# Patient Record
Sex: Male | Born: 1950
Health system: Southern US, Community
[De-identification: ages and names within clinical notes are randomized; demographics above are authoritative.]

## PROBLEM LIST (undated history)

## (undated) ENCOUNTER — Emergency Department (HOSPITAL_COMMUNITY): Admission: EM | Payer: Medicare HMO | Source: Home / Self Care

## (undated) DIAGNOSIS — K219 Gastro-esophageal reflux disease without esophagitis: Secondary | ICD-10-CM

## (undated) DIAGNOSIS — E7800 Pure hypercholesterolemia, unspecified: Secondary | ICD-10-CM

## (undated) DIAGNOSIS — E119 Type 2 diabetes mellitus without complications: Secondary | ICD-10-CM

## (undated) DIAGNOSIS — E785 Hyperlipidemia, unspecified: Secondary | ICD-10-CM

## (undated) DIAGNOSIS — D126 Benign neoplasm of colon, unspecified: Secondary | ICD-10-CM

## (undated) DIAGNOSIS — I10 Essential (primary) hypertension: Secondary | ICD-10-CM

## (undated) DIAGNOSIS — M549 Dorsalgia, unspecified: Secondary | ICD-10-CM

## (undated) DIAGNOSIS — M109 Gout, unspecified: Secondary | ICD-10-CM

## (undated) DIAGNOSIS — G8929 Other chronic pain: Secondary | ICD-10-CM

## (undated) DIAGNOSIS — G4730 Sleep apnea, unspecified: Secondary | ICD-10-CM

## (undated) DIAGNOSIS — I2510 Atherosclerotic heart disease of native coronary artery without angina pectoris: Secondary | ICD-10-CM

## (undated) DIAGNOSIS — F419 Anxiety disorder, unspecified: Secondary | ICD-10-CM

## (undated) DIAGNOSIS — N281 Cyst of kidney, acquired: Secondary | ICD-10-CM

## (undated) DIAGNOSIS — G2581 Restless legs syndrome: Secondary | ICD-10-CM

## (undated) DIAGNOSIS — K449 Diaphragmatic hernia without obstruction or gangrene: Secondary | ICD-10-CM

## (undated) DIAGNOSIS — M1990 Unspecified osteoarthritis, unspecified site: Secondary | ICD-10-CM

## (undated) HISTORY — DX: Gout, unspecified: M10.9

## (undated) HISTORY — DX: Benign neoplasm of colon, unspecified: D12.6

## (undated) HISTORY — DX: Diaphragmatic hernia without obstruction or gangrene: K44.9

## (undated) HISTORY — DX: Other chronic pain: G89.29

## (undated) HISTORY — PX: VASECTOMY: SHX75

## (undated) HISTORY — DX: Pure hypercholesterolemia, unspecified: E78.00

## (undated) HISTORY — DX: Gastro-esophageal reflux disease without esophagitis: K21.9

## (undated) HISTORY — DX: Sleep apnea, unspecified: G47.30

## (undated) HISTORY — DX: Hyperlipidemia, unspecified: E78.5

## (undated) HISTORY — DX: Cyst of kidney, acquired: N28.1

## (undated) HISTORY — DX: Dorsalgia, unspecified: M54.9

## (undated) HISTORY — DX: Unspecified osteoarthritis, unspecified site: M19.90

## (undated) HISTORY — DX: Anxiety disorder, unspecified: F41.9

## (undated) HISTORY — DX: Essential (primary) hypertension: I10

---
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
## 1993-05-01 HISTORY — PX: KNEE SURGERY: SHX244

## 2002-05-01 HISTORY — PX: CARDIOVASCULAR STRESS TEST: SHX262

## 2005-03-02 ENCOUNTER — Ambulatory Visit: Payer: Self-pay | Admitting: Family Medicine

## 2005-03-22 ENCOUNTER — Ambulatory Visit: Payer: Self-pay | Admitting: Family Medicine

## 2005-05-11 ENCOUNTER — Ambulatory Visit: Payer: Self-pay | Admitting: Family Medicine

## 2005-06-15 ENCOUNTER — Ambulatory Visit: Payer: Self-pay | Admitting: Family Medicine

## 2005-08-31 ENCOUNTER — Ambulatory Visit: Payer: Self-pay | Admitting: Family Medicine

## 2005-11-08 ENCOUNTER — Ambulatory Visit: Payer: Self-pay | Admitting: Family Medicine

## 2005-12-26 ENCOUNTER — Ambulatory Visit: Payer: Self-pay | Admitting: Family Medicine

## 2006-02-06 DIAGNOSIS — G4700 Insomnia, unspecified: Secondary | ICD-10-CM | POA: Insufficient documentation

## 2006-02-06 DIAGNOSIS — E7800 Pure hypercholesterolemia, unspecified: Secondary | ICD-10-CM | POA: Insufficient documentation

## 2006-02-06 DIAGNOSIS — I10 Essential (primary) hypertension: Secondary | ICD-10-CM | POA: Insufficient documentation

## 2006-03-06 ENCOUNTER — Encounter: Payer: Self-pay | Admitting: Family Medicine

## 2006-03-06 ENCOUNTER — Ambulatory Visit: Payer: Self-pay | Admitting: Family Medicine

## 2006-06-06 ENCOUNTER — Telehealth: Payer: Self-pay | Admitting: Family Medicine

## 2006-06-13 ENCOUNTER — Ambulatory Visit: Payer: Self-pay | Admitting: Family Medicine

## 2006-06-14 ENCOUNTER — Telehealth (INDEPENDENT_AMBULATORY_CARE_PROVIDER_SITE_OTHER): Payer: Self-pay | Admitting: *Deleted

## 2006-06-14 LAB — CONVERTED CEMR LAB
ALT: 53 units/L (ref 0–53)
AST: 28 units/L (ref 0–37)
Alkaline Phosphatase: 58 units/L (ref 39–117)
Basophils Absolute: 0 10*3/uL (ref 0.0–0.1)
Basophils Relative: 0 % (ref 0–1)
Calcium: 9.7 mg/dL (ref 8.4–10.5)
Chloride: 102 meq/L (ref 96–112)
Creatinine, Ser: 0.88 mg/dL (ref 0.40–1.50)
Eosinophils Absolute: 0.1 10*3/uL (ref 0.0–0.7)
LDL Cholesterol: 110 mg/dL — ABNORMAL HIGH (ref 0–99)
Lipase: 25 units/L (ref 0–75)
MCHC: 32.3 g/dL (ref 30.0–36.0)
Monocytes Absolute: 0.5 10*3/uL (ref 0.2–0.7)
Neutro Abs: 8.2 10*3/uL — ABNORMAL HIGH (ref 1.7–7.7)
Neutrophils Relative %: 81 % — ABNORMAL HIGH (ref 43–77)
Potassium: 3.9 meq/L (ref 3.5–5.3)
RDW: 13.7 % (ref 11.5–14.0)
Total CHOL/HDL Ratio: 3.7
VLDL: 28 mg/dL (ref 0–40)

## 2006-07-23 ENCOUNTER — Telehealth: Payer: Self-pay | Admitting: Family Medicine

## 2006-07-31 HISTORY — PX: CHOLECYSTECTOMY: SHX55

## 2006-08-08 ENCOUNTER — Telehealth: Payer: Self-pay | Admitting: Family Medicine

## 2006-08-08 ENCOUNTER — Ambulatory Visit: Payer: Self-pay | Admitting: Family Medicine

## 2006-08-08 DIAGNOSIS — R1013 Epigastric pain: Secondary | ICD-10-CM | POA: Insufficient documentation

## 2006-08-09 ENCOUNTER — Telehealth: Payer: Self-pay | Admitting: Family Medicine

## 2006-08-09 ENCOUNTER — Encounter: Payer: Self-pay | Admitting: Family Medicine

## 2006-08-09 LAB — CONVERTED CEMR LAB
ALT: 73 units/L — ABNORMAL HIGH (ref 0–53)
Albumin: 4.4 g/dL (ref 3.5–5.2)
Basophils Absolute: 0 10*3/uL (ref 0.0–0.1)
CO2: 29 meq/L (ref 19–32)
Chloride: 99 meq/L (ref 96–112)
Glucose, Bld: 116 mg/dL — ABNORMAL HIGH (ref 70–99)
HCT: 44.6 % (ref 39.0–52.0)
Lymphocytes Relative: 14 % (ref 12–46)
Lymphs Abs: 1.8 10*3/uL (ref 0.7–3.3)
Neutro Abs: 9.6 10*3/uL — ABNORMAL HIGH (ref 1.7–7.7)
Neutrophils Relative %: 74 % (ref 43–77)
Platelets: 253 10*3/uL (ref 150–400)
Potassium: 3.4 meq/L — ABNORMAL LOW (ref 3.5–5.3)
RDW: 13.4 % (ref 11.5–14.0)
Sodium: 136 meq/L (ref 135–145)
Total Protein: 7.1 g/dL (ref 6.0–8.3)
WBC: 13 10*3/uL — ABNORMAL HIGH (ref 4.0–10.5)

## 2006-08-11 ENCOUNTER — Encounter (INDEPENDENT_AMBULATORY_CARE_PROVIDER_SITE_OTHER): Payer: Self-pay | Admitting: Specialist

## 2006-08-12 ENCOUNTER — Inpatient Hospital Stay (HOSPITAL_COMMUNITY): Admission: RE | Admit: 2006-08-12 | Discharge: 2006-08-13 | Payer: Self-pay | Admitting: General Surgery

## 2006-10-29 ENCOUNTER — Ambulatory Visit: Payer: Self-pay | Admitting: Family Medicine

## 2006-11-26 ENCOUNTER — Telehealth: Payer: Self-pay | Admitting: Family Medicine

## 2007-02-21 ENCOUNTER — Telehealth: Payer: Self-pay | Admitting: Family Medicine

## 2007-04-04 ENCOUNTER — Telehealth: Payer: Self-pay | Admitting: Family Medicine

## 2007-04-08 ENCOUNTER — Ambulatory Visit: Payer: Self-pay | Admitting: Family Medicine

## 2007-04-19 ENCOUNTER — Telehealth: Payer: Self-pay | Admitting: Family Medicine

## 2007-06-05 ENCOUNTER — Ambulatory Visit: Payer: Self-pay | Admitting: Family Medicine

## 2007-07-12 ENCOUNTER — Encounter: Payer: Self-pay | Admitting: Family Medicine

## 2007-09-04 ENCOUNTER — Ambulatory Visit: Payer: Self-pay | Admitting: Family Medicine

## 2008-01-16 ENCOUNTER — Ambulatory Visit: Payer: Self-pay | Admitting: Family Medicine

## 2008-01-16 DIAGNOSIS — R5383 Other fatigue: Secondary | ICD-10-CM

## 2008-01-16 DIAGNOSIS — R5381 Other malaise: Secondary | ICD-10-CM | POA: Insufficient documentation

## 2008-01-17 LAB — CONVERTED CEMR LAB
ALT: 31 units/L (ref 0–53)
Albumin: 4.5 g/dL (ref 3.5–5.2)
Alkaline Phosphatase: 51 units/L (ref 39–117)
CO2: 22 meq/L (ref 19–32)
Cholesterol: 181 mg/dL (ref 0–200)
Glucose, Bld: 94 mg/dL (ref 70–99)
LDL Cholesterol: 85 mg/dL (ref 0–99)
Lipase: 28 units/L (ref 0–75)
MCV: 84.2 fL (ref 78.0–100.0)
Platelets: 264 10*3/uL (ref 150–400)
Potassium: 3.8 meq/L (ref 3.5–5.3)
RBC: 5.57 M/uL (ref 4.22–5.81)
Sodium: 141 meq/L (ref 135–145)
TSH: 1.357 microintl units/mL (ref 0.350–4.50)
Total Bilirubin: 0.8 mg/dL (ref 0.3–1.2)
Total Protein: 7.1 g/dL (ref 6.0–8.3)
Triglycerides: 248 mg/dL — ABNORMAL HIGH (ref <150)
VLDL: 50 mg/dL — ABNORMAL HIGH (ref 0–40)
Vitamin B-12: 567 pg/mL (ref 211–911)
WBC: 6.3 10*3/uL (ref 4.0–10.5)

## 2008-01-22 ENCOUNTER — Encounter: Admission: RE | Admit: 2008-01-22 | Discharge: 2008-01-22 | Payer: Self-pay | Admitting: Family Medicine

## 2008-01-22 ENCOUNTER — Encounter: Payer: Self-pay | Admitting: Family Medicine

## 2008-02-04 ENCOUNTER — Telehealth: Payer: Self-pay | Admitting: Family Medicine

## 2008-05-01 HISTORY — PX: CARDIOVASCULAR STRESS TEST: SHX262

## 2008-05-28 ENCOUNTER — Telehealth: Payer: Self-pay | Admitting: Family Medicine

## 2008-06-24 ENCOUNTER — Ambulatory Visit: Payer: Self-pay | Admitting: Family Medicine

## 2008-06-24 ENCOUNTER — Encounter: Admission: RE | Admit: 2008-06-24 | Discharge: 2008-06-24 | Payer: Self-pay | Admitting: Family Medicine

## 2008-06-24 DIAGNOSIS — R0789 Other chest pain: Secondary | ICD-10-CM | POA: Insufficient documentation

## 2008-06-24 DIAGNOSIS — R1011 Right upper quadrant pain: Secondary | ICD-10-CM | POA: Insufficient documentation

## 2008-06-24 DIAGNOSIS — N281 Cyst of kidney, acquired: Secondary | ICD-10-CM | POA: Insufficient documentation

## 2008-06-24 DIAGNOSIS — R209 Unspecified disturbances of skin sensation: Secondary | ICD-10-CM | POA: Insufficient documentation

## 2008-06-24 DIAGNOSIS — R0989 Other specified symptoms and signs involving the circulatory and respiratory systems: Secondary | ICD-10-CM | POA: Insufficient documentation

## 2008-06-25 LAB — CONVERTED CEMR LAB
ALT: 30 U/L
AST: 22 U/L
Albumin: 4.5 g/dL
Alkaline Phosphatase: 52 U/L
BUN: 8 mg/dL
Basophils Absolute: 0 K/uL
Basophils Relative: 1 %
CO2: 23 meq/L
Calcium: 9.1 mg/dL
Chloride: 105 meq/L
Creatinine, Ser: 0.88 mg/dL
Eosinophils Absolute: 0.1 K/uL
Eosinophils Relative: 2 %
Glucose, Bld: 96 mg/dL
HCT: 46.4 %
Hemoglobin: 15.2 g/dL
Lymphocytes Relative: 30 %
Lymphs Abs: 2 K/uL
MCHC: 32.8 g/dL
MCV: 81.4 fL
Monocytes Absolute: 0.5 K/uL
Monocytes Relative: 7 %
Neutro Abs: 4 K/uL
Neutrophils Relative %: 61 %
Platelets: 287 K/uL
Potassium: 3.5 meq/L
RBC: 5.7 M/uL
RDW: 13.4 %
Sodium: 143 meq/L
TSH: 1.323 u[IU]/mL
Total Bilirubin: 0.6 mg/dL
Total Protein: 7.3 g/dL
WBC: 6.7 10*3/microliter

## 2008-06-29 ENCOUNTER — Telehealth: Payer: Self-pay | Admitting: Family Medicine

## 2008-06-30 ENCOUNTER — Ambulatory Visit (HOSPITAL_COMMUNITY): Admission: RE | Admit: 2008-06-30 | Discharge: 2008-06-30 | Payer: Self-pay | Admitting: Family Medicine

## 2008-07-07 ENCOUNTER — Ambulatory Visit: Payer: Self-pay | Admitting: Cardiology

## 2008-07-08 ENCOUNTER — Encounter: Payer: Self-pay | Admitting: Cardiology

## 2008-07-08 ENCOUNTER — Ambulatory Visit: Payer: Self-pay | Admitting: Cardiology

## 2008-07-08 ENCOUNTER — Ambulatory Visit (HOSPITAL_COMMUNITY): Admission: RE | Admit: 2008-07-08 | Discharge: 2008-07-08 | Payer: Self-pay | Admitting: Cardiology

## 2008-07-13 ENCOUNTER — Ambulatory Visit: Payer: Self-pay | Admitting: Cardiology

## 2009-01-15 ENCOUNTER — Ambulatory Visit: Payer: Self-pay | Admitting: Family Medicine

## 2009-01-15 LAB — CONVERTED CEMR LAB
Bilirubin Urine: NEGATIVE
Ketones, urine, test strip: NEGATIVE
Specific Gravity, Urine: 1.02
WBC Urine, dipstick: NEGATIVE
pH: 7

## 2009-01-16 ENCOUNTER — Encounter: Payer: Self-pay | Admitting: Family Medicine

## 2009-01-19 ENCOUNTER — Encounter: Payer: Self-pay | Admitting: Family Medicine

## 2009-01-19 LAB — CONVERTED CEMR LAB
ALT: 22 units/L (ref 0–53)
AST: 16 units/L (ref 0–37)
Calcium: 9 mg/dL (ref 8.4–10.5)
Chloride: 105 meq/L (ref 96–112)
Creatinine, Ser: 1.01 mg/dL (ref 0.40–1.50)
Folate: 17.2 ng/mL
PSA: 2.79 ng/mL (ref 0.10–4.00)
Sodium: 143 meq/L (ref 135–145)
Testosterone Free: 93.8 pg/mL (ref 47.0–244.0)
Testosterone-% Free: 2.3 % (ref 1.6–2.9)
Total Bilirubin: 0.6 mg/dL (ref 0.3–1.2)
Total Protein: 7.1 g/dL (ref 6.0–8.3)

## 2009-05-13 ENCOUNTER — Ambulatory Visit: Payer: Self-pay | Admitting: Family Medicine

## 2009-05-13 DIAGNOSIS — E559 Vitamin D deficiency, unspecified: Secondary | ICD-10-CM | POA: Insufficient documentation

## 2009-12-02 ENCOUNTER — Ambulatory Visit: Payer: Self-pay | Admitting: Family Medicine

## 2009-12-02 DIAGNOSIS — F341 Dysthymic disorder: Secondary | ICD-10-CM | POA: Insufficient documentation

## 2009-12-03 ENCOUNTER — Encounter: Payer: Self-pay | Admitting: Family Medicine

## 2009-12-03 LAB — CONVERTED CEMR LAB
AST: 24 units/L (ref 0–37)
Albumin: 4.3 g/dL (ref 3.5–5.2)
Alkaline Phosphatase: 55 units/L (ref 39–117)
Cholesterol, target level: 200 mg/dL
HDL goal, serum: 40 mg/dL
LDL Cholesterol: 132 mg/dL — ABNORMAL HIGH (ref 0–99)
LDL Goal: 100 mg/dL
Potassium: 3.7 meq/L (ref 3.5–5.3)
Sodium: 143 meq/L (ref 135–145)
TSH: 1.355 microintl units/mL (ref 0.350–4.500)
Total Protein: 7.1 g/dL (ref 6.0–8.3)

## 2009-12-16 ENCOUNTER — Telehealth: Payer: Self-pay | Admitting: Family Medicine

## 2010-03-09 ENCOUNTER — Telehealth: Payer: Self-pay | Admitting: Family Medicine

## 2010-03-10 ENCOUNTER — Encounter: Admission: RE | Admit: 2010-03-10 | Discharge: 2010-03-10 | Payer: Self-pay | Admitting: Family Medicine

## 2010-03-10 ENCOUNTER — Ambulatory Visit: Payer: Self-pay | Admitting: Family Medicine

## 2010-03-14 ENCOUNTER — Ambulatory Visit: Payer: Self-pay | Admitting: Family Medicine

## 2010-03-29 ENCOUNTER — Ambulatory Visit: Payer: Self-pay | Admitting: Family Medicine

## 2010-03-29 DIAGNOSIS — M549 Dorsalgia, unspecified: Secondary | ICD-10-CM | POA: Insufficient documentation

## 2010-03-31 ENCOUNTER — Encounter: Payer: Self-pay | Admitting: Family Medicine

## 2010-03-31 ENCOUNTER — Encounter: Admission: RE | Admit: 2010-03-31 | Discharge: 2010-03-31 | Payer: Self-pay | Admitting: Orthopedic Surgery

## 2010-04-14 ENCOUNTER — Encounter: Payer: Self-pay | Admitting: Family Medicine

## 2010-04-16 ENCOUNTER — Encounter
Admission: RE | Admit: 2010-04-16 | Discharge: 2010-04-16 | Payer: Self-pay | Source: Home / Self Care | Attending: Orthopedic Surgery | Admitting: Orthopedic Surgery

## 2010-05-04 ENCOUNTER — Ambulatory Visit
Admission: RE | Admit: 2010-05-04 | Discharge: 2010-05-04 | Payer: Self-pay | Source: Home / Self Care | Attending: Family Medicine | Admitting: Family Medicine

## 2010-05-17 ENCOUNTER — Telehealth: Payer: Self-pay | Admitting: Family Medicine

## 2010-05-20 ENCOUNTER — Encounter: Payer: Self-pay | Admitting: Family Medicine

## 2010-05-23 ENCOUNTER — Telehealth: Payer: Self-pay | Admitting: Family Medicine

## 2010-05-31 NOTE — Assessment & Plan Note (Signed)
Summary: HURTING ACROSS CHEST/BACK VERY SORE-VEW   Vital Signs:  Patient profile:   60 year old male Height:      69 inches Weight:      206 pounds Pulse rate:   58 / minute BP sitting:   160 / 100  (right arm) Cuff size:   regular  Vitals Entered By: Avon Gully CMA, Duncan Dull) (March 14, 2010 1:13 PM) CC: pain across the chest   Primary Care Provider:  Nani Gasser MD  CC:  pain across the chest.  History of Present Illness: Chest pain is Worse when rotatates to the sides.  his pain is about the same.  He does note it is worse with certain activities like raking leaves.  He denies any shortness of breath.  He also had a cardiac workup with stress testing in 2010. I did review his x-ray results with him.  We were unable to get him by phone.  Current Medications (verified): 1)  Ambien 10 Mg Tabs (Zolpidem Tartrate) .... Take 1 Tablet By Mouth Once A Day At Night As Needed For Insomnia 2)  Metoprolol Succinate 100 Mg Xr24h-Tab (Metoprolol Succinate) .... Take 1 Tablet By Mouth Once A Day 3)  Fexofenadine Hcl 180 Mg Tabs (Fexofenadine Hcl) .... Take 1 Tablet By Mouth Once A Day As Needed Allergies 4)  Nexium 40 Mg Cpdr (Esomeprazole Magnesium) .... Take 1 Tablet By Mouth Once A Day 5)  Tribenzor 40-5-25 Mg Tabs (Olmesartan-Amlodipine-Hctz) .... Take 1 Tablet By Mouth Once A Day 6)  Citalopram Hydrobromide 20 Mg Tabs (Citalopram Hydrobromide) .... 1/2 Tab By Mouth Daily For One Week, Then Incrase A Whole. 7)  Lipitor 20 Mg Tabs (Atorvastatin Calcium) .... Take 1 Tablet By Mouth Once A Day At Bedtime 8)  Tramadol Hcl 50 Mg Tabs (Tramadol Hcl) .... Take 1 Tablet By Mouth Once A Day At Bedtime As Needed Severe Pain  Allergies (verified): 1)  Zocor (Simvastatin)  Comments:  Nurse/Medical Assistant: The patient's medications and allergies were reviewed with the patient and were updated in the Medication and Allergy Lists. Avon Gully CMA, Duncan Dull) (March 14, 2010 1:13  PM)  Physical Exam  General:  Well-developed,well-nourished,in no acute distress; alert,appropriate and cooperative throughout examination Chest Wall:  TEnder over  th lower sternum and xiphoid process.  Lungs:  Normal respiratory effort, chest expands symmetrically. Lungs are clear to auscultation, no crackles or wheezes. Heart:  Normal rate and regular rhythm. S1 and S2 normal without gallop, murmur, click, rub or other extra sounds.   Impression & Recommendations:  Problem # 1:  COSTOCHONDRITIS (ICD-733.6) explained that this is likely costochondritis as his pain is exacerbated by rotation of the torso right and left.  I still think he may have some pain coming from his back as well as he also points to his left upper back.  I gave him a handout on costochondritis encouraged use of an anti-inflammatory.    If he is not continuing to get better then I'll refer him to sports medicine orthopedics and two to 3 weeks.  He will call the office at that time.  He had a normal cardiac exam and workup in 2010.  And has a normal cardiac exam today.  It is interesting that some of his pain is near his xiphoid process. He also has a surgical scar near here where his gallbladder was removed.   scar tissue could also be causing some of his discomfort.  Problem # 2:  HYPERTENSION, BENIGN SYSTEMIC (ICD-401.1) BP  148-177/103.  whis blood pressure is still not well controlled.  He has not had a chance to pick up his Tribenzor or if he's been out of 10.  He says he will pick this up and follow up in one month to recheck his blood pressure. His updated medication list for this problem includes:    Metoprolol Succinate 100 Mg Xr24h-tab (Metoprolol succinate) .Marland Kitchen... Take 1 tablet by mouth once a day    Tribenzor 40-5-25 Mg Tabs (Olmesartan-amlodipine-hctz) .Marland Kitchen... Take 1 tablet by mouth once a day  Complete Medication List: 1)  Ambien 10 Mg Tabs (Zolpidem tartrate) .... Take 1 tablet by mouth once a day at night as  needed for insomnia 2)  Metoprolol Succinate 100 Mg Xr24h-tab (Metoprolol succinate) .... Take 1 tablet by mouth once a day 3)  Fexofenadine Hcl 180 Mg Tabs (Fexofenadine hcl) .... Take 1 tablet by mouth once a day as needed allergies 4)  Nexium 40 Mg Cpdr (Esomeprazole magnesium) .... Take 1 tablet by mouth once a day 5)  Tribenzor 40-5-25 Mg Tabs (Olmesartan-amlodipine-hctz) .... Take 1 tablet by mouth once a day 6)  Citalopram Hydrobromide 20 Mg Tabs (Citalopram hydrobromide) .... 1/2 tab by mouth daily for one week, then incrase a whole. 7)  Lipitor 20 Mg Tabs (Atorvastatin calcium) .... Take 1 tablet by mouth once a day at bedtime 8)  Tramadol Hcl 50 Mg Tabs (Tramadol hcl) .... Take 1 tablet by mouth once a day at bedtime as needed severe pain 9)  Diclofenac Sodium 75 Mg Tbec (Diclofenac sodium) .... Take 1 tablet by mouth two times a day for the chest pain   Patient Instructions: 1)  Lay off the salt.  Start the new BP med and recheck in one month. 2)  Can start the diclofenac for possible costochondritis. If not better in 2-3 weeeks then call me and I will set you up with orthopedics downstairs.  3)  Please schedule a follow-up appointment in 1 month to recheck your blood pressure.  Prescriptions: DICLOFENAC SODIUM 75 MG TBEC (DICLOFENAC SODIUM) Take 1 tablet by mouth two times a day for the chest pain  #60 x 0   Entered and Authorized by:   Nani Gasser MD   Signed by:   Nani Gasser MD on 03/14/2010   Method used:   Electronically to        [facility] [address]       [address], [place]  [address]       Ph: [phone]       Fax: [phone]   RxID:   [id number]    Orders Added: 1)  Est. Patient Level III [phone]

## 2010-05-31 NOTE — Progress Notes (Deleted)
Summary: BP Log & Med List Brought by Patient  BP Log & Med List Brought by Patient   Imported By: Lanelle Bal 03/22/2010 11:53:47  _____________________________________________________________________  External Attachment:    Type:   Image     Comment:   External Document

## 2010-05-31 NOTE — Consult Note (Signed)
Summary: [facility]   Imported By: Maryln Gottron 04/08/2010 14:33:44  _____________________________________________________________________  External Attachment:    Type:   Image     Comment:   External Document

## 2010-05-31 NOTE — Assessment & Plan Note (Signed)
Summary: F/U BP, depression   Vital Signs:  Patient profile:   60 year old male Height:      69 inches Weight:      202 pounds BMI:     29.94 Pulse rate:   59 / minute BP sitting:   149 / 86  (left arm) Cuff size:   regular  Vitals Entered By: Avon Gully CMA, Duncan Dull) (December 02, 2009 1:11 PM) CC: f/u BP   Primary Care Provider:  Nani Gasser MD  CC:  f/u BP.  History of Present Illness: Did buy a home BP machine and DBPs have been in the 90s and occ 100.  Tried increased tabs to 1.5 to see if helped his BP and it did bring it to the 80s. No swelling.   Has been feeling more down and anxious.  Wonders if a medicine will help him.  Home life has been stressful. Will start to sweat profusely when he feels anxious.  Getting more frquent HA.  Says costantly feels wound up.  Has trouble concentratint. Says even his wife has commented that he is too anxious.  He says has felt a litlle more down as well. No thoughtsof hurting himself.   Current Medications (verified): 1)  Ambien 10 Mg Tabs (Zolpidem Tartrate) .... Take 1 Tablet By Mouth Once A Day At Night As Needed For Insomnia 2)  Metoprolol Succinate 100 Mg Xr24h-Tab (Metoprolol Succinate) .... Take 1 Tablet By Mouth Once A Day 3)  Fexofenadine Hcl 180 Mg Tabs (Fexofenadine Hcl) .... Take 1 Tablet By Mouth Once A Day As Needed Allergies 4)  Nexium 40 Mg Cpdr (Esomeprazole Magnesium) .... Take 1 Tablet By Mouth Once A Day 5)  Losartan Potassium-Hctz 100-25 Mg Tabs (Losartan Potassium-Hctz) .... Take 1 Tablet By Mouth Once A Day in The Am.  Allergies (verified): No Known Drug Allergies  Comments:  Nurse/Medical Assistant: The patient's medications and allergies were reviewed with the patient and were updated in the Medication and Allergy Lists. Avon Gully CMA, Duncan Dull) (December 02, 2009 1:12 PM)  Physical Exam  General:  Well-developed,well-nourished,in no acute distress; alert,appropriate and cooperative throughout  examination Head:  Normocephalic and atraumatic without obvious abnormalities. No apparent alopecia or balding. Lungs:  Normal respiratory effort, chest expands symmetrically. Lungs are clear to auscultation, no crackles or wheezes. Heart:  Normal rate and regular rhythm. S1 and S2 normal without gallop, murmur, click, rub or other extra sounds. Skin:  no rashes.   Psych:  Cognition and judgment appear intact. Alert and cooperative with normal attention span and concentration. No apparent delusions, illusions, hallucinations   Impression & Recommendations:  Problem # 1:  HYPERTENSION, BENIGN SYSTEMIC (ICD-401.1) Not at goal.  Will start trial of Tribenzor  40/5/25mg . Stop the losartan and continue the metoprolol. F/u in 6 weeks.  Due for labs to check potassium, kidneys, etc.  His updated medication list for this problem includes:    Metoprolol Succinate 100 Mg Xr24h-tab (Metoprolol succinate) .Marland Kitchen... Take 1 tablet by mouth once a day    Losartan Potassium-hctz 100-25 Mg Tabs (Losartan potassium-hctz) .Marland Kitchen... Take 1 tablet by mouth once a day in the am.  Orders: T-Comprehensive Metabolic Panel [phone]) T-Lipid Profile ([id number]) T-TSH ([id number])  Problem # 2:  ANXIETY DEPRESSION (ICD-300.4) Assessment: New PHQ09 score of 18 (moderatly severe).  GAD-7 score of 13.  Discussed treatment with posible medications and counseling. He is open to trying medicaiton. Dsicussed SSRI use and potential SE including inc suicide risk. STart med  and f/u in 4-6 weeks. No suicidal thought.     Complete Medication List: 1)  Ambien 10 Mg Tabs (Zolpidem tartrate) .... Take 1 tablet by mouth once a day at night as needed for insomnia 2)  Metoprolol Succinate 100 Mg Xr24h-tab (Metoprolol succinate) .... Take 1 tablet by mouth once a day 3)  Fexofenadine Hcl 180 Mg Tabs (Fexofenadine hcl) .... Take 1 tablet by mouth once a day as needed allergies 4)  Nexium 40 Mg Cpdr (Esomeprazole magnesium) .... Take 1  tablet by mouth once a day 5)  Losartan Potassium-hctz 100-25 Mg Tabs (Losartan potassium-hctz) .... Take 1 tablet by mouth once a day in the am. 6)  Citalopram Hydrobromide 20 Mg Tabs (Citalopram hydrobromide) .... 1/2 tab by mouth daily for one week, then incrase a whole.  Patient Instructions: 1)  Please schedule a follow-up appointment in 6 weeks for mood.  Prescriptions: CITALOPRAM HYDROBROMIDE 20 MG TABS (CITALOPRAM HYDROBROMIDE) 1/2 tab by mouth daily for one week, then incrase a whole.  #30 x 1   Entered and Authorized by:   Nani Gasser MD   Signed by:   Nani Gasser MD on 12/02/2009   Method used:   [facility] to        [facility] [address]       [address], [place]  [address]       Ph: [phone]       Fax: [phone]   RxID:   [id number]

## 2010-05-31 NOTE — Assessment & Plan Note (Signed)
Summary: BP issue and back pain    Vital Signs:  Patient profile:   60 year old male Height:      69 inches Weight:      201 pounds Pulse rate:   66 / minute BP sitting:   169 / 93  (right arm) Cuff size:   regular  Vitals Entered By: Avon Gully CMA, (AAMA) (March 10, 2010 11:30 AM) CC: f/u BP   Primary Care Provider:  Nani Gasser MD  CC:  f/u BP.  History of Present Illness: he complains of chest pain that seems like it is radiating from the side of the ribs towards the center of the chest.  He notes occasional tenderness over the center of the chest as well. Worse with deep breath and sneeze.. Had a normal stress test in 2010. says he has some low grade pain all the time in his upper and lower back.Stil having painin the lumbar spine.  Old injury at the upper thoracic spine.  If does alot of stooping and bending then has twinges in his back and it will occ take his breath away. Hx of reflux.  He does use his PPI fairly regularly.  There he says he does decrease usage when he feels his symptoms are well controlled.  no abdominal pain.  Says occasionally the pain will take his breath away but otherwise no shortness of breath.  He has not been taking any pain relievers are intact laboratories recently though yesterday he did take a tramadol  which did help his pain.  Current Medications (verified): 1)  Ambien 10 Mg Tabs (Zolpidem Tartrate) .... Take 1 Tablet By Mouth Once A Day At Night As Needed For Insomnia 2)  Metoprolol Succinate 100 Mg Xr24h-Tab (Metoprolol Succinate) .... Take 1 Tablet By Mouth Once A Day 3)  Fexofenadine Hcl 180 Mg Tabs (Fexofenadine Hcl) .... Take 1 Tablet By Mouth Once A Day As Needed Allergies 4)  Nexium 40 Mg Cpdr (Esomeprazole Magnesium) .... Take 1 Tablet By Mouth Once A Day 5)  Losartan Potassium-Hctz 100-25 Mg Tabs (Losartan Potassium-Hctz) .... Take 1 Tablet By Mouth Once A Day in The Am. 6)  Citalopram Hydrobromide 20 Mg Tabs (Citalopram  Hydrobromide) .... 1/2 Tab By Mouth Daily For One Week, Then Incrase A Whole. 7)  Lipitor 20 Mg Tabs (Atorvastatin Calcium) .... Take 1 Tablet By Mouth Once A Day At Bedtime  Allergies (verified): 1)  Zocor (Simvastatin)  Comments:  Nurse/Medical Assistant: The patient's medications and allergies were reviewed with the patient and were updated in the Medication and Allergy Lists. 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  Married to [employer].  Has 3 children 33, 34, 36.  Never smoked.  2 EtOH drinks per week, no drugs, 1 caffeinated drink.  No  regular exercise.  Lives in [place]  Physical Exam  General:  Well-developed,well-nourished,in no acute distress; alert,appropriate and cooperative throughout examination Lungs:  Normal respiratory effort, chest expands symmetrically. Lungs are clear to auscultation, no crackles or wheezes. Heart:  Normal rate and regular rhythm. S1 and S2 normal without gallop, murmur, click, rub or other extra sounds. Msk:  normal flexion and extension and rotation of the lumbar spine.  Shoulders with normal range of  motion.  There he says it gets a little uncomfortable in his upper back.  Nontender along the cervical thoracic spine.  He is mildly tender over the lower lumbar spine.   Impression & Recommendations:  Problem # 1:  PARESTHESIA (ICD-782.0) I really think his anterior chest pain radiating from the lateral rib area could be from his spine.  Especially when he says it hurts worse with sneezing and coughing.  This is consistent with a nerve impingement type injury.  He has had a normal cardiac workup about a year and a half ago and was having somewhat similar  pain 10.  I would like to get x-rays of the thoracic spine.  He is still having some discomfort in the lumbar spine but I am more concerned about this current time chest discomfort.  He does take his PPI fairly regularly so I do not believe this is reflux related. Orders: T-DG Thoracic Spine 3 Views [id number])  Problem # 2:  HYPERTENSION, BENIGN SYSTEMIC (ICD-401.1) not well controlled.  Change to losartan HCT to try benzo to Tribenzor.  Continue with beta-blocker. follow-up in one month. His updated medication list for this problem includes:    Metoprolol Succinate 100 Mg Xr24h-tab (Metoprolol succinate) .Marland Kitchen... Take 1 tablet by mouth once a day    Tribenzor 40-5-25 Mg Tabs (Olmesartan-amlodipine-hctz) .Marland Kitchen... Take 1 tablet by mouth once a day  Complete Medication List: 1)  Ambien 10 Mg Tabs (Zolpidem tartrate) .... Take 1 tablet by mouth once a day at night as needed for insomnia 2)  Metoprolol Succinate 100 Mg Xr24h-tab (Metoprolol succinate) .... Take 1 tablet by mouth once a day 3)  Fexofenadine Hcl 180 Mg Tabs (Fexofenadine hcl) .... Take 1 tablet by mouth once a day as needed allergies 4)  Nexium 40 Mg Cpdr (Esomeprazole magnesium) .... Take 1 tablet by mouth once a day 5)  Tribenzor 40-5-25 Mg Tabs (Olmesartan-amlodipine-hctz) .... Take 1 tablet by mouth once a day 6)  Citalopram Hydrobromide 20 Mg Tabs (Citalopram hydrobromide) .... 1/2 tab by mouth daily for one week, then incrase a whole. 7)  Lipitor 20 Mg Tabs (Atorvastatin calcium) .... Take 1 tablet by mouth once a day at bedtime Prescriptions: TRIBENZOR 40-5-25 MG TABS (OLMESARTAN-AMLODIPINE-HCTZ) Take 1 tablet by mouth once a day  #30 x 1   Entered and Authorized by:   Nani Gasser MD   Signed by:   Nani Gasser MD on 03/10/2010   Method used:   [facility] [address]       [address] [place]  [address]       Ph: [phone]       Fax: [phone]   RxID:    [id number]    Orders Added: 1)  T-DG Thoracic Spine 3 Views [72072] 2)  Est. Patient Level IV [[address]]  Appended Document: BP issue and back pain    Appended Document: BP issue and back pain  Prescriptions: LIPITOR 20 MG TABS (ATORVASTATIN CALCIUM) Take 1 tablet by mouth once a day at bedtime  #30 x 6   Entered and Authorized by:   Nani Gasser MD   Signed by:   Nani Gasser MD on 03/10/2010   Method used:   [facility] [address]       [address]  [place], [place]  [address]       Ph: [phone]       Fax: [phone]   RxID:   [id number] FEXOFENADINE HCL 180 MG TABS (FEXOFENADINE HCL) Take 1 tablet by mouth once a day as needed allergies  #30 Tablet x 6   Entered and Authorized by:   Nani Gasser MD   Signed by:   Nani Gasser MD on 03/10/2010   Method used:   [facility] [address]       [address] [place]  [address]       Ph: [phone]       Fax: [phone]   RxID:   [id number]

## 2010-05-31 NOTE — Assessment & Plan Note (Signed)
Summary: Back Pain    Vital Signs:  Patient profile:   60 year old male Height:      69 inches Weight:      204 pounds Pulse rate:   72 / minute BP sitting:   140 / 87  (right arm) Cuff size:   regular  Vitals Entered By: Avon Gully CMA, Duncan Dull) (March 29, 2010 3:29 PM) CC: mid to lower back pain that radiates across the back   Primary Care Maxima Skelton:  Nani Gasser MD  CC:  mid to lower back pain that radiates across the back.  History of Present Illness:  patient says that he has had back pain over several years.  He is primarily just decided to live with it until last several months in which case it has been getting worse.  He notes that there is a sore and tender area in his mid thoracic spine as well as a place in his lumbar spine.  He says it is worse with sitting for prolonged periods and with bending over or frequently.  He says he may have injured his upper back several years ago when he had a fall where he his head jerked forward.  He had also complained of pain over the site 4 at process area.  We had tried a PPI for this and it did not help.  And I last saw him I felt it could be costochondritis and gave him an anti-inflammatory.  He was unclear if this was related to his back or not.  Now he is more concerned about his back pain.  He says it does feel like a pressure sensation in from his mid back around to the front of his upper abdomen.  He says sometimes it's on Simona Huh take a deep breath.  He also notes occasionally feeling some numbness down his left leg to his foot.  Last week he felt his back pain became severe.  He did also have some ear stuffiness and sore throat.  He went to the emergency department but they had three half hour wait so he decided to leave.  He was never seen.  His wife had some leftover doxycycline and he decided to take about 6 tabs of this.  His ear and throat feel much better.  He also had a heaviness and wheezing in his chest he says has  resolved as well.  he had fairly normal thoracic spine films a couple of weeks ago.  It does not show the entire long discussion most of it which appears normal.  He also had a cardiac workup back in 2010 which was normal.  Current Medications (verified): 1)  Ambien 10 Mg Tabs (Zolpidem Tartrate) .... Take 1 Tablet By Mouth Once A Day At Night As Needed For Insomnia 2)  Metoprolol Succinate 100 Mg Xr24h-Tab (Metoprolol Succinate) .... Take 1 Tablet By Mouth Once A Day 3)  Fexofenadine Hcl 180 Mg Tabs (Fexofenadine Hcl) .... Take 1 Tablet By Mouth Once A Day As Needed Allergies 4)  Nexium 40 Mg Cpdr (Esomeprazole Magnesium) .... Take 1 Tablet By Mouth Once A Day 5)  Tribenzor 40-5-25 Mg Tabs (Olmesartan-Amlodipine-Hctz) .... Take 1 Tablet By Mouth Once A Day 6)  Citalopram Hydrobromide 20 Mg Tabs (Citalopram Hydrobromide) .... 1/2 Tab By Mouth Daily For One Week, Then Incrase A Whole. 7)  Lipitor 20 Mg Tabs (Atorvastatin Calcium) .... Take 1 Tablet By Mouth Once A Day At Bedtime 8)  Tramadol Hcl 50 Mg Tabs (Tramadol Hcl) .Marland KitchenMarland KitchenMarland Kitchen  Take 1 Tablet By Mouth Once A Day At Bedtime As Needed Severe Pain 9)  Diclofenac Sodium 75 Mg Tbec (Diclofenac Sodium) .... Take 1 Tablet By Mouth Two Times A Day For The Chest Pain  Allergies (verified): 1)  Zocor (Simvastatin)  Comments:  Nurse/Medical Assistant: The patient's medications and allergies were reviewed with the patient and were updated in the Medication and Allergy Lists. Avon Gully CMA, Duncan Dull) (March 29, 2010 3:30 PM)  Physical Exam  General:  Well-developed,well-nourished,in no acute distress; alert,appropriate and cooperative throughout examination Msk:  he does have some tenderness over the midthoracic spine.  He also has a little bit of mild tenderness over the lower lumbar spine.  I did not do any further examine his back.  Please see previous notes.   Impression & Recommendations:  Problem # 1:  BACK PAIN (ICD-724.5) he has had  thoracic and lumbar pain.  He also has what sounds like radicular symptoms on the left.  We discussed the options of getting an orthopedic consult versus going ahead and doing an MRI.  The unit he has had pain for several years this point has been progressively getting worse and he no longer is responding to anti-inflammatories and Tylenol cold refer him to orthopedics for further evaluation.  I did go ahead and give him his first prescription for hydrocodone for p.r.n. use.  I did warn him about sedation. His updated medication list for this problem includes:    Hydrocodone-acetaminophen 5-325 Mg Tabs (Hydrocodone-acetaminophen) ..... One by mouth every 4- 6 hours as needed for severe pain    Diclofenac Sodium 75 Mg Tbec (Diclofenac sodium) .Marland Kitchen... Take 1 tablet by mouth two times a day for the chest pain  Orders: Orthopedic Referral (Ortho)  Complete Medication List: 1)  Ambien 10 Mg Tabs (Zolpidem tartrate) .... Take 1 tablet by mouth once a day at night as needed for insomnia 2)  Metoprolol Succinate 100 Mg Xr24h-tab (Metoprolol succinate) .... Take 1 tablet by mouth once a day 3)  Fexofenadine Hcl 180 Mg Tabs (Fexofenadine hcl) .... Take 1 tablet by mouth once a day as needed allergies 4)  Nexium 40 Mg Cpdr (Esomeprazole magnesium) .... Take 1 tablet by mouth once a day 5)  Tribenzor 40-5-25 Mg Tabs (Olmesartan-amlodipine-hctz) .... Take 1 tablet by mouth once a day 6)  Citalopram Hydrobromide 20 Mg Tabs (Citalopram hydrobromide) .... 1/2 tab by mouth daily for one week, then incrase a whole. 7)  Lipitor 20 Mg Tabs (Atorvastatin calcium) .... Take 1 tablet by mouth once a day at bedtime 8)  Hydrocodone-acetaminophen 5-325 Mg Tabs (Hydrocodone-acetaminophen) .... One by mouth every 4- 6 hours as needed for severe pain 9)  Diclofenac Sodium 75 Mg Tbec (Diclofenac sodium) .... Take 1 tablet by mouth two times a day for the chest pain Prescriptions: HYDROCODONE-ACETAMINOPHEN 5-325 MG TABS  (HYDROCODONE-ACETAMINOPHEN) one by mouth every 4- 6 hours as needed for severe pain  #45 x 0   Entered and Authorized by:   Nani Gasser MD   Signed by:   Nani Gasser MD on 03/29/2010   Method used:   Print then Give to Patient   RxID:   [id number]    Orders Added: 1)  Orthopedic Referral [Ortho] 2)  Est. Patient Level III [86578]

## 2010-05-31 NOTE — Assessment & Plan Note (Signed)
Summary: HIGH BP   Vital Signs:  Patient profile:   60 year old male Height:      69 inches Weight:      207 pounds Pulse rate:   54 / minute BP sitting:   156 / 87  (left arm) Cuff size:   large  Vitals Entered By: Kathlene November (May 13, 2009 10:51 AM) CC: follow-up BP, Hypertension Management   Primary Care Provider:  Nani Gasser MD  CC:  follow-up BP and Hypertension Management.  History of Present Illness: Occ exercise but not consistant. Admits has not gained weight.    Hypertension History:      He notes no problems with any antihypertensive medication side effects.  Didn't take his medication this AM.  About 2 weeks ago ankles were very swollena dn felt light headed that day.  Checkec BP on a friends monitor and it was 142/120.  Had taken his meds that day. Had eaten alot of salt that weekend.  Took a friends Nitro and that helped adn his BP went down.  Will get tenderness in his temples. .        Positive major cardiovascular risk factors include male age 94 years old or older, hyperlipidemia, and hypertension.  Negative major cardiovascular risk factors include no history of diabetes, negative family history for ischemic heart disease, and non-tobacco-user status.        Further assessment for target organ damage reveals no history of ASHD, cardiac end-organ damage (CHF/LVH), stroke/TIA, peripheral vascular disease, renal insufficiency, or hypertensive retinopathy.     Current Medications (verified): 1)  Ambien 10 Mg Tabs (Zolpidem Tartrate) .... Take 1 Tablet By Mouth Once A Day At Night As Needed For Insomnia 2)  Metoprolol Succinate 100 Mg Xr24h-Tab (Metoprolol Succinate) .... Take 1 Tablet By Mouth Once A Day 3)  Fexofenadine Hcl 180 Mg Tabs (Fexofenadine Hcl) .... Take 1 Tablet By Mouth Once A Day As Needed Allergies 4)  Proair Hfa 108 (90 Base) Mcg/act  Aers (Albuterol Sulfate) .... 2 Puffs 4 X A Day X 1 Wk 5)  Blood Pressure Monitor  Misc (Misc. Devices)  .... Dx: Uncontrolled Hypertension 6)  Nexium 40 Mg Cpdr (Esomeprazole Magnesium) .... Take 1 Tablet By Mouth Once A Day 7)  Losartan Potassium 100 Mg Tabs (Losartan Potassium) .... Take One Tablet By Mouth Once A Day 8)  Zestoretic 20-12.5 Mg Tabs (Lisinopril-Hydrochlorothiazide) .... Take One Tablet By Mouth Twice A Day  Allergies (verified): No Known Drug Allergies  Comments:  Nurse/Medical Assistant: The patient's medications and allergies were reviewed with the patient and were updated in the Medication and Allergy Lists. Kathlene November (May 13, 2009 10:51 AM)  Physical Exam  General:  Well-developed,well-nourished,in no acute distress; alert,appropriate and cooperative throughout examination Head:  Normocephalic and atraumatic without obvious abnormalities. No apparent alopecia or balding. Eyes:  No corneal or conjunctival inflammation noted. EOMI. Perrla.  Lungs:  Normal respiratory effort, chest expands symmetrically. Lungs are clear to auscultation, no crackles or wheezes. Heart:  Normal rate and regular rhythm. S1 and S2 normal without gallop, murmur, click, rub or other extra sounds. No cartoid bruits.  Skin:  no rashes.   Psych:  Cognition and judgment appear intact. Alert and cooperative with normal attention span and concentration. No apparent delusions, illusions, hallucinations   Impression & Recommendations:  Problem # 1:  HYPERTENSION, BENIGN SYSTEMIC (ICD-401.1) Assessment Deteriorated  Strongly encouraged him to get his own BP monitor.   Really work on decreasing teh salt  in his diet and weight loss.  He is still gaining weight.   Also will adjust his medications. Will get rid of ACE and ARB combo and increase his diuretic because of not real benefit and possible risk with this combo.  Reviewed DASH diet. Call me in 2 weeks with home BPs so we can adjust his BP meds. Then fu in the office in 2 months. We are limited with his low pulse so really hesitate to add a  calcium channel blocker.   The following medications were removed from the medication list:    Zestoretic 20-12.5 Mg Tabs (Lisinopril-hydrochlorothiazide) .Marland Kitchen... Take one tablet by mouth twice a day His updated medication list for this problem includes:    Metoprolol Succinate 100 Mg Xr24h-tab (Metoprolol succinate) .Marland Kitchen... Take 1 tablet by mouth once a day    Losartan Potassium-hctz 100-25 Mg Tabs (Losartan potassium-hctz) .Marland Kitchen... Take 1 tablet by mouth once a day in the am.  BP today: 156/87 Prior BP: 132/77 (01/15/2009)  Prior 10 Yr Risk Heart Disease: 18 % (09/04/2007)  Labs Reviewed: K+: 3.7 (01/16/2009) Creat: : 1.01 (01/16/2009)   Chol: 181 (01/16/2008)   HDL: 46 (01/16/2008)   LDL: 85 (01/16/2008)   TG: 248 (01/16/2008)  Orders: T-Comprehensive Metabolic Panel ([id number]) T-TSH ([id number])  Complete Medication List: 1)  Ambien 10 Mg Tabs (Zolpidem tartrate) .... Take 1 tablet by mouth once a day at night as needed for insomnia 2)  Metoprolol Succinate 100 Mg Xr24h-tab (Metoprolol succinate) .... Take 1 tablet by mouth once a day 3)  Fexofenadine Hcl 180 Mg Tabs (Fexofenadine hcl) .... Take 1 tablet by mouth once a day as needed allergies 4)  Proair Hfa 108 (90 Base) Mcg/act Aers (Albuterol sulfate) .... 2 puffs 4 x a day x 1 wk 5)  Nexium 40 Mg Cpdr (Esomeprazole magnesium) .... Take 1 tablet by mouth once a day 6)  Losartan Potassium-hctz 100-25 Mg Tabs (Losartan potassium-hctz) .... Take 1 tablet by mouth once a day in the am.  Other Orders: T-Lipid Profile ([id number]) T-Vitamin D (25-Hydroxy) [phone])  Hypertension Assessment/Plan:      The patient's hypertensive risk group is category B: At least one risk factor (excluding diabetes) with no target organ damage.  His calculated 10 year risk of coronary heart disease is 9 %.  Today's blood pressure is 156/87.    Patient Instructions: 1)  DASH diet (.nih/gov).  2)  Really lower your salt intake.  3)  Follow up in  2 months for blood pressure.  4)  Work on weight.  Getting regular excercise.  Prescriptions: LOSARTAN POTASSIUM-HCTZ 100-25 MG TABS (LOSARTAN POTASSIUM-HCTZ) Take 1 tablet by mouth once a day in the AM.  #30 x 0   Entered and Authorized by:   Nani Gasser MD   Signed by:   Nani Gasser MD on 05/13/2009   Method used:   [facility]       [address], [place]  [address]       Ph: [phone]       Fax: [phone]   RxID:   [id number]

## 2010-05-31 NOTE — Progress Notes (Signed)
  Phone Note Call from Patient   Caller: Spouse Call For: Nani Gasser MD Summary of Call: pt wants a [facility] on [facility]ien [facility] in [place] ,[place] Initial call taken by: Avon Gully CMA, Duncan Dull),  December 16, 2009 3:47 PM    Prescriptions: AMBIEN 10 MG TABS (ZOLPIDEM TARTRATE) Take 1 tablet by mouth once a day at night as needed for insomnia  #30 x 1   Entered and Authorized by:   Nani Gasser MD   Signed by:   Nani Gasser MD on 12/16/2009   Method used:   Printed then faxed to ...       [facility]       [address] [place]  [address]       Ph: [phone]       Fax: [phone]   RxID:   [id number]

## 2010-05-31 NOTE — Progress Notes (Signed)
Summary: chest pain  Phone Note Call from Patient   Caller: Patient Call For: Nani Gasser MD Summary of Call: Pt called and states he feels pressure in his chest and it feels sore when you press on it and pain radiates around to his back. Pt states sometimes he feels SOB and starts to sweat and this has lasted all week. Pt scheduled an appt for Friday at 4. Spoke to Dr. Cathey Endow about his sx and upon her advise advised pt to come to the urgent care now. Pt then states he just took his BP and it was 187/92. Pt seemed hesitant to go to the urgent care. 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 Advised pt that in order to make sure he is ok he needs to be evaluated today and he needs to go to the urgent care now. Pt voiced understanding and states he would go Initial call taken by: Avon Gully CMA, Duncan Dull),  March 09, 2010 2:50 PM

## 2010-06-02 NOTE — Progress Notes (Signed)
Summary: Refill on pain med- jr   Phone Note Refill Request Message from:  Patient on May 17, 2010 4:11 PM  Refills Requested: Medication #1:  HYDROCODONE-ACETAMINOPHEN 5-325 MG TABS one by mouth every 4- 6 hours as needed for severe pain Pt states that he needs this refilled because of his back pain and he goes to ortho for his consult on Thursday 05/19/10...  Initial call taken by: Michaelle Copas,  May 17, 2010 4:12 PM    Prescriptions: HYDROCODONE-ACETAMINOPHEN 5-325 MG TABS (HYDROCODONE-ACETAMINOPHEN) one by mouth every 4- 6 hours as needed for severe pain  #45 x 0   Entered and Authorized by:   Nani Gasser MD   Signed by:   Nani Gasser MD on 05/17/2010   Method used:   Printed then faxed to ...       [facility]       [address], [place]  [address]       Ph: [phone]       Fax: [phone]   RxID:   [id number]

## 2010-06-02 NOTE — Letter (Signed)
Summary: [facility]   Imported By: Lanelle Bal 04/27/2010 12:32:46  _____________________________________________________________________  External Attachment:    Type:   Image     Comment:   External Document

## 2010-06-02 NOTE — Assessment & Plan Note (Addendum)
Summary: cough, ST   Vital Signs:  Patient profile:   60 year old male Height:      69 inches Weight:      206 pounds Temp:     98.5 degrees F oral Pulse rate:   70 / minute BP sitting:   132 / 81  (right arm) Cuff size:   regular  Vitals Entered By: Avon Gully CMA, Duncan Dull) (May 04, 2010 9:27 AM) CC: sore throat since last thurs,ears feel full,congestion in the am   Primary Care Provider:  Nani Gasser MD  CC:  sore throat since last thurs, ears feel full, and congestion in the am.  History of Present Illness: sore throat since last thurs,ears feel full,congestion in the am. Taking Airborne and garling. Wife had it. Has alot of heavy phlegm during the daytime.  No fever.  Now feeling worse. No SOB. Vertigo and lightheaded. No other medicines. Cough is not keeping him up at night.  + HA and pressur in his temples. Wife was sick first. Says she finally had to get an ABX and now she is better.   Current Medications (verified): 1)  Ambien 10 Mg Tabs (Zolpidem Tartrate) .... Take 1 Tablet By Mouth Once A Day At Night As Needed For Insomnia 2)  Metoprolol Succinate 100 Mg Xr24h-Tab (Metoprolol Succinate) .... Take 1 Tablet By Mouth Once A Day 3)  Fexofenadine Hcl 180 Mg Tabs (Fexofenadine Hcl) .... Take 1 Tablet By Mouth Once A Day As Needed Allergies 4)  Nexium 40 Mg Cpdr (Esomeprazole Magnesium) .... Take 1 Tablet By Mouth Once A Day 5)  Tribenzor 40-5-25 Mg Tabs (Olmesartan-Amlodipine-Hctz) .... Take 1 Tablet By Mouth Once A Day 6)  Hydrocodone-Acetaminophen 5-325 Mg Tabs (Hydrocodone-Acetaminophen) .... One By Mouth Every 4- 6 Hours As Needed For Severe Pain  Allergies (verified): 1)  Zocor (Simvastatin)  Comments:  Nurse/Medical Assistant: The patient's medications and allergies were reviewed with the patient and were updated in the Medication and Allergy Lists. Avon Gully CMA, Duncan Dull) (May 04, 2010 9:28 AM)  Physical Exam  General:   Well-developed,well-nourished,in no acute distress; alert,appropriate and cooperative throughout examination Head:  Normocephalic and atraumatic without obvious abnormalities. No apparent alopecia or balding. Eyes:  No corneal or conjunctival inflammation noted. EOMI. Perrla.  Ears:  External ear exam shows no significant lesions or deformities.  Otoscopic examination reveals clear canals, tympanic membranes are intact bilaterally without bulging, retraction, inflammation or discharge. Hearing is grossly normal bilaterally. Nose:  External nasal examination shows no deformity or inflammation. Nasal mucosa are pink and moist without lesions or exudates. Mouth:  Op with red with cobblestoning. Clear exudate.  Neck:  No deformities, masses, or tenderness noted. Lungs:  normal respiratory effort.  Wheeze at the right base.  Otherwise clear .  Heart:  Normal rate and regular rhythm. S1 and S2 normal without gallop, murmur, click, rub or other extra sounds.   Impression & Recommendations:  Problem # 1:  BRONCHITIS- ACUTE (ICD-466.0) Expalined could be viral but since does have a slight wheeze will go ahead and treat. Call if not better in one week.  His updated medication list for this problem includes:    Doxycycline Hyclate 100 Mg Tabs (Doxycycline hyclate) .Marland Kitchen... Take 1 tablet by mouth two times a day for 10 days.  Complete Medication List: 1)  Ambien 10 Mg Tabs (Zolpidem tartrate) .... Take 1 tablet by mouth once a day at night as needed for insomnia 2)  Metoprolol Succinate 100 Mg  Xr24h-tab (Metoprolol succinate) .... Take 1 tablet by mouth once a day 3)  Fexofenadine Hcl 180 Mg Tabs (Fexofenadine hcl) .... Take 1 tablet by mouth once a day as needed allergies 4)  Nexium 40 Mg Cpdr (Esomeprazole magnesium) .... Take 1 tablet by mouth once a day 5)  Tribenzor 40-5-25 Mg Tabs (Olmesartan-amlodipine-hctz) .... Take 1 tablet by mouth once a day 6)  Hydrocodone-acetaminophen 5-325 Mg Tabs  (Hydrocodone-acetaminophen) .... One by mouth every 4- 6 hours as needed for severe pain 7)  Doxycycline Hyclate 100 Mg Tabs (Doxycycline hyclate) .... Take 1 tablet by mouth two times a day for 10 days.  Patient Instructions: 1)  Call if not better in a week.  Prescriptions: DOXYCYCLINE HYCLATE 100 MG TABS (DOXYCYCLINE HYCLATE) Take 1 tablet by mouth two times a day for 10 days.  #20 x 0   Entered and Authorized by:   Nani Gasser MD   Signed by:   Nani Gasser MD on 05/04/2010   Method used:   [facility]        [facility]       [address] [place]  [address]       Ph: [phone]       Fax: [phone]   RxID:   [id number]    Orders Added: 1)  Est. Patient Level III [84[address]]  Appended Document: cough, ST

## 2010-06-02 NOTE — Progress Notes (Signed)
Summary: Rx for machine parts  Phone Note Call from Patient   Caller: Patient Summary of Call: Pt states his last doctor wrote an Rx for replacement parts for his sleep apnea machine but since you are now his doctor the company needs new Rx for replacement parts. Please fax to [phone] Initial call taken by: Payton Spark CMA,  May 23, 2010 2:03 PM  Follow-up for Phone Call        tell him we got the order today for the CPAP supplies and we will fax it over.  Follow-up by: Nani Gasser MD,  May 23, 2010 5:51 PM

## 2010-06-29 ENCOUNTER — Telehealth: Payer: Self-pay | Admitting: Family Medicine

## 2010-06-30 ENCOUNTER — Ambulatory Visit: Payer: Private Health Insurance - Indemnity | Admitting: Family Medicine

## 2010-07-01 ENCOUNTER — Telehealth: Payer: Self-pay | Admitting: Family Medicine

## 2010-07-07 NOTE — Progress Notes (Signed)
Summary: Rx. refill   Phone Note Refill Request Message from:  Patient on July 01, 2010 4:45 PM  Refills Requested: Medication #1:  AMBIEN 10 MG TABS Take 1 tablet by mouth once a day at night as needed for insomnia Patient's wife-Thomas [name] has been 2x to the pharmacy to pick up his Thomas [name] and it is still not there. She would like for you to call her when this has been called in... Thomas [name] [phone]... Thanks.Michaelle Copas  July 01, 2010 4:46 PM   Next Appointment Scheduled: 07/08/10 Initial call taken by: Michaelle Copas,  July 01, 2010 4:46 PM  Follow-up for Phone Call        Pleaes call in Follow-up by: Nani Gasser MD,  July 01, 2010 4:49 PM  Additional Follow-up for Phone Call Additional follow up Details #1::        Patient spouse notified. Additional Follow-up by: Lucious Groves CMA,  July 01, 2010 4:54 PM    Prescriptions: AMBIEN 10 MG TABS (ZOLPIDEM TARTRATE) Take 1 tablet by mouth once a day at night as needed for insomnia  #30 x 1   Entered by:   Lucious Groves CMA   Authorized by:   Nani Gasser MD   Signed by:   Lucious Groves CMA on 07/01/2010   Method used:   [facility]phoned to ...       [facility]       [address], [place]  [address]       Ph: [phone]       Fax: [phone]   RxID:   [id number]

## 2010-07-07 NOTE — Letter (Signed)
Summary: BP Log & Med List Brought by Patient  BP Log & Med List Brought by Patient   Imported By: Patricia Belcher 03/22/2010 11:53:47  _____________________________________________________________________  External Attachment:    Type:   Image     Comment:   External Document  

## 2010-07-07 NOTE — Progress Notes (Signed)
  Phone Note Refill Request  on June 29, 2010 11:55 AM  Refills Requested: Medication #1:  AMBIEN 10 MG TABS Take 1 tablet by mouth once a day at night as needed for insomnia Initial call taken by: Avon Gully CMA, Duncan Dull),  June 29, 2010 11:55 AM    Prescriptions: AMBIEN 10 MG TABS (ZOLPIDEM TARTRATE) Take 1 tablet by mouth once a day at night as needed for insomnia  #30 x 1   Entered by:   Avon Gully CMA, (AAMA)   Authorized by:   Nani Gasser MD   Signed by:   Avon Gully CMA, (AAMA) on 06/29/2010   Method used:   Printed then faxed to ...       [facility] [address]       [address] [place]  [address]       Ph: [phone]       Fax: [phone]   RxID:   [id number]

## 2010-07-08 ENCOUNTER — Ambulatory Visit (INDEPENDENT_AMBULATORY_CARE_PROVIDER_SITE_OTHER): Payer: Managed Care, Other (non HMO) | Admitting: Gastroenterology

## 2010-07-08 ENCOUNTER — Encounter: Payer: Self-pay | Admitting: Internal Medicine

## 2010-07-08 ENCOUNTER — Other Ambulatory Visit: Payer: Self-pay | Admitting: Internal Medicine

## 2010-07-08 ENCOUNTER — Encounter: Payer: Self-pay | Admitting: Gastroenterology

## 2010-07-08 ENCOUNTER — Other Ambulatory Visit: Payer: Self-pay | Admitting: Gastroenterology

## 2010-07-08 DIAGNOSIS — K2970 Gastritis, unspecified, without bleeding: Secondary | ICD-10-CM

## 2010-07-08 DIAGNOSIS — K219 Gastro-esophageal reflux disease without esophagitis: Secondary | ICD-10-CM | POA: Insufficient documentation

## 2010-07-08 DIAGNOSIS — K2990 Gastroduodenitis, unspecified, without bleeding: Secondary | ICD-10-CM

## 2010-07-08 DIAGNOSIS — R1011 Right upper quadrant pain: Secondary | ICD-10-CM

## 2010-07-08 DIAGNOSIS — Z1211 Encounter for screening for malignant neoplasm of colon: Secondary | ICD-10-CM

## 2010-07-09 LAB — COMPREHENSIVE METABOLIC PANEL
ALT: 25 U/L (ref 0–53)
AST: 20 U/L (ref 0–37)
CO2: 25 mEq/L (ref 19–32)
Creat: 0.91 mg/dL (ref 0.40–1.50)
Total Bilirubin: 0.3 mg/dL (ref 0.3–1.2)

## 2010-07-12 NOTE — Assessment & Plan Note (Addendum)
Summary: RIGHT UPPER QUAD PAIN/LAW   Vital Signs:  Patient profile:   60 year old male Height:      69 inches Weight:      201.50 pounds BMI:     29.86 Temp:     98.1 degrees F oral Pulse rate:   56 / minute BP sitting:   140 / 88  (left arm)  Vitals Entered By: Carolan Clines LPN (July 07, 2353 10:53 AM)  Visit Type:  consult Referring Provider:  Erenest Blank Primary Care Provider:  Erenest Blank  Chief Complaint:  ruq pain.  History of Present Illness: Thomas [name] is a pleasant 60 y/o WM, patient of Dr. Joneen Caraway, who presents for further evaluation of chronic RUQ pain.   EGD/TCS in [place], 15 yrs ago. Gastric erosions, hh, small pyloric opening per patient. Some intermittent flare-up of GERD but Nexium works good when takes it. Needs it more frequently lately. RUQ pressure sometimes relieved with BM. Started over 3 weeks ago again, more constant now. GB out about two years ago, cholelithiasis. RUQ really since gb disease.  Worse with food. C5,6,7 issues. No NSAIDS/ASA. BM regular. Metamucil as needed. No melena, brbpr. No weight loss. Reports negative stress test last couple of years.   MRI lumbar/cervical spine, 04/16/10:  Large stable left renal cyst, degenerative cervical spondylosis with disc disease and facet disease, multilevel disc protrusions (cervical), bilateral foraminal stenosis at C6-7.  Current Medications (verified): 1)  Ambien 10 Mg Tabs (Zolpidem Tartrate) .... Take 1 Tablet By Mouth Once A Day At Night As Needed For Insomnia 2)  Metoprolol Succinate 100 Mg Xr24h-Tab (Metoprolol Succinate) .... Take 1 Tablet By Mouth Once A Day 3)  Fexofenadine Hcl 180 Mg Tabs (Fexofenadine Hcl) .... Take 1 Tablet By Mouth Once A Day As Needed Allergies 4)  Nexium 40 Mg Cpdr (Esomeprazole Magnesium) .... Take 1 Tablet By Mouth Once A Day 5)  Tribenzor 40-5-25 Mg Tabs (Olmesartan-Amlodipine-Hctz) .... 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  Married to Thomas [name]  Has 3 children 33, 34, 36.  Never smoked.  2 EtOH drinks per week, no drugs, 1 caffeinated drink.  No  regular exercise.  Lives in [place]. Reports no regular alcohol use.   Review of Systems General:  Denies fever, chills, sweats, anorexia, fatigue, weakness, and weight loss. Eyes:  Denies vision loss. ENT:  Denies sore throat, hoarseness, and difficulty swallowing. CV:  Denies chest pains, angina, palpitations, dyspnea on exertion, and peripheral edema. Resp:  Denies dyspnea at rest, dyspnea with exercise, cough, sputum, and wheezing. GI:  See HPI. GU:  Denies urinary burning and blood in urine. MS:  Complains of low back pain; denies joint pain / LOM. Derm:  Denies rash and itching. Neuro:  Denies weakness, frequent headaches, memory loss, and confusion. Psych:  Denies depression and anxiety. Endo:  Denies unusual weight change. Heme:  Denies bruising and bleeding. Allergy:  Denies hives and rash.  Physical Exam  General:  Well developed, well nourished, no acute distress. Head:  Normocephalic and atraumatic. Eyes:  Conjunctivae pink, no  scleral icterus.  Mouth:  Oropharyngeal mucosa moist, pink.  No lesions, erythema or exudate.    Neck:  Supple; no masses or thyromegaly. Lungs:  Clear throughout to auscultation. Heart:  Regular rate and rhythm; no murmurs, rubs,  or bruits. Abdomen:  Soft. Positive BS. ND. Moderate tenderness in epigastrium and RUQ without rebound or guarding. No HSM or masses. No abd bruit  or hernia. Extremities:  No clubbing, cyanosis, edema or deformities noted. Neurologic:  Alert and  oriented x4;  grossly normal neurologically. Skin:  Intact without significant lesions or rashes. Cervical Nodes:  No significant cervical adenopathy. Psych:  Alert and cooperative. Normal mood and affect.   Impression & Recommendations:  Problem # 1:  RUQ PAIN (ICD-789.01) Acute on chronic RUQ abd pain. No other significant symptoms. S/P cholecystectomy. GERD controlled with as needed Nexium but requires for frequently. Patient reports h/o gastritis and ?pyloric stenosis on remote EGD. Given significant tenderness on exam, will do CT A/P with contrast. May ultimately require EGD.  Orders: T-Comprehensive Metabolic Panel ([id number]) Consultation Level IV ([address])  Problem # 2:  SCREENING COLORECTAL-CANCER (ICD-V76.51)  Due for screening TCS. Await CT findings prior to scheduling as he may also need EGD.  Orders: Consultation Level IV (91478)   Orders Added: 1)  T-Comprehensive Metabolic Panel [[id number]] 2)  Consultation Level IV [[address]562]   I would like to thank Dr. Linford Arnold for allowing [place] to take part in the care of this nice patient.

## 2010-07-12 NOTE — Letter (Signed)
Summary: RAD ORDER CT ABD/PEL W/IV/ORAL CM  RAD ORDER CT ABD/PEL W/IV/ORAL CM   Imported By: Rexene Alberts 07/08/2010 12:00:58  _____________________________________________________________________  External Attachment:    Type:   Image     Comment:   External Document

## 2010-07-13 ENCOUNTER — Ambulatory Visit (HOSPITAL_COMMUNITY): Admission: RE | Admit: 2010-07-13 | Payer: Managed Care, Other (non HMO) | Source: Ambulatory Visit

## 2010-07-13 LAB — CONVERTED CEMR LAB
CO2: 25 meq/L (ref 19–32)
Glucose, Bld: 104 mg/dL — ABNORMAL HIGH (ref 70–99)
Sodium: 140 meq/L (ref 135–145)
Total Bilirubin: 0.3 mg/dL (ref 0.3–1.2)
Total Protein: 6.8 g/dL (ref 6.0–8.3)

## 2010-07-14 ENCOUNTER — Other Ambulatory Visit: Payer: Self-pay | Admitting: Internal Medicine

## 2010-07-14 ENCOUNTER — Ambulatory Visit (HOSPITAL_COMMUNITY)
Admission: RE | Admit: 2010-07-14 | Discharge: 2010-07-14 | Disposition: A | Discharge status: Home / Self Care | Payer: Managed Care, Other (non HMO) | Source: Ambulatory Visit | Attending: Internal Medicine | Admitting: Internal Medicine

## 2010-07-14 DIAGNOSIS — N400 Enlarged prostate without lower urinary tract symptoms: Secondary | ICD-10-CM | POA: Insufficient documentation

## 2010-07-14 DIAGNOSIS — R933 Abnormal findings on diagnostic imaging of other parts of digestive tract: Secondary | ICD-10-CM | POA: Insufficient documentation

## 2010-07-14 DIAGNOSIS — K5730 Diverticulosis of large intestine without perforation or abscess without bleeding: Secondary | ICD-10-CM | POA: Insufficient documentation

## 2010-07-14 DIAGNOSIS — Q619 Cystic kidney disease, unspecified: Secondary | ICD-10-CM | POA: Insufficient documentation

## 2010-07-14 DIAGNOSIS — R1031 Right lower quadrant pain: Secondary | ICD-10-CM | POA: Insufficient documentation

## 2010-07-14 MED ORDER — IOHEXOL 300 MG/ML  SOLN
100.0000 mL | Freq: Once | INTRAMUSCULAR | Status: AC | PRN
  Administered 2010-07-14: 100 mL via INTRAVENOUS

## 2010-07-18 ENCOUNTER — Other Ambulatory Visit: Payer: Self-pay | Admitting: Internal Medicine

## 2010-07-18 DIAGNOSIS — K639 Disease of intestine, unspecified: Secondary | ICD-10-CM

## 2010-07-20 ENCOUNTER — Other Ambulatory Visit: Payer: Managed Care, Other (non HMO)

## 2010-07-20 ENCOUNTER — Ambulatory Visit (HOSPITAL_COMMUNITY): Admission: RE | Admit: 2010-07-20 | Payer: Managed Care, Other (non HMO) | Source: Ambulatory Visit

## 2010-07-20 ENCOUNTER — Ambulatory Visit (HOSPITAL_COMMUNITY): Payer: Managed Care, Other (non HMO) | Attending: Internal Medicine

## 2010-07-29 ENCOUNTER — Telehealth: Payer: Self-pay | Admitting: Family Medicine

## 2010-07-29 MED ORDER — METOPROLOL SUCCINATE ER 100 MG PO TB24: 100.0000 mg | ORAL_TABLET | Freq: Every day | ORAL | Status: DC

## 2010-07-29 MED ORDER — OLMESARTAN-AMLODIPINE-HCTZ 40-5-25 MG PO TABS: 40.0000 mg | ORAL_TABLET | Freq: Every day | ORAL | Status: DC

## 2010-07-29 NOTE — Telephone Encounter (Signed)
Call pt: He has not been d/c from the practice. Will refill his tribenzor and his metoprolol.

## 2010-07-29 NOTE — Telephone Encounter (Signed)
Pt's wife called adv pt has been having problems with blood pressure and has been out of his bp meds since last week. Mrs. Prime needs blood pressure refill called into [facility] in [place], [place] on [address]. Pt has been discharged from our office but does not have a medical doctor as of yet. Mrs. Kent request a call once his prescription has been filled in her cell [phone].

## 2010-08-04 ENCOUNTER — Other Ambulatory Visit (HOSPITAL_COMMUNITY): Payer: Self-pay | Admitting: Internal Medicine

## 2010-08-04 DIAGNOSIS — K639 Disease of intestine, unspecified: Secondary | ICD-10-CM

## 2010-08-08 ENCOUNTER — Other Ambulatory Visit: Payer: Self-pay

## 2010-08-09 ENCOUNTER — Other Ambulatory Visit: Payer: Managed Care, Other (non HMO)

## 2010-08-09 ENCOUNTER — Ambulatory Visit (HOSPITAL_COMMUNITY)
Admission: RE | Admit: 2010-08-09 | Discharge: 2010-08-09 | Disposition: A | Discharge status: Home / Self Care | Payer: Managed Care, Other (non HMO) | Source: Ambulatory Visit | Attending: Internal Medicine | Admitting: Internal Medicine

## 2010-08-09 DIAGNOSIS — K639 Disease of intestine, unspecified: Secondary | ICD-10-CM

## 2010-08-09 DIAGNOSIS — Z8719 Personal history of other diseases of the digestive system: Secondary | ICD-10-CM | POA: Insufficient documentation

## 2010-08-09 DIAGNOSIS — Q619 Cystic kidney disease, unspecified: Secondary | ICD-10-CM | POA: Insufficient documentation

## 2010-08-09 DIAGNOSIS — R1031 Right lower quadrant pain: Secondary | ICD-10-CM | POA: Insufficient documentation

## 2010-08-09 MED ORDER — GADOBENATE DIMEGLUMINE 529 MG/ML IV SOLN: 20.0000 mL | Freq: Once | INTRAVENOUS | Status: DC | PRN

## 2010-08-09 NOTE — Telephone Encounter (Signed)
Pt called and said he is still having some abdominal pain. He took quite a bit of Ibuprofen over the week-end when he was unable to get in touch with the office to inquire about some more Hydrocodone. He would like to get some if possible. He had his MRI done today.

## 2010-08-09 NOTE — Telephone Encounter (Signed)
LMOM for pt to call. 

## 2010-08-10 ENCOUNTER — Other Ambulatory Visit: Payer: Self-pay | Admitting: Family Medicine

## 2010-08-10 MED ORDER — HYDROCODONE-ACETAMINOPHEN 5-500 MG PO TABS: 1.0000 | ORAL_TABLET | ORAL | Status: DC | PRN

## 2010-08-10 NOTE — Telephone Encounter (Signed)
Please review and address 

## 2010-08-10 NOTE — Telephone Encounter (Signed)
Rx faxed to [facility]. Pt aware!

## 2010-08-10 NOTE — Telephone Encounter (Signed)
OK to refill. Please fax rx to pharmacy.

## 2010-08-12 ENCOUNTER — Telehealth: Payer: Self-pay | Admitting: Internal Medicine

## 2010-08-12 ENCOUNTER — Other Ambulatory Visit: Payer: Self-pay | Admitting: Internal Medicine

## 2010-08-12 DIAGNOSIS — Z1211 Encounter for screening for malignant neoplasm of colon: Secondary | ICD-10-CM

## 2010-08-12 DIAGNOSIS — R1011 Right upper quadrant pain: Secondary | ICD-10-CM

## 2010-08-12 NOTE — Progress Notes (Signed)
Gastroenterology Pre-Procedure Form  Request Date: 08/12/2010,       PATIENT INFORMATION:  Thomas [name] is a 60 y.o., male (DOB=1951/03/24).  PROCEDURE: Procedure(s) requested: endoscopy,colonoscopy Procedure Reason: RUQ ABD PAIN,screening for colon cancer  PATIENT REVIEW QUESTIONS: The patient reports the following:   1. Diabetes Melitis: no 2. Joint replacements in the past 12 months: no 3. Major health problems in the past 3 months: no 4. Has an artificial valve or MVP:no 5. Has been advised in past to take antibiotics in advance of a procedure like teeth cleaning: no}    MEDICATIONS & ALLERGIES:    Patient reports the following regarding taking any blood thinners:   Plavix? no Aspirin?no Coumadin?  no  Patient confirms/reports the following medications:  Current Outpatient Prescriptions  Medication Sig Dispense Refill  . HYDROcodone-acetaminophen (VICODIN) 5-500 MG per tablet Take 1 tablet by mouth every 4 (four) hours as needed for pain.  20 tablet  0  . metoprolol (TOPROL-XL) 100 MG 24 hr tablet Take 1 tablet (100 mg total) by mouth daily.  30 tablet  2  . Olmesartan-Amlodipine-HCTZ (TRIBENZOR) 40-5-25 MG TABS Take 40 mg by mouth daily.  30 tablet  2  . DISCONTD: losartan-hydrochlorothiazide (HYZAAR) 100-25 MG per tablet TAKE 1 TABLET BY MOUTH EVERY DAY IN THE MORNING  30 tablet  1    Patient confirms/reports the following allergies:  Allergies  Allergen Reactions  . Simvastatin     REACTION: arm numbness    Patient is appropriate to schedule for requested procedure(s): yes   No orders of the defined types were placed in this encounter.    SCHEDULE INFORMATION: Procedure has been scheduled as follows:  Date: 08/31/2010, Time: 9:00AM  Location: [place] SHORT STAY  This Gastroenterology Pre-Precedure Form is being routed to the following provider(s) for review: Unice Bailey

## 2010-08-12 NOTE — Telephone Encounter (Signed)
Gastroenterology Pre-Procedure Form  Request Date: 08/12/2010,      PATIENT INFORMATION:  Thomas [name] is a 60 y.o., male (DOB=August 08, 1950).  PROCEDURE: Procedure(s) requested: colonoscopy,egd Procedure Reason: screening for colon cancer,ruq abd pain  PATIENT REVIEW QUESTIONS: The patient reports the following:   1. Diabetes Melitis: no 2. Joint replacements in the past 12 months: no 3. Major health problems in the past 3 months: no 4. Has an artificial valve or MVP:no 5. Has been advised in past to take antibiotics in advance of a procedure like teeth cleaning: no}    MEDICATIONS & ALLERGIES:    Patient reports the following regarding taking any blood thinners:   Plavix? no Aspirin?no Coumadin?  no  Patient confirms/reports the following medications:  Current Outpatient Prescriptions  Medication Sig Dispense Refill  . esomeprazole (NEXIUM) 40 MG capsule Take 40 mg by mouth as needed.        Marland Kitchen HYDROcodone-acetaminophen (VICODIN) 5-500 MG per tablet Take 1 tablet by mouth every 4 (four) hours as needed for pain.  20 tablet  0  . metoprolol (TOPROL-XL) 100 MG 24 hr tablet Take 1 tablet (100 mg total) by mouth daily.  30 tablet  2  . Olmesartan-Amlodipine-HCTZ (TRIBENZOR) 40-5-25 MG TABS Take 40 mg by mouth daily.  30 tablet  2  . DISCONTD: losartan-hydrochlorothiazide (HYZAAR) 100-25 MG per tablet TAKE 1 TABLET BY MOUTH EVERY DAY IN THE MORNING  30 tablet  1    Patient confirms/reports the following allergies:  Allergies  Allergen Reactions  . Simvastatin     REACTION: arm numbness    Patient is appropriate to schedule for requested procedure(s): yes   Orders Placed This Encounter  Procedures  . EGD    Standing Status: Future     Number of Occurrences:      Standing Expiration Date: 08/12/2011    Order Specific Question:  Pre-op diagnosis    Answer:  RUQ ABD PAIN    Order Specific Question:  Pre-op visit required?    Answer:  No [0]    Order Specific Question:   Special needs    Answer:  NO  . Endoscopy, colon, diagnostic    Standing Status: Future     Number of Occurrences:      Standing Expiration Date: 08/12/2011    Order Specific Question:  Pre-op diagnosis    Answer:  SCREENING    Order Specific Question:  Pre-op visit required?    Answer:  No [0]    Order Specific Question:  Special needs    Answer:  NO    SCHEDULE INFORMATION: Procedure has been scheduled as follows:  Date: 09/18/2010, Time: 10:00am  Location: [place] SHORT STAY  This Gastroenterology Pre-Precedure Form is being routed to the following provider(s) for review: Tana Coast, PA

## 2010-08-12 NOTE — Telephone Encounter (Signed)
Okay to schedule. No change in medications.

## 2010-08-15 NOTE — Telephone Encounter (Signed)
Instructions placed in the mail 

## 2010-08-18 ENCOUNTER — Other Ambulatory Visit: Payer: Self-pay | Admitting: Family Medicine

## 2010-08-18 NOTE — Telephone Encounter (Signed)
Pt has follow up with Dr. Jena Gauss on 08/31/10.

## 2010-08-24 ENCOUNTER — Other Ambulatory Visit: Payer: Self-pay

## 2010-08-26 MED ORDER — HYDROCODONE-ACETAMINOPHEN 5-500 MG PO TABS: 1.0000 | ORAL_TABLET | ORAL | Status: DC | PRN

## 2010-08-29 ENCOUNTER — Other Ambulatory Visit: Payer: Self-pay

## 2010-08-29 NOTE — Telephone Encounter (Signed)
Rx faxed to [facility] [place], [address]. [phone]

## 2010-08-30 DIAGNOSIS — D126 Benign neoplasm of colon, unspecified: Secondary | ICD-10-CM

## 2010-08-30 HISTORY — DX: Benign neoplasm of colon, unspecified: D12.6

## 2010-08-30 HISTORY — PX: UPPER GASTROINTESTINAL ENDOSCOPY: SHX188

## 2010-08-30 HISTORY — PX: COLONOSCOPY: SHX174

## 2010-08-31 ENCOUNTER — Encounter: Payer: Managed Care, Other (non HMO) | Admitting: Internal Medicine

## 2010-08-31 ENCOUNTER — Other Ambulatory Visit: Payer: Self-pay | Admitting: Internal Medicine

## 2010-08-31 ENCOUNTER — Ambulatory Visit (HOSPITAL_COMMUNITY)
Admission: RE | Admit: 2010-08-31 | Discharge: 2010-08-31 | Disposition: A | Discharge status: Home / Self Care | Payer: Managed Care, Other (non HMO) | Source: Ambulatory Visit | Attending: Internal Medicine | Admitting: Internal Medicine

## 2010-08-31 DIAGNOSIS — R933 Abnormal findings on diagnostic imaging of other parts of digestive tract: Secondary | ICD-10-CM | POA: Insufficient documentation

## 2010-08-31 DIAGNOSIS — K209 Esophagitis, unspecified without bleeding: Secondary | ICD-10-CM | POA: Insufficient documentation

## 2010-08-31 DIAGNOSIS — K5730 Diverticulosis of large intestine without perforation or abscess without bleeding: Secondary | ICD-10-CM

## 2010-08-31 DIAGNOSIS — I10 Essential (primary) hypertension: Secondary | ICD-10-CM | POA: Insufficient documentation

## 2010-08-31 DIAGNOSIS — D126 Benign neoplasm of colon, unspecified: Secondary | ICD-10-CM | POA: Insufficient documentation

## 2010-08-31 DIAGNOSIS — R109 Unspecified abdominal pain: Secondary | ICD-10-CM

## 2010-08-31 DIAGNOSIS — Z79899 Other long term (current) drug therapy: Secondary | ICD-10-CM | POA: Insufficient documentation

## 2010-08-31 MED ORDER — HYDROCODONE-ACETAMINOPHEN 5-500 MG PO TABS: 1.0000 | ORAL_TABLET | ORAL | Status: DC | PRN

## 2010-08-31 NOTE — Progress Notes (Signed)
A user error has taken place: encounter opened in error, closed for administrative reasons.

## 2010-08-31 NOTE — Progress Notes (Signed)
Addended by: Cloria Spring on: 08/31/2010 11:44 AM   Modules accepted: Orders

## 2010-09-01 NOTE — Op Note (Signed)
NAME:  Thomas [name], Thomas [name]                ACCOUNT NO.:  [id number]  MEDICAL RECORD NO.:  [id number]           PATIENT TYPE:  O  LOCATION:  [facility]                          FACILITY:  [facility]  PHYSICIAN:  R. Roetta Sessions, M.D. DATE OF BIRTH:  01-25-1951  DATE OF PROCEDURE:  08/31/2010 DATE OF DISCHARGE:                              OPERATIVE REPORT   EGD with esophageal biopsy followed by colonoscopy and snare polypectomy.  INDICATIONS FOR PROCEDURE:  A 60 year old gentleman with recent abdominal pain and enteritis on CT, not borne out on followup MRE.  The patient presents now for further evaluation of right upper quadrant abdominal pain which is improved considerably over the past couple of weeks.  Also, he is overdue for screening colonoscopy.  Colonoscopy is now being done as well.  Risks, benefits, limitations, alternatives, and imponderables have been reviewed, questions answered.  Please see the documentation medical in the record.  PROCEDURE NOTE:  O2 saturation, blood pressure, pulse, and respirations were monitored throughout the entire procedure.  CONSCIOUS SEDATION:  Versed 6 mg IV, Demerol 125 mg IV in divided doses.  INSTRUMENT:  Pentax video chip system.  FINDINGS:  Examination of the tubular esophagus revealed a 1 cm "island" of salmon-colored epithelium approximately a centimeter above the GE junction, otherwise esophageal mucosa appeared normal.  EG junction easily traversed.  Stomach:  Gastric cavity was emptied and insufflated well with air. Thorough examination of the gastric mucosa including retroflexed proximal stomach esophagogastric junction demonstrated no abnormalities. Pylorus was patent and easily traversed.  Examination of the bulb, second, and third portion revealed no abnormalities.  THERAPEUTIC/DIAGNOSTIC MANEUVERS PERFORMED:  The island of salmon- colored epithelium was biopsied for histologic study.  The patient tolerated the procedure well, was  prepared for colonoscopy.  Digital rectal exam revealed no abnormalities.  Endoscopic findings:  Prep was good.  Colon:  Colonic mucosa was surveyed from the rectosigmoid junction through the left transverse right colon to the appendiceal orifice, ileocecal valve/cecum.  These structures were well seen and photographed for record.  I briefly attempted to intubate terminal ileum, was unable to do so.  From this level, the scope was slowly and cautiously withdrawn.  All previously mentioned mucosal surfaces were again seen.  The patient had pancolonic diverticula and there is a 5-mm cecal polyp at the base of the cecum which was cold snared and recovered.  Remainder of colonic mucosa appeared normal.  The scope was pulled down to the rectum where thorough examination of rectal mucosa including retroflexion at anal verge demonstrated no abnormalities.  The patient tolerated the procedure well.  Cecal withdrawal time 60 minutes.  IMPRESSION:  Esophagogastroduodenoscopy:  Island of salmon-colored epithelium, query patch Barrett esophagus status post biopsy, otherwise normal esophagus, stomach, D1, D2, and D3.  Colonoscopy findings:  Normal rectum, pancolonic diverticula.  Cecal polyp status post snare polypectomy.  Remainder of colonic mucosa appeared normal.  RECOMMENDATIONS: 1. Follow up on path. 2. Further recommendations to follow.     Jonathon Bellows, M.D.     RMR/MEDQ  D:  08/31/2010  T:  09/01/2010  Job:  [id number]  Electronically  Signed by Lorrin Goodell M.D. on 09/01/2010 03:48:58 PM

## 2010-09-02 ENCOUNTER — Encounter: Payer: Self-pay | Admitting: Internal Medicine

## 2010-09-13 NOTE — Procedures (Signed)
[facility]                              EXERCISE TREADMILL   NAME:JONESEissa, Buchberger                       MRN:          [id number]  DATE:07/13/2008                            DOB:          [birth year]    REFERRING PHYSICIAN:  Nani Gasser, M.D.   CLINICAL DATA:  A 60 year old gentleman with multiple cardiovascular  risk factors and chest discomfort.  1. Treadmill exercise performed to a workload of 12 METS and a heart      rate of 134, 83% of age-predicted maximum.  Exercise discontinued      due to dyspnea and fatigue; no chest discomfort reported.  2. Blood pressure increased from a resting value of 150/80 to 200/90,      a minimally hypertensive response from a mildly hypertensive      baseline.  No significant arrhythmias noted.  There were      monomorphic premature ventricular depolarizations that were      infrequent throughout most of the test.  3. Resting EKG:  Sinus bradycardia; borderline first-degree AV block;      delayed R-wave progression - cannot exclude previous anteroseptal      myocardial infarction; very minor nonspecific T-wave abnormality.  4. Stress EKG:  No significant change.   IMPRESSION:  Negative and adequate-graded exercise test.     Gerrit Friends. Dietrich Pates, MD, [facility]  Electronically Signed    RMR/MedQ  DD: 07/13/2008  DT: 07/14/2008  Job #: [id number]

## 2010-09-13 NOTE — Letter (Signed)
July 07, 2008    Nani Gasser, M.D.  [phone]  [place], [place] [address]   RE:  ISSACC, MERLO  MRN:  [id number]  /  DOB:  08/04/1950   Dear Santina Evans:   It was my pleasure evaluating Mr. Carranza in the office today in  consultation at your request for chest discomfort.  As you know, this  nice gentleman has enjoyed generally good health.  He has a history of  hypertension that has been well treated medically as well as  hyperlipidemia.  He does not have any known cardiovascular disease.  She  did undergo stress testing approximately 7 years ago as part of a  physical assessment.  The results were apparently benign.  Over the past  month or so, he has noted episodes of chest discomfort that radiates to  the back and shoulder as well as the left neck.  These are moderate  intensity.  There is no definite relationship to exertion.  There is no  associated dyspnea, diaphoresis, or nausea.  Symptoms typically last for  a fairly short period of time.  He reports significant psychologic  stress, which she believes may be contributing.  He has had some  gastrointestinal distress as well relieved with antacids.   Past surgical history includes cholecystectomy and appendectomy.   He has no known allergies.   Current medications include a multivitamin, fish oil, enalapril HCT  10/25 mg daily, metoprolol 100 mg daily, rosuvastatin 20 mg daily,  losartan 50 mg daily.  He uses Ambien and fexofenadine p.r.n.   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   Family history is positive for fatal neoplastic disease in both his  mother and father.  He has 4 siblings who are alive and well.   Review of systems is notable for a remote history of peptic ulcer  disease.  All other systems reviewed and are negative.   PHYSICAL EXAMINATION:  GENERAL:  On exam, pleasant,  proportionate  gentleman, in no acute distress.  VITAL SIGNS:  The weight is 205.  Blood pressure 140/95, heart rate 60  and regular, respirations 14 and unlabored.  HEENT:  Anicteric sclerae; normal lids and conjunctivae.  NECK:  No jugular venous distention; normal carotid upstrokes without  bruits.  ENDOCRINE:  No thyromegaly.  HEMATOPOIETIC:  No adenopathy.  SKIN:  No significant lesions.  LUNGS:  Clear.  CARDIAC:  Normal first and second heart sounds; minor systolic ejection  murmur.  ABDOMEN:  Soft and nontender; no organomegaly.  EXTREMITIES:  No edema; distal pulses intact.  NEUROLOGIC:  Symmetric strength and tone; normal cranial nerves.   Laboratories from your office includes normal ABIs, a normal renal  ultrasound except for the presence of a cyst on the left.  Normal CBC,  normal chemistry profile, and normal TSH.  Chest x-ray was normal except  for mild cardiomegaly.   EKG:  Sinus bradycardia at a rate of 59; cannot exclude prior septal  myocardial infarction; leftward axis.  No prior tracing for comparison.   IMPRESSION:  Mr. Quesnell has somewhat atypical symptoms, but significant  cardiovascular risk factors.  We will proceed with a echocardiogram to  evaluate his cardiomegaly and for possible hypertensive heart disease as  well as treadmill exercise test.  I will see him the day that he comes  in for those studies.  If results are good, I  do not anticipate the need  for additional  cardiology consultation.  It appears that he may require more in the way  of antihypertensive medication.   Thank you so much for allowing me to assist in the care of this nice  gentleman.  I will let you know the results of this testing as soon as  it has been completed.    Sincerely,      Gerrit Friends. Dietrich Pates, MD, [facility]  Electronically Signed    RMR/MedQ  DD: 07/08/2008  DT: 07/09/2008  Job #: [id number]

## 2010-09-13 NOTE — Letter (Signed)
July 13, 2008    Thomas Gasser, MD  [phone]  [place], [place] [address]   RE:  Thomas [name], Thomas [name]  MRN:  [id number]  /  DOB:  10/23/1950   Dear Dr. Linford Arnold:   Thomas [name] returns to the office for further evaluation of his chest  discomfort.  He has been doing well in recent weeks.  He has been taking  Nexium, which he believes has been of benefit.  He also notes some mild  twinges of chest discomfort with movement of the trunk.  He has had no  exertional symptoms.   A treadmill stress test was performed.  He achieved a reasonable  workload of 9 METs and an adequate heart rate.  He stopped with dyspnea  and fatigue.  There was no chest discomfort.  His EKG was negative for  ischemia.   Medications are unchanged from his last visit.  He has noted some  elevated blood pressures when checking in the local pharmacy or at home  with systolics as high as 170 and diastolics as high as 110.   PHYSICAL EXAMINATION:  GENERAL:  Pleasant gentleman in no acute  distress.  VITAL SIGNS:  The blood pressure is 145/80, heart rate 60 and regular,  respirations 14.  NECK:  No jugular venous distention present.  CARDIAC:  Normal first and second heart sounds; minimal systolic  ejection murmur.  LUNGS:  Clear.  ABDOMEN:  Soft and nontender.  EXTREMITIES:  No edema.   LABORATORY STUDIES:  Normal CBC and chemistry profile.  TSH was normal.   Echocardiogram showed normal left ventricular size and function.  There  was no LVH nor valvular abnormalities.   IMPRESSION:  Thomas [name] is doing well symptomatically.  He does not have  evidence for ischemic or structural heart disease.  I think that  continued optimization of cardiovascular risk factors is the appropriate  treatment approach.   Towards that end, he needs additional blood pressure lowering.  Enalapril hydrochlorothiazide will be changed to lisinopril  hydrochlorothiazide 40/25 mg daily.  His dose of Cozaar will be  increased to 100 mg daily.  He will maintain a list of blood pressures  for your review and followup with you in the near future.  Please let me  know at any time that I can assist in the care of this nice gentleman.    Sincerely,      Thomas Friends. Dietrich Pates, MD, [facility]  Electronically Signed    RMR/MedQ  DD: 07/13/2008  DT: 07/14/2008  Job #: [id number]

## 2010-09-16 NOTE — Op Note (Signed)
NAME:  Thomas [name], Thomas [name] NO.:  [id number]   MEDICAL RECORD NO.:  [id number]          PATIENT TYPE:  OIB   LOCATION:  0098                         FACILITY:  [facility]   PHYSICIAN:  Anselm Pancoast. Weatherly, M.D.DATE OF BIRTH:  March 28, 1951   DATE OF PROCEDURE:  08/11/2006  DATE OF DISCHARGE:                               OPERATIVE REPORT   PREOPERATIVE DIAGNOSIS:  Acute cholecystitis with possible common duct  stone.   POSTOPERATIVE DIAGNOSIS:  Acute gangrenous cholecystitis with stone  packed within the proximal cystic duct.   OPERATION:  Laparoscopic cholecystectomy with cholangiogram.   SURGEON:  Dr. Consuello Bossier   ASSISTANT:  Dr. Cyndia Bent   HISTORY:  Thomas [name] is a 60 year old male, who has had  episodes of epigastric pain intermittently for some time.  He has had  extensive workups including cardiac endoscopy, present episode of pain  started earlier in the week, and a repeat ultrasound that showed a  little fluid around the gallbladder.  The walls were not thickened.  I  could see sludge within the gallbladder, but the common bile duct, etc.,  were not dilated.  He was referred over from [facility] in [place] to  Dr. Earlene Plater, who saw him in the urgent office on Thursday.  At that time,  the patient was described as not having any acute symptoms but did have  a mildly elevated white count, and Dr. Earlene Plater added him to the OR  schedule for me to do today.  Yesterday he came by and had repeat blood  work.  The liver tests were still mildly elevated, but his bilirubin had  been 0.8 previously in the week and was now 2.7, and the patient  presented this morning, really came in about 4 a.m. banging on the  operating room door because he was having just severe chest discomfort.  The anesthesiologist was notified.  They put him on in the recovery  room, started an IV, and gave him Dilaudid.  I first met the patient  this morning and on examination,  he was complaining not of pain in the  right upper quadrant but really substernal chest discomfort, etc.  An  EKG kind of showed possible anterior MI, age undetermined but Dr.  Rica Mast, who was the anesthesiologist, of the opinion that this is  basically because of kind of the precordial leads and was most likely  not cardiac in origin.  We got repeat cardiac enzymes, and his bilirubin  now was 2.2 where it had been 2.7 yesterday, and his liver function  studies were basically similar, slightly abnormal but not really  significantly abnormal.  With the cardiac enzymes basically being  cleared, I talked with Dr. Wendall Papa, who is available in case he does  have a common duct stone to do an ERCP later today, but we are going to  proceed on with cholecystectomy first.  The patient was given 3 g of  Unasyn.  He has PAS stockings and after induction of general anesthesia,  I did do an in and out catheterization because he was having difficulty  voiding, but that was probably related to the narcotics that he had  received when he first arrived here early this morning.  Induction of  general anesthesia.  He is a heavy, kind of a large abdomen individual,  and the upper abdomen was prepped with a Betadine solution and then  draped in a sterile manner.  A small incision was made below the  umbilicus.  He has kind of a thinned out abdominal wall, and then we  carefully opened into the peritoneal cavity.  A pursestring suture of 0  Vicryl was placed, and the Hasson cannula introduced.  There is a  significant inflammatory process in the right upper quadrant.  You could  not see the gallbladder itself.  The upper 10 mm trocar was placed in  the subxiphoid area, anesthetized the fascia and then at first Dr.  Abbey Chatters scrubbed in, but Dr. Jamey Ripa relieved him very shortly after  we started the procedure.  First, we kind of tried to peel down this  very inflamed omentum that was just permanently  adherent to the  gallbladder.  We then switched to a 30-degree camera and also put a  squiggly through a 5 mm port to the left of midline so we could push  down on this omentum to kind of give [place] some visualization of the  proximal portion of the gallbladder.  Next, we aspirated the gallbladder  with the __________ aspirator which then allowed Dr. Jamey Ripa to grasp it  with the million dollar retractors.  The proximal portion of the  gallbladder was kind of teased of the surrounding inflamed tissue from  it.  Could see where we think the junction of the gallbladder and the  cystic duct and everything is.  This was markedly inflamed, but we were  able to visualize the cystic artery which was doubly clipped proximally  and then also the cystic duct, and the cystic duct was not dilated more  proximally but had a stone impacted in the mid portion of the cystic  duct.  I placed a clip distal to the first stone, kind of opening the  cystic duct so we could milk this stone out of the cystic duct and then  put a cholangiocatheter in and held it in place with a clip.  X-ray was  obtained which showed good filling of the extrahepatic biliary system,  good flow into the duodenum, and I think the elevated liver tests are  really kind of a Moretz's syndrome type where he has markedly inflamed  gallbladder kind of putting instrinsic pressure on the bile ducts, etc.  With the catheter completed, we removed it, and I put a triple clip on  the more proximal cystic duct down in the area where hopefully it is  still viable in the area where it was distal to the stone.  It was just  basically nearly necrotic.  We then kindly teased out the gallbladder,  carefully dissecting first with the hook and then switching to the  spatula.  I had placed a clip distal to the 2 previously clipped on the  cystic artery and divided it and then kind of carefully dissected out this markedly inflamed gallbladder from the hepatic  fossa.  Hemostasis  was reasonable good.  There was no actual bleeding, but we coagulated  many and as far as trying to get in the plane where the gallbladder  peritoneum type plane was just basically obliterated because of the  marked inflammation.  Most  distal portion of the gallbladder was nearly  intrahepatic, and it was likewise difficult dissecting it free, and we  did cauterize the liver bed fairly generously in the distal portion of  the gallbladder for hemostasis.  I put a 19 Blake drain under the  undersurface of the liver and then brought it out through the lateral 5  mm port, withdrew the squiggly that we had used for exposure and then  switched the camera to the upper 10 mm port and withdrew the gallbladder  within the EndoCatch bag.  The gallbladder just basically very, very  thickened, and I had to enlarge the incision just a little bit just to  get the markedly edematous gallbladder out.  The bag stayed intact, and  then we placed the Hasson cannula back into the umbilicus.  Next, we  thoroughly irrigated.  Looked good.  We coagulated a few little areas of  question and placed a little piece of Surgicel in the gallbladder fossa  in the drain.  Harrison Mons drain is kind of laying at the most dependent  proximal portion of the gallbladder fossa.  The drain was sutured to the  skin.  We switched the camera back up to 10 mm port, withdrew the 5 mm  port that was remaining under direct vision, and then closed the fascia  at the umbilicus with 2 figure-of-eight sutures of 0 Vicryl and then  placed 1 figure-of-eight suture in the fascia in the subxiphoid area.  The drain has a good seal.  There is not extensive drainage, and I am  going to keep him on antibiotics for at least 24 hours, and he possibly  will go home tomorrow.  I think this will relieve his acute problems and  think it is unlikely that there is any stone in the common bile duct.  I  am going to leave the drain in at least  4 days because of the marked  inflammation in case there would be bile leak from the cystic duct stump  because of the questionable viable, because of the marked inflammatory  response that was occurring.  The patient's primary physician is  Nani Gasser, M.D.  She is with [facility] in [place].           ______________________________  Anselm Pancoast. Zachery Dakins, M.D.     WJW/MEDQ  D:  08/11/2006  T:  08/11/2006  Job:  [id number]   cc:   Rachael Fee, MD  [address]  [place], [place] [address]

## 2010-09-21 ENCOUNTER — Other Ambulatory Visit: Payer: Self-pay

## 2010-09-21 ENCOUNTER — Other Ambulatory Visit: Payer: Self-pay | Admitting: Family Medicine

## 2010-09-21 NOTE — Telephone Encounter (Signed)
Pt called and requested refill for zolpidem 10 mg PO QHS.  His last office visit is for 05-01-10 for his ADD.  At some point it appeared there was a Discharge, but then I saw where you said no the patient was not discharged.  Please advise if a refill can be sent of the Zolpidem.  Thanks. Plan:  Routed to Dr. Marlyne Beards, LPN [facility]

## 2010-09-21 NOTE — Telephone Encounter (Signed)
OK to refill with 3 refills. He was never discharged. It got entered on the wrong pt.. That is why it looks so confusing.

## 2010-09-22 ENCOUNTER — Telehealth: Payer: Self-pay | Admitting: Family Medicine

## 2010-09-22 DIAGNOSIS — G479 Sleep disorder, unspecified: Secondary | ICD-10-CM

## 2010-09-22 MED ORDER — ZOLPIDEM TARTRATE 10 MG PO TABS: 10.0000 mg | ORAL_TABLET | Freq: Every evening | ORAL | Status: DC | PRN

## 2010-09-22 NOTE — Telephone Encounter (Signed)
Please schedule appt

## 2010-09-22 NOTE — Telephone Encounter (Signed)
Faxed zolpidem to pt pharmacy.  Okayed by Dr. Linford Arnold.  10 mg PO QHS PRN #30/2 refills was sent to [facility]. Jarvis Newcomer, LPN [facility]

## 2010-09-23 ENCOUNTER — Other Ambulatory Visit: Payer: Self-pay | Admitting: *Deleted

## 2010-09-27 ENCOUNTER — Other Ambulatory Visit: Payer: Self-pay

## 2010-09-27 NOTE — Telephone Encounter (Signed)
LMOM for pt that he needs OV prior to any more refills. Informed Alvino Chapel [facility] [place].

## 2010-09-27 NOTE — Telephone Encounter (Signed)
Needs OV prior to further Refills.

## 2010-09-28 ENCOUNTER — Ambulatory Visit: Payer: Managed Care, Other (non HMO) | Admitting: Urgent Care

## 2010-09-28 NOTE — Telephone Encounter (Signed)
Pt cancelled his OV for 09/28/10 @ 0930 with KJ. He said he would tough it out and wait.

## 2010-09-29 NOTE — Telephone Encounter (Signed)
Closed. Seleen Walter, LPN /Triage  

## 2010-10-07 ENCOUNTER — Other Ambulatory Visit: Payer: Self-pay | Admitting: Family Medicine

## 2010-10-31 ENCOUNTER — Other Ambulatory Visit: Payer: Self-pay | Admitting: Family Medicine

## 2010-12-07 ENCOUNTER — Other Ambulatory Visit: Payer: Self-pay | Admitting: Family Medicine

## 2010-12-15 ENCOUNTER — Other Ambulatory Visit: Payer: Self-pay | Admitting: Family Medicine

## 2010-12-21 ENCOUNTER — Other Ambulatory Visit: Payer: Self-pay | Admitting: *Deleted

## 2010-12-21 DIAGNOSIS — G479 Sleep disorder, unspecified: Secondary | ICD-10-CM

## 2010-12-21 MED ORDER — ZOLPIDEM TARTRATE 10 MG PO TABS: 10.0000 mg | ORAL_TABLET | Freq: Every evening | ORAL | Status: DC | PRN

## 2011-01-02 ENCOUNTER — Other Ambulatory Visit: Payer: Self-pay | Admitting: Family Medicine

## 2011-01-13 ENCOUNTER — Other Ambulatory Visit: Payer: Self-pay | Admitting: *Deleted

## 2011-02-27 ENCOUNTER — Other Ambulatory Visit: Payer: Self-pay | Admitting: Family Medicine

## 2011-03-20 ENCOUNTER — Telehealth: Payer: Self-pay | Admitting: Internal Medicine

## 2011-03-20 NOTE — Telephone Encounter (Signed)
Pt came to window today and asked about getting a prescription for Nexium. He was last seen in May 2012 and will have OV next week regarding bloating. [facility] [address] [place]

## 2011-03-21 MED ORDER — ESOMEPRAZOLE MAGNESIUM 40 MG PO CPDR: 40.0000 mg | DELAYED_RELEASE_CAPSULE | Freq: Every day | ORAL | Status: DC

## 2011-03-21 NOTE — Telephone Encounter (Signed)
Routed to refill box 

## 2011-03-30 ENCOUNTER — Ambulatory Visit (INDEPENDENT_AMBULATORY_CARE_PROVIDER_SITE_OTHER): Payer: Managed Care, Other (non HMO) | Admitting: Urgent Care

## 2011-03-30 ENCOUNTER — Encounter: Payer: Self-pay | Admitting: Urgent Care

## 2011-03-30 DIAGNOSIS — R1011 Right upper quadrant pain: Secondary | ICD-10-CM

## 2011-03-30 DIAGNOSIS — D126 Benign neoplasm of colon, unspecified: Secondary | ICD-10-CM

## 2011-03-30 DIAGNOSIS — M549 Dorsalgia, unspecified: Secondary | ICD-10-CM

## 2011-03-30 DIAGNOSIS — K219 Gastro-esophageal reflux disease without esophagitis: Secondary | ICD-10-CM

## 2011-03-30 NOTE — Progress Notes (Signed)
Cc to PCP 

## 2011-03-30 NOTE — Progress Notes (Signed)
Referring Provider: Nani Gasser, MD Primary Care Physician:  Nani Gasser, MD, MD Primary Gastroenterologist:  Dr. Jena Gauss  Chief Complaint  Patient presents with  . Abdominal Pain  . Gastrophageal Reflux    HPI:  Thomas [name] is a 60 y.o. male here for follow up for chronic GERD. He has daily heartburn and indigestion. Denies any dysphasia or odynophagia.  He has some RUQ discomfort that is intermittent, usually lasts hrs.  He ran out of nexium 1 mo and restarted today.  He tells me he always feels much better on nexium.  His appetite is fine. His right upper quadrant pain worse w/ large meals.  Movt makes worse.  BM ok.  BM couple per day.  Was taking hydrocodone for back pain.  He believes much of his right-sided pain is her referred from his chronic back pain. He has been referred to physical therapy however he never followed through with this.  Past Medical History  Diagnosis Date  . Hiatal hernia   . Hyperlipidemia   . Renal cyst     Large Left  . HTN (hypertension)   . Sleep apnea   . Chronic back pain   . Adenomatous colon polyp 08/2010    Past Surgical History  Procedure Date  . Appendectomy 1967  . Knee surgery 1995    Micro  . Cardiovascular stress test 2004    Treadmill Stress Test  . Cholecystectomy 07/2006  . Cardiovascular stress test 2010  . Colonoscopy 08/2010    Cecal tubular adenoma removed, otherwise normal exam  . Upper gastrointestinal endoscopy 08/2010    GERD benign biopsy    Current Outpatient Prescriptions  Medication Sig Dispense Refill  . metoprolol (TOPROL-XL) 100 MG 24 hr tablet TAKE 1 TABLET EVERY DAY  30 tablet  3  . TRIBENZOR 40-5-25 MG TABS TAKE 1 TABLET BY MOUTH ONCE A DAY  30 tablet  1  . zolpidem (AMBIEN) 10 MG tablet Take 1 tablet (10 mg total) by mouth at bedtime as needed.  30 tablet  2  . esomeprazole (NEXIUM) 40 MG capsule Take 1 capsule (40 mg total) by mouth daily before breakfast.  30 capsule  11  . metoprolol  (TOPROL-XL) 100 MG 24 hr tablet TAKE 1 TABLET BY MOUTH DAILY  30 tablet  1    Allergies as of 03/30/2011 - Review Complete 03/30/2011  Allergen Reaction Noted  . Simvastatin  12/03/2009    Review of Systems: Gen: Denies any fever, chills, sweats, anorexia, fatigue, weakness, malaise, weight loss, and sleep disorder CV: Denies chest pain, angina, palpitations, syncope, orthopnea, PND, peripheral edema, and claudication. Resp: Denies dyspnea at rest, dyspnea with exercise, cough, sputum, wheezing, coughing up blood, and pleurisy. GI: Denies vomiting blood, jaundice, and fecal incontinence.   Denies dysphagia or odynophagia. Derm: Denies rash, itching, dry skin, hives, moles, warts, or unhealing ulcers.  Psych: Denies depression, anxiety, memory loss, suicidal ideation, hallucinations, paranoia, and confusion. Heme: Denies bruising, bleeding, and enlarged lymph nodes.  Physical Exam: BP 144/88  Pulse 60  Temp 97.4 F (36.3 C)  Ht 5\' 9"  (1.753 m)  Wt 204 lb 9.6 oz (92.806 kg)  BMI 30.21 kg/m2 General:   Alert,  Well-developed, well-nourished, pleasant and cooperative in NAD Head:  Normocephalic and atraumatic. Eyes:  Sclera clear, no icterus.   Conjunctiva pink. Mouth:  No deformity or lesions, oropharynx pink and moist. Neck:  Supple; no masses or thyromegaly. Heart:  Regular rate and rhythm; no murmurs, clicks, rubs,  or  gallops. Abdomen:  Soft, nontender and nondistended. No masses, hepatosplenomegaly or hernias noted. Negative Carnett sign.  Normal bowel sounds, without guarding, and without rebound.   Msk:  Symmetrical without gross deformities. Normal posture. Pulses:  Normal pulses noted. Extremities:  Without clubbing or edema. Neurologic:  Alert and  oriented x4;  grossly normal neurologically. Skin:  Intact without significant lesions or rashes. Cervical Nodes:  No significant cervical adenopathy. Psych:  Alert and cooperative. Normal mood and affect.

## 2011-03-30 NOTE — Assessment & Plan Note (Signed)
Follow up with her primary care provider for this

## 2011-03-30 NOTE — Assessment & Plan Note (Addendum)
Resume Nexium 40 mg daily Resume Nexium 40 mg daily Call if your abdominal pain is no better in 10 days Follow up with your primary care doctor regarding your chronic back pain management

## 2011-03-30 NOTE — Patient Instructions (Signed)
Resume Nexium 40 mg daily Call if your abdominal pain is no better in 10 days Follow up with your primary care doctor regarding your chronic back pain management  Diet for GERD or PUD Nutrition therapy can help ease the discomfort of gastroesophageal reflux disease (GERD) and peptic ulcer disease (PUD).  HOME CARE INSTRUCTIONS   Eat your meals slowly, in a relaxed setting.   Eat 5 to 6 small meals per day.   If a food causes distress, stop eating it for a period of time.  FOODS TO AVOID  Coffee, regular or decaffeinated.   Cola beverages, regular or low calorie.   Tea, regular or decaffeinated.   Pepper.   Cocoa.   High fat foods, including meats.   Butter, margarine, hydrogenated oil (trans fats).   Peppermint or spearmint (if you have GERD).   Fruits and vegetables if not tolerated.   Alcohol.   Nicotine (smoking or chewing). This is one of the most potent stimulants to acid production in the gastrointestinal tract.   Any food that seems to aggravate your condition.  If you have questions regarding your diet, ask your caregiver or a registered dietitian. TIPS  Lying flat may make symptoms worse. Keep the head of your bed raised 6 to 9 inches (15 to 23 cm) by using a foam wedge or blocks under the legs of the bed.   Do not lay down until 3 hours after eating a meal.   Daily physical activity may help reduce symptoms.  MAKE SURE YOU:   Understand these instructions.   Will watch your condition.   Will get help right away if you are not doing well or get worse.  Document Released: 04/17/2005 Document Revised: 12/28/2010 Document Reviewed: 08/31/2008 [facility] Patient Information 2012 [place], [place].

## 2011-04-18 ENCOUNTER — Encounter: Payer: Self-pay | Admitting: Cardiology

## 2011-04-24 ENCOUNTER — Other Ambulatory Visit: Payer: Self-pay | Admitting: Family Medicine

## 2011-06-11 ENCOUNTER — Other Ambulatory Visit: Payer: Self-pay | Admitting: Family Medicine

## 2011-06-20 ENCOUNTER — Ambulatory Visit (INDEPENDENT_AMBULATORY_CARE_PROVIDER_SITE_OTHER): Payer: Managed Care, Other (non HMO) | Admitting: Family Medicine

## 2011-06-20 VITALS — BP 132/76 | HR 59 | Wt 208.0 lb

## 2011-06-20 DIAGNOSIS — M7989 Other specified soft tissue disorders: Secondary | ICD-10-CM

## 2011-06-20 DIAGNOSIS — M545 Low back pain, unspecified: Secondary | ICD-10-CM

## 2011-06-20 DIAGNOSIS — I10 Essential (primary) hypertension: Secondary | ICD-10-CM

## 2011-06-20 DIAGNOSIS — Z125 Encounter for screening for malignant neoplasm of prostate: Secondary | ICD-10-CM

## 2011-06-20 DIAGNOSIS — M542 Cervicalgia: Secondary | ICD-10-CM

## 2011-06-20 MED ORDER — CYCLOBENZAPRINE HCL 10 MG PO TABS: 10.0000 mg | ORAL_TABLET | Freq: Every evening | ORAL | Status: AC | PRN

## 2011-06-20 NOTE — Patient Instructions (Signed)
We will call you with your lab results. If you don't here from us in about a week then please give us a call at [phone].  

## 2011-06-20 NOTE — Progress Notes (Signed)
  Subjective:    Patient ID: Thomas [name], male    DOB: 1950/06/16, 61 y.o.   MRN: [id number]  Hypertension This is a chronic problem. The current episode started more than 1 year ago. The problem is unchanged. The problem is controlled. Pertinent negatives include no chest pain or shortness of breath. Agents associated with hypertension include NSAIDs. Past treatments include angiotensin blockers, diuretics, calcium channel blockers and beta blockers. There are no compliance problems.    Low Back pain - Has been recurrent for years.  Finally saw ortho last year. Was getting better so  Never went to PT.  Has been more sedendary this winter. Sleep without a pillow bc of neck pain. Arms still going numb.  Using a heating pad on low back when first wakes up in the morning. Has been just using Advil.   Has intermittant lower extremity swelling but not daily. On HCTZ.  Usually swelling in the evening.    Review of Systems  Respiratory: Negative for shortness of breath.   Cardiovascular: Negative for chest pain.       Objective:   Physical Exam  Constitutional: He is oriented to person, place, and time. He appears well-developed and well-nourished.       adbominal obesity  HENT:  Head: Normocephalic and atraumatic.  Cardiovascular: Normal rate, regular rhythm and normal heart sounds.   Pulmonary/Chest: Effort normal and breath sounds normal.  Musculoskeletal:       Lumbar flexion, extension, rotation right and left, side bending are normal and symmetric. He is mildly tender over the upper lumbar spine, midthoracic spine and lower cervical spine. He has no SI joint tenderness. Upper extremities and lower 20s with normal range of motion but he does have somewhat decreased internal rotation of the shoulders but it is symmetric. Upper and lower extremity reflexes are 0+ bilaterally. Neck with normal flexion, extension, rotation right and left and side bending.  Neurological: He is alert and  oriented to person, place, and time.  Skin: Skin is warm and dry.  Psychiatric: He has a normal mood and affect. His behavior is normal.          Assessment & Plan:  Chronic low  Back pain - we had along discussion about treating his chronic pain. Recommend PT.  reduce his weight he will make significant improvement. In view of a baseline amount of chronic low back pain but certainly we can improve his flares and get him under better control and teach him ways to avoid injuring his back. We want off on narcotic pain relievers at this time. I will quit until a prescriptive for Flexeril at bedtime as needed for more severe pain. He says that his most difficult time in the day is when he tries to relax and go to sleep his back often keeps him from falling asleep.  Ankle swelling.- could be the Advil or salt intake. As indicated I am asked to see if this triggers his symptoms. Certainly low salt diet is best for him anyway with his high blood pressure. An ankle swelling persists, which he does not have today, and I recommend he followup.  Hypertension-well controlled. Looks great today. He is overdue for blood work so I gave him a lab slip today. He is fasting this morning.

## 2011-06-21 LAB — COMPLETE METABOLIC PANEL WITH GFR
BUN: 10 mg/dL (ref 6–23)
CO2: 27 mEq/L (ref 19–32)
Calcium: 9 mg/dL (ref 8.4–10.5)
Chloride: 106 mEq/L (ref 96–112)
Creat: 0.89 mg/dL (ref 0.50–1.35)
GFR, Est African American: >89 mL/min
GFR, Est Non African American: >89 mL/min
Glucose, Bld: 100 mg/dL — ABNORMAL HIGH (ref 70–99)
Total Bilirubin: 0.5 mg/dL (ref 0.3–1.2)

## 2011-06-21 LAB — LIPID PANEL
HDL: 41 mg/dL (ref >39)
Triglycerides: 250 mg/dL — ABNORMAL HIGH (ref <150)

## 2011-07-11 ENCOUNTER — Telehealth: Payer: Self-pay | Admitting: *Deleted

## 2011-07-11 MED ORDER — AMBULATORY NON FORMULARY MEDICATION: Status: DC

## 2011-07-11 NOTE — Telephone Encounter (Signed)
They need a prescription written for the pt to have PT for his back pain. States that the script needs to include what the PT is for. Needs to be faxed to [phone].

## 2011-07-11 NOTE — Telephone Encounter (Signed)
rx printed

## 2011-07-11 NOTE — Telephone Encounter (Signed)
RX faxed

## 2011-09-21 ENCOUNTER — Other Ambulatory Visit: Payer: Self-pay | Admitting: Family Medicine

## 2011-12-15 ENCOUNTER — Other Ambulatory Visit: Payer: Self-pay | Admitting: Gastroenterology

## 2012-02-06 ENCOUNTER — Encounter: Payer: Self-pay | Admitting: Internal Medicine

## 2012-03-05 ENCOUNTER — Encounter: Payer: Self-pay | Admitting: Internal Medicine

## 2012-03-06 ENCOUNTER — Ambulatory Visit (INDEPENDENT_AMBULATORY_CARE_PROVIDER_SITE_OTHER): Payer: Managed Care, Other (non HMO) | Admitting: Gastroenterology

## 2012-03-06 ENCOUNTER — Encounter: Payer: Self-pay | Admitting: Gastroenterology

## 2012-03-06 VITALS — BP 160/95 | HR 74 | Temp 97.4°F | Ht 69.0 in | Wt 205.0 lb

## 2012-03-06 DIAGNOSIS — I10 Essential (primary) hypertension: Secondary | ICD-10-CM

## 2012-03-06 DIAGNOSIS — K219 Gastro-esophageal reflux disease without esophagitis: Secondary | ICD-10-CM

## 2012-03-06 DIAGNOSIS — R1011 Right upper quadrant pain: Secondary | ICD-10-CM

## 2012-03-06 NOTE — Patient Instructions (Addendum)
Please have your blood work done. Please call if you are ready to schedule CT scan for your abdominal pain. Continue Nexium daily. Call Dr. Scharlene Gloss office about your blood pressure and that you are not taking BP medications.  Office visit as needed.

## 2012-03-06 NOTE — Progress Notes (Addendum)
Primary Care Physician: Dwana Melena, MD  Primary Gastroenterologist:  Roetta Sessions, MD   Chief Complaint  Patient presents with  . Follow-up    HPI: Thomas [name] is a 61 y.o. male here for one year f/u. Last seen in 03/2011. H/O chronic GERD and RUQ pain.  Previously noted symptoms worse with back pain and when stopped nexium previously. He now sees Dr. Dwana Melena as PCP. Patient is unable to tell us what meds he is on. He denies being on a BP pill. States he is on a cholesterol medication and Nexium. His BP is up today. 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   States two weeks ago, had another episode of RUQ swelling/ache/tenderness. Feels like right abd larger than left especially with these episodes. Always present but worse at times. Worse with overeating. Worse with gassy foods. Denies constipation/diarrhea, melena,brbpr. No heartburn on Nexium.   Last year when he was having these issues, he had a CT of the abdomen and pelvis in March of 2012. There was segmental mild thickening of the wall of the mid small bowel in the right abdomen. Questionable focal enteritis or focal IBD. MRE done for followup of these findings showed no small bowel wall thickening and previous signs were not appreciated. EGD and colonoscopy in May 2012 showed pancolonic diverticula, cecal polyp (tubular adenoma).   Current Outpatient Prescriptions  Medication Sig Dispense Refill  . NEXIUM 40 MG capsule TAKE 1 CAPSULE (40 MG TOTAL) BY MOUTH DAILY BEFORE BREAKFAST.  30 capsule  11  . simvastatin (ZOCOR) 20 MG tablet Take 20 mg by mouth every evening.      Marland Kitchen TRIBENZOR 40-5-25 MG TABS TAKE 1 TABLET BY MOUTH ONCE A DAY  30 tablet  3   patient unsure what medications he is taking.  Allergies as of 03/06/2012 - Review Complete 03/06/2012  Allergen Reaction Noted  . Simvastatin  12/03/2009    ROS:  General: Negative for anorexia, weight loss, fever, chills,  fatigue, weakness. ENT: Negative for hoarseness, difficulty swallowing , nasal congestion. CV: Negative for chest pain, angina, palpitations, dyspnea on exertion, peripheral edema.  Respiratory: Negative for dyspnea at rest, dyspnea on exertion, cough, sputum, wheezing.  GI: See history of present illness. GU:  Negative for dysuria, hematuria, urinary incontinence, urinary frequency, nocturnal urination.  Endo: Negative for unusual weight change.    Physical Examination:   Pulse 74  Temp 97.4 F (36.3 C) (Temporal)  Ht 5\' 9"  (1.753 m)  Wt 205 lb (92.987 kg)  BMI 30.27 kg/m2  General: Well-nourished, well-developed in no acute distress.  Eyes: No icterus. Mouth: Oropharyngeal mucosa moist and pink , no lesions erythema or exudate. Lungs: Clear to auscultation bilaterally.  Heart: Regular rate and rhythm, no murmurs rubs or gallops.  Abdomen: Bowel sounds are normal, mild right upper quadrant tenderness, nondistended, no hepatosplenomegaly or masses, no abdominal bruits or hernia , no rebound or guarding.   Extremities: No lower extremity edema. No clubbing or deformities. Neuro: Alert and oriented x 4   Skin: Warm and dry, no jaundice.   Psych: Alert and cooperative, normal mood and affect.

## 2012-03-07 ENCOUNTER — Encounter: Payer: Self-pay | Admitting: Gastroenterology

## 2012-03-08 NOTE — Progress Notes (Signed)
Faxed to PCP

## 2012-03-08 NOTE — Progress Notes (Signed)
Med list update as per pharmacy. Patient on simvastatin. This is listed as allergy. Patient stated he is allergic to medication for insomnia.

## 2012-03-08 NOTE — Assessment & Plan Note (Addendum)
Intermittent right upper quadrant pain, persistent now for the past 2 weeks however. Better at this point then when it first started again. Etiology not well-defined. Workup of small bowel abnormality as outlined above. MRE abdomen was okay. Offered patient repeat CT abdomen to make sure no recurrent small bowel issues such as wall thickening, tumor. At this point the patient is not interested in further imaging. He'll let us know if symptoms do not settle down and he prefers to pursue further workup.  Labs ordered.

## 2012-03-08 NOTE — Assessment & Plan Note (Signed)
I request patient to call Dr. Keane Police office to let them know about his blood pressure today and that he has not taken any blood pressure medication as they believe he is on. He stated that he would.

## 2012-03-08 NOTE — Assessment & Plan Note (Signed)
Continue Nexium 40 mg daily. Typical reflux symptoms well controlled.

## 2012-04-15 ENCOUNTER — Telehealth: Payer: Self-pay | Admitting: Gastroenterology

## 2012-04-15 NOTE — Telephone Encounter (Signed)
Patient never had labs done at time of OV 03/06/12. Please send him a reminder letter that if he is still having abdominal pain he should proceed with lab work.

## 2012-04-15 NOTE — Telephone Encounter (Signed)
Reminder letter mailed to pt. 

## 2012-05-22 LAB — COMPREHENSIVE METABOLIC PANEL
ALT: 31 U/L (ref 0–53)
CO2: 32 mEq/L (ref 19–32)
Calcium: 9.3 mg/dL (ref 8.4–10.5)
Chloride: 105 mEq/L (ref 96–112)
Glucose, Bld: 71 mg/dL (ref 70–99)
Sodium: 142 mEq/L (ref 135–145)
Total Bilirubin: 0.4 mg/dL (ref 0.3–1.2)
Total Protein: 6.7 g/dL (ref 6.0–8.3)

## 2012-05-22 LAB — CBC WITH DIFFERENTIAL/PLATELET
Basophils Relative: 0 % (ref 0–1)
Eosinophils Absolute: 0.2 10*3/uL (ref 0.0–0.7)
HCT: 42 % (ref 39.0–52.0)
Hemoglobin: 14.3 g/dL (ref 13.0–17.0)
Lymphs Abs: 2.8 10*3/uL (ref 0.7–4.0)
MCH: 26.8 pg (ref 26.0–34.0)
MCHC: 34 g/dL (ref 30.0–36.0)
Monocytes Absolute: 0.6 10*3/uL (ref 0.1–1.0)
Monocytes Relative: 7 % (ref 3–12)
Neutro Abs: 5.8 10*3/uL (ref 1.7–7.7)
Neutrophils Relative %: 62 % (ref 43–77)
RBC: 5.34 MIL/uL (ref 4.22–5.81)

## 2012-05-22 LAB — LIPASE: Lipase: 49 U/L (ref 0–75)

## 2012-06-26 NOTE — Progress Notes (Signed)
Quick Note:  Labs are all normal. I'm not sure why they are just now showing up; he was seen in Nov 2013. How is pt doing? ______

## 2012-07-03 NOTE — Progress Notes (Signed)
Quick Note:  Mailed letter. ______ 

## 2012-12-24 ENCOUNTER — Other Ambulatory Visit: Payer: Self-pay | Admitting: Gastroenterology

## 2014-01-22 ENCOUNTER — Other Ambulatory Visit (HOSPITAL_COMMUNITY): Payer: Self-pay | Admitting: Internal Medicine

## 2014-01-22 DIAGNOSIS — R109 Unspecified abdominal pain: Secondary | ICD-10-CM

## 2014-01-27 ENCOUNTER — Ambulatory Visit (HOSPITAL_COMMUNITY)
Admission: RE | Admit: 2014-01-27 | Discharge: 2014-01-27 | Disposition: A | Discharge status: Home / Self Care | Payer: BC Managed Care – PPO | Source: Ambulatory Visit | Attending: Internal Medicine | Admitting: Internal Medicine

## 2014-01-27 DIAGNOSIS — I708 Atherosclerosis of other arteries: Secondary | ICD-10-CM | POA: Insufficient documentation

## 2014-01-27 DIAGNOSIS — K7689 Other specified diseases of liver: Secondary | ICD-10-CM | POA: Insufficient documentation

## 2014-01-27 DIAGNOSIS — M47817 Spondylosis without myelopathy or radiculopathy, lumbosacral region: Secondary | ICD-10-CM | POA: Diagnosis not present

## 2014-01-27 DIAGNOSIS — K5730 Diverticulosis of large intestine without perforation or abscess without bleeding: Secondary | ICD-10-CM | POA: Diagnosis not present

## 2014-01-27 DIAGNOSIS — Q619 Cystic kidney disease, unspecified: Secondary | ICD-10-CM | POA: Insufficient documentation

## 2014-01-27 DIAGNOSIS — R109 Unspecified abdominal pain: Secondary | ICD-10-CM | POA: Diagnosis present

## 2014-01-28 ENCOUNTER — Encounter: Payer: Self-pay | Admitting: Cardiology

## 2014-01-28 ENCOUNTER — Ambulatory Visit (INDEPENDENT_AMBULATORY_CARE_PROVIDER_SITE_OTHER): Payer: BC Managed Care – PPO | Admitting: Cardiology

## 2014-01-28 VITALS — BP 162/100 | HR 60 | Ht 69.0 in | Wt 217.0 lb

## 2014-01-28 DIAGNOSIS — I10 Essential (primary) hypertension: Secondary | ICD-10-CM

## 2014-01-28 DIAGNOSIS — R0789 Other chest pain: Secondary | ICD-10-CM

## 2014-01-28 DIAGNOSIS — I498 Other specified cardiac arrhythmias: Secondary | ICD-10-CM

## 2014-01-28 DIAGNOSIS — E119 Type 2 diabetes mellitus without complications: Secondary | ICD-10-CM

## 2014-01-28 DIAGNOSIS — E785 Hyperlipidemia, unspecified: Secondary | ICD-10-CM

## 2014-01-28 DIAGNOSIS — R001 Bradycardia, unspecified: Secondary | ICD-10-CM

## 2014-01-28 MED ORDER — LISINOPRIL 20 MG PO TABS: 20.0000 mg | ORAL_TABLET | Freq: Every day | ORAL | Status: DC

## 2014-01-28 MED ORDER — ATORVASTATIN CALCIUM 40 MG PO TABS: 40.0000 mg | ORAL_TABLET | Freq: Every day | ORAL | Status: DC

## 2014-01-28 MED ORDER — HYDROCHLOROTHIAZIDE 25 MG PO TABS: 25.0000 mg | ORAL_TABLET | Freq: Every day | ORAL | Status: DC

## 2014-01-28 NOTE — Patient Instructions (Addendum)
Your physician recommends that you schedule a follow-up appointment in: 3 weeks with Dr. Harl Bowie  Your physician has recommended you make the following change in your medication:   STOP BYSTOLIC  STOP PRAVASTATIN  START LISINOPRIL 20 MG DAILY  START ATORVASTATIN 40 MG DAILY  START ASPIRIN 81 MG   START HCTZ 25 MG  Your physician recommends that you return for lab work in: [place] has requested that you have an echocardiogram. Echocardiography is a painless test that uses sound waves to create images of your heart. It provides your doctor with information about the size and shape of your heart and how well your heart's chambers and valves are working. This procedure takes approximately one hour. There are no restrictions for this procedure.  Thank you for choosing [facility]!!

## 2014-01-28 NOTE — Progress Notes (Signed)
Clinical Summary [name] is a 63 y.o.male seen today as a new patient for chest pain.  1. Chest pain - started approx 6 months - aching pressure in midchest, 7/10. Can occur at rest or with exertion. Can have some SOB with it, no other symptoms. Can be better at times with certain positions. - pain lasts up to 1 hour, can sometimes last several. Occurs daily, has noted increase frequency and severity - has noted some DOE with activities that he used to be able to tolerate. Occas LE edema  CAD risk factors: DM, HL, HTN   2. Bradycardia - can feel lightheaded at times, occurs daily. Mainly occurs with standing.   3. HTN - checks every few days, typically 160s/80-90s - compliant with meds   Past Medical History  Diagnosis Date  . Hiatal hernia   . Hyperlipidemia   . Renal cyst     Large Left  . HTN (hypertension)   . Sleep apnea   . Chronic back pain   . Adenomatous colon polyp 08/2010     Allergies  Allergen Reactions  . Other     Medication for insomnia  . Simvastatin     REACTION: arm numbness Patient currently on this medication (03/2012).     Current Outpatient Prescriptions  Medication Sig Dispense Refill  . NEXIUM 40 MG capsule TAKE 1 CAPSULE (40 MG TOTAL) BY MOUTH DAILY BEFORE BREAKFAST.  30 capsule  3  . simvastatin (ZOCOR) 20 MG tablet Take 20 mg by mouth every evening.      Marland Kitchen TRIBENZOR 40-5-25 MG TABS TAKE 1 TABLET BY MOUTH ONCE A DAY  30 tablet  3   No current facility-administered medications for this visit.     Past Surgical History  Procedure Laterality Date  . Appendectomy  1967  . Knee surgery  1995    Micro  . Cardiovascular stress test  2004    Treadmill Stress Test  . Cholecystectomy  07/2006  . Cardiovascular stress test  2010  . Colonoscopy  08/2010    RMR: Cecal tubular adenoma removed, otherwise normal exam. next tcs 08/2015  . Upper gastrointestinal endoscopy  08/2010    RMR: GERD benign biopsy     Allergies  Allergen  Reactions  . Other     Medication for insomnia  . Simvastatin     REACTION: arm numbness Patient currently on this medication (03/2012).      Family History  Problem Relation Age of Onset  . Lymphoma Mother   . Ulcers Father   . Ulcers Paternal Grandfather     Stomach ulcer, resection  . Heart attack Other   . Diabetes Other      Social History [name] reports that he has never smoked. He does not have any smokeless tobacco history on file. [name] reports that he drinks about one ounce of alcohol per week.   Review of Systems CONSTITUTIONAL: No weight loss, fever, chills, weakness or fatigue.  HEENT: Eyes: No visual loss, blurred vision, double vision or yellow sclerae.No hearing loss, sneezing, congestion, runny nose or sore throat.  SKIN: No rash or itching.  CARDIOVASCULAR: per HPI RESPIRATORY: No shortness of breath, cough or sputum.  GASTROINTESTINAL: No anorexia, nausea, vomiting or diarrhea. No abdominal pain or blood.  GENITOURINARY: No burning on urination, no polyuria NEUROLOGICAL: occas dizziness.  MUSCULOSKELETAL: No muscle, back pain, joint pain or stiffness.  LYMPHATICS: No enlarged nodes. No history of splenectomy.  PSYCHIATRIC: No history  of depression or anxiety.  ENDOCRINOLOGIC: No reports of sweating, cold or heat intolerance. No polyuria or polydipsia.  Marland Kitchen   Physical Examination p 60 bp 162/100 Wt 217 lbs BMI 32 Gen: resting comfortably, no acute distress HEENT: no scleral icterus, pupils equal round and reactive, no palptable cervical adenopathy,  CV: Regular, rate 55, no m/r/g,no JVD Resp: Clear to auscultation bilaterally GI: abdomen is soft, non-tender, non-distended, normal bowel sounds, no hepatosplenomegaly MSK: extremities are warm, no edema.  Skin: warm, no rash Neuro:  no focal deficits Psych: appropriate affect   Diagnostic Studies 01/28/14 EKG  Sinus brady, PAC, LAD with LAFB, probable anteroseptal Q waves    Assessment  and Plan  1. Chest pain  - unclear etiology, symptoms somewhat atypical for cardiac chest pain, however he has multiple CAD risk factors including DM and is at risk for atypical angina - EKG sinus brady, LAFB likely causing poor R wave progression, and Q waves suggestive of prior anteroseptal infarct - based on symptoms, risk factors, and abnormal EKG warrants further testing. Will start with echo, if decreased LVEF or WMA consider invasive ischemic testing, if not present then exercise cardiolite. - patient started on ASA 81mg  for primary prevention in DM patient  2. HTN - above goal - stop bystolic as he is bradycardic in clinic, start lisionpril 20mg  daily and HCTZ 25mg  daily. Check BMET in 2 weeks. Requires ACE-I for his HTN given his DM  3. Hyperlipidemia - given is DM should at least be on moderate dose statin based on most recent guidelines, will change prava to atorvastatin 40mg  daily.  4. SInus Bradycardia - likely due to bystolic, he does have some evidence of conduction disease with LAFB. - follow rates off bystolic  5. DM - just started on metforming, managed by pcp  F/u 3 weeks.       Thomas [name], M.D.

## 2014-01-30 ENCOUNTER — Ambulatory Visit: Payer: Managed Care, Other (non HMO) | Admitting: Cardiology

## 2014-01-30 ENCOUNTER — Encounter: Payer: Self-pay | Admitting: Cardiology

## 2014-02-02 ENCOUNTER — Ambulatory Visit (HOSPITAL_COMMUNITY): Payer: BC Managed Care – PPO

## 2014-02-03 LAB — BASIC METABOLIC PANEL WITH GFR
BUN: 9 mg/dL (ref 6–23)
CHLORIDE: 104 meq/L (ref 96–112)
CO2: 32 meq/L (ref 19–32)
CREATININE: 0.97 mg/dL (ref 0.50–1.35)
Calcium: 9 mg/dL (ref 8.4–10.5)
GFR, EST NON AFRICAN AMERICAN: 82 mL/min
GFR, Est African American: >89 mL/min
Glucose, Bld: 89 mg/dL (ref 70–99)
Potassium: 3.7 mEq/L (ref 3.5–5.3)
Sodium: 144 mEq/L (ref 135–145)

## 2014-02-05 ENCOUNTER — Ambulatory Visit (HOSPITAL_COMMUNITY)
Admission: RE | Admit: 2014-02-05 | Discharge: 2014-02-05 | Disposition: A | Discharge status: Home / Self Care | Payer: BC Managed Care – PPO | Source: Ambulatory Visit | Attending: Cardiology | Admitting: Cardiology

## 2014-02-05 DIAGNOSIS — E785 Hyperlipidemia, unspecified: Secondary | ICD-10-CM | POA: Diagnosis not present

## 2014-02-05 DIAGNOSIS — R079 Chest pain, unspecified: Secondary | ICD-10-CM | POA: Diagnosis present

## 2014-02-05 DIAGNOSIS — R0789 Other chest pain: Secondary | ICD-10-CM

## 2014-02-05 DIAGNOSIS — R0600 Dyspnea, unspecified: Secondary | ICD-10-CM | POA: Diagnosis not present

## 2014-02-05 DIAGNOSIS — I340 Nonrheumatic mitral (valve) insufficiency: Secondary | ICD-10-CM | POA: Insufficient documentation

## 2014-02-05 DIAGNOSIS — I10 Essential (primary) hypertension: Secondary | ICD-10-CM | POA: Diagnosis not present

## 2014-02-05 DIAGNOSIS — I371 Nonrheumatic pulmonary valve insufficiency: Secondary | ICD-10-CM | POA: Diagnosis not present

## 2014-02-05 DIAGNOSIS — E119 Type 2 diabetes mellitus without complications: Secondary | ICD-10-CM | POA: Diagnosis not present

## 2014-02-05 DIAGNOSIS — G4730 Sleep apnea, unspecified: Secondary | ICD-10-CM | POA: Diagnosis not present

## 2014-02-05 DIAGNOSIS — I379 Nonrheumatic pulmonary valve disorder, unspecified: Secondary | ICD-10-CM

## 2014-02-05 NOTE — Progress Notes (Signed)
  Echocardiogram 2D Echocardiogram has been performed.  [place], [place] 02/05/2014, 3:27 PM

## 2014-02-06 ENCOUNTER — Telehealth: Payer: Self-pay

## 2014-02-06 DIAGNOSIS — R072 Precordial pain: Secondary | ICD-10-CM

## 2014-02-06 NOTE — Telephone Encounter (Signed)
Message copied by Bernita Raisin on Fri Feb 06, 2014 12:59 PM ------      Message from: [place] F      Created: Fri Feb 06, 2014 12:33 PM       Echo overall looks good. He needs an exercise cardiolite for chest pain. He does not need to hold any meds.            Zandra Abts MD ------

## 2014-02-06 NOTE — Telephone Encounter (Signed)
Messaged tanya to sched-done

## 2014-02-17 ENCOUNTER — Encounter (HOSPITAL_COMMUNITY)
Admission: RE | Admit: 2014-02-17 | Discharge: 2014-02-17 | Disposition: A | Discharge status: Home / Self Care | Payer: BC Managed Care – PPO | Source: Ambulatory Visit | Attending: Cardiology | Admitting: Cardiology

## 2014-02-17 ENCOUNTER — Encounter (HOSPITAL_COMMUNITY): Payer: Self-pay

## 2014-02-17 ENCOUNTER — Ambulatory Visit (HOSPITAL_COMMUNITY)
Admission: RE | Admit: 2014-02-17 | Discharge: 2014-02-17 | Disposition: A | Discharge status: Home / Self Care | Payer: BC Managed Care – PPO | Source: Ambulatory Visit | Attending: Cardiology | Admitting: Cardiology

## 2014-02-17 DIAGNOSIS — I10 Essential (primary) hypertension: Secondary | ICD-10-CM | POA: Insufficient documentation

## 2014-02-17 DIAGNOSIS — R079 Chest pain, unspecified: Secondary | ICD-10-CM | POA: Diagnosis not present

## 2014-02-17 DIAGNOSIS — E785 Hyperlipidemia, unspecified: Secondary | ICD-10-CM | POA: Diagnosis not present

## 2014-02-17 DIAGNOSIS — R001 Bradycardia, unspecified: Secondary | ICD-10-CM | POA: Diagnosis present

## 2014-02-17 DIAGNOSIS — E119 Type 2 diabetes mellitus without complications: Secondary | ICD-10-CM | POA: Diagnosis not present

## 2014-02-17 DIAGNOSIS — R072 Precordial pain: Secondary | ICD-10-CM

## 2014-02-17 MED ORDER — SODIUM CHLORIDE 0.9 % IJ SOLN
INTRAMUSCULAR | Status: AC
  Administered 2014-02-17: 10 mL via INTRAVENOUS
  Filled 2014-02-17: qty 10

## 2014-02-17 MED ORDER — TECHNETIUM TC 99M SESTAMIBI - CARDIOLITE
30.0000 | Freq: Once | INTRAVENOUS | Status: AC | PRN
  Administered 2014-02-17: 10:00:00 30 via INTRAVENOUS

## 2014-02-17 MED ORDER — SODIUM CHLORIDE 0.9 % IJ SOLN
10.0000 mL | INTRAMUSCULAR | Status: DC | PRN
  Administered 2014-02-17: 10 mL via INTRAVENOUS

## 2014-02-17 MED ORDER — REGADENOSON 0.4 MG/5ML IV SOLN
INTRAVENOUS | Status: AC
  Filled 2014-02-17: qty 5

## 2014-02-17 MED ORDER — TECHNETIUM TC 99M SESTAMIBI GENERIC - CARDIOLITE
10.0000 | Freq: Once | INTRAVENOUS | Status: AC | PRN
  Administered 2014-02-17: 10 via INTRAVENOUS

## 2014-02-17 NOTE — Progress Notes (Signed)
Stress Lab Nurses Notes - Thomas [name]  Thomas [name] 02/17/2014 Reason for doing test: Chest Pain and Bradycardia Type of test: Stress Cardioite Nurse performing test: Gerrit Halls, RN Nuclear Medicine Tech: Redmond Baseman Echo Tech: Not Applicable MD performing test: S. McDowell/K.Purcell Nails NP Family MD: Nevada Crane Test explained and consent signed: Yes.   IV started: Saline lock flushed, No redness or edema and Saline lock started in radiology Symptoms: fatigue Treatment/Intervention: None Reason test stopped: protocol completed After recovery IV was: Discontinued via X-ray tech and No redness or edema Patient to return to Nuc. Med at : 10:45 Patient discharged: Home Patient's Condition upon discharge was: stable Comments: During test peak BP 225/110 & HR 139.  Recovery BP 166/105 & HR 90.  Symptoms resolved in recovery.  [name] T

## 2014-02-18 ENCOUNTER — Telehealth: Payer: Self-pay | Admitting: *Deleted

## 2014-02-18 NOTE — Telephone Encounter (Signed)
Forwarded to Dr. Nevada Crane. Pt has OV tomorrow.

## 2014-02-18 NOTE — Telephone Encounter (Signed)
Message copied by Desma Mcgregor on Wed Feb 18, 2014  4:29 PM ------      Message from: [place] F      Created: Wed Feb 18, 2014  3:14 PM       Overall stress test looks good. There is a very small questionable area on the bottom of the heart that may be a small blockage, however given its size its something we would just treat with medications. Discuss in further detail at follow up            Zandra Abts MD ------

## 2014-02-19 ENCOUNTER — Ambulatory Visit (INDEPENDENT_AMBULATORY_CARE_PROVIDER_SITE_OTHER): Payer: BC Managed Care – PPO | Admitting: Cardiology

## 2014-02-19 ENCOUNTER — Encounter: Payer: Self-pay | Admitting: Cardiology

## 2014-02-19 VITALS — BP 140/92 | HR 64 | Ht 69.0 in | Wt 212.0 lb

## 2014-02-19 DIAGNOSIS — R001 Bradycardia, unspecified: Secondary | ICD-10-CM

## 2014-02-19 DIAGNOSIS — I10 Essential (primary) hypertension: Secondary | ICD-10-CM

## 2014-02-19 DIAGNOSIS — E785 Hyperlipidemia, unspecified: Secondary | ICD-10-CM

## 2014-02-19 DIAGNOSIS — E119 Type 2 diabetes mellitus without complications: Secondary | ICD-10-CM

## 2014-02-19 DIAGNOSIS — R0789 Other chest pain: Secondary | ICD-10-CM

## 2014-02-19 MED ORDER — LISINOPRIL 40 MG PO TABS: 40.0000 mg | ORAL_TABLET | Freq: Every day | ORAL | Status: DC

## 2014-02-19 MED ORDER — NITROGLYCERIN 0.4 MG SL SUBL: 0.4000 mg | SUBLINGUAL_TABLET | SUBLINGUAL | Status: DC | PRN

## 2014-02-19 NOTE — Patient Instructions (Addendum)
Your physician wants you to follow-up in: 6 months You will receive a reminder letter in the mail two months in advance. If you don't receive a letter, please call our office to schedule the follow-up appointment.     Your physician has recommended you make the following change in your medication:   Please take Nitroglycerin as directed for chest pain, I have provided you written instruction on use  INCREASE Lisinopril to 40 mg daily    Please get blood work Artist) in 2 weeks      Thank you for choosing [facility] !

## 2014-02-19 NOTE — Progress Notes (Signed)
Clinical Summary Mr. Hemmelgarn is a 63 y.o.male seen today for follow up of the following medical problems.  1. Chest pain  - discussed at last visit, since that time was referred for cardiac testing - Echo 01/2014 LVEF 35-45%, grade I diastolic dysfunction - 62/5638 Lexiscan MPI mild apical inferolateral wall ischemia, low risk Duke treadmill score at 8.5, LVEF 44%. Overall low risk test - since last visit has had one episode of chest pain that was positional and tender to palpation  2. Bradycardia  - noted last visit, we stopped his bystolic at that time. Reports since stopping medication energy level has significantly improve.d   3. HTN  - checks every few days, typically 140s/90s  - compliant with meds    Past Medical History  Diagnosis Date  . Hiatal hernia   . Hyperlipidemia   . Renal cyst     Large Left  . HTN (hypertension)   . Sleep apnea   . Chronic back pain   . Adenomatous colon polyp 08/2010     Allergies  Allergen Reactions  . Other     Medication for insomnia  . Simvastatin     REACTION: arm numbness Patient currently on this medication (03/2012).     Current Outpatient Prescriptions  Medication Sig Dispense Refill  . aspirin 81 MG tablet Take 81 mg by mouth daily.      Marland Kitchen atorvastatin (LIPITOR) 40 MG tablet Take 1 tablet (40 mg total) by mouth daily.  90 tablet  3  . hydrochlorothiazide (HYDRODIURIL) 25 MG tablet Take 1 tablet (25 mg total) by mouth daily.  90 tablet  3  . lisinopril (PRINIVIL,ZESTRIL) 20 MG tablet Take 1 tablet (20 mg total) by mouth daily.  90 tablet  3  . NEXIUM 40 MG capsule TAKE 1 CAPSULE (40 MG TOTAL) BY MOUTH DAILY BEFORE BREAKFAST.  30 capsule  3  . TRIBENZOR 40-5-25 MG TABS TAKE 1 TABLET BY MOUTH ONCE A DAY  30 tablet  3   No current facility-administered medications for this visit.     Past Surgical History  Procedure Laterality Date  . Appendectomy  1967  . Knee surgery  1995    Micro  . Cardiovascular stress  test  2004    Treadmill Stress Test  . Cholecystectomy  07/2006  . Cardiovascular stress test  2010  . Colonoscopy  08/2010    RMR: Cecal tubular adenoma removed, otherwise normal exam. next tcs 08/2015  . Upper gastrointestinal endoscopy  08/2010    RMR: GERD benign biopsy     Allergies  Allergen Reactions  . Other     Medication for insomnia  . Simvastatin     REACTION: arm numbness Patient currently on this medication (03/2012).      Family History  Problem Relation Age of Onset  . Lymphoma Mother   . Ulcers Father   . Ulcers Paternal Grandfather     Stomach ulcer, resection  . Heart attack Other   . Diabetes Other      Social History Mr. Quiroa reports that he has never smoked. He has never used smokeless tobacco. Mr. Chicoine reports that he drinks about one ounce of alcohol per week.   Review of Systems CONSTITUTIONAL: No weight loss, fever, chills, weakness or fatigue.  HEENT: Eyes: No visual loss, blurred vision, double vision or yellow sclerae.No hearing loss, sneezing, congestion, runny nose or sore throat.  SKIN: No rash or itching.  CARDIOVASCULAR: per HPI RESPIRATORY: No  shortness of breath, cough or sputum.  GASTROINTESTINAL: No anorexia, nausea, vomiting or diarrhea. No abdominal pain or blood.  GENITOURINARY: No burning on urination, no polyuria NEUROLOGICAL: No headache, dizziness, syncope, paralysis, ataxia, numbness or tingling in the extremities. No change in bowel or bladder control.  MUSCULOSKELETAL: No muscle, back pain, joint pain or stiffness.  LYMPHATICS: No enlarged nodes. No history of splenectomy.  PSYCHIATRIC: No history of depression or anxiety.  ENDOCRINOLOGIC: No reports of sweating, cold or heat intolerance. No polyuria or polydipsia.  Marland Kitchen   Physical Examination p 64 bp 140/92 Wt 212 lbs BMI 31 Gen: resting comfortably, no acute distress HEENT: no scleral icterus, pupils equal round and reactive, no palptable cervical adenopathy,  CV:  RRR, no m/r/g, no JVD, no carotid bruits Resp: Clear to auscultation bilaterally GI: abdomen is soft, non-tender, non-distended, normal bowel sounds, no hepatosplenomegaly MSK: extremities are warm, no edema.  Skin: warm, no rash Neuro:  no focal deficits Psych: appropriate affect   Diagnostic Studies 01/2014 Echo Study Conclusions  - Left ventricle: The cavity size was normal. Wall thickness was increased in a pattern of mild LVH. Systolic function was normal. The estimated ejection fraction was in the range of 55% to 60%. Wall motion was normal; there were no regional wall motion abnormalities. Doppler parameters are consistent with abnormal left ventricular relaxation (grade 1 diastolic dysfunction). Doppler parameters are consistent with elevated mean left atrial filling pressure. Medial E/e&' 12.4. - Left atrium: The atrium was moderately dilated. - Right atrium: The atrium was mildly dilated. - Pulmonic valve: There was mild regurgitation.   01/2014 Lexiscan MPI IMPRESSION:  1. Abnormal study. Perfusion imaging suggests mild region of  ischemia in the mid apical inferolateral wall. The baseline GXT was  however low risk with Duke treadmill score of 8.5, and hypertensive  response.  2. No focal LV wall motion abnormalities by gated imaging.  3. Left ventricular ejection fraction 44% although appears better  visually.  4. Overall low-risk stress test findings*.      Assessment and Plan   1. Chest pain  - low risk stress test, echo with normal LVEF and no significant WMAs. MPI did have mild area of ischemia, but overall low risk study with low risk Duke score, will manage medically. No recurrent symptoms.  - continue CAD risk factor modification   2. HTN  - above goal, will increase lisinopril to 40mg  daily, repeat BMET in 2 weeks  3. Hyperlipidemia  - continue moderate dose statin in setting of DM  4. SInus Bradycardia  - resolved off bystolic  5. DM II  -  followed by pcp    F/u 6 months  Arnoldo Lenis, M.D.

## 2015-03-12 ENCOUNTER — Other Ambulatory Visit: Payer: Self-pay

## 2015-03-12 MED ORDER — LISINOPRIL 40 MG PO TABS: 40.0000 mg | ORAL_TABLET | Freq: Every day | ORAL | Status: DC

## 2015-03-12 MED ORDER — ATORVASTATIN CALCIUM 40 MG PO TABS: 40.0000 mg | ORAL_TABLET | Freq: Every day | ORAL | Status: DC

## 2015-03-12 MED ORDER — HYDROCHLOROTHIAZIDE 25 MG PO TABS: 25.0000 mg | ORAL_TABLET | Freq: Every day | ORAL | Status: DC

## 2015-03-12 MED ORDER — NITROGLYCERIN 0.4 MG SL SUBL: 0.4000 mg | SUBLINGUAL_TABLET | SUBLINGUAL | Status: DC | PRN

## 2015-04-28 ENCOUNTER — Ambulatory Visit: Payer: Self-pay | Admitting: Cardiology

## 2015-05-03 DIAGNOSIS — E1165 Type 2 diabetes mellitus with hyperglycemia: Secondary | ICD-10-CM | POA: Diagnosis not present

## 2015-05-03 DIAGNOSIS — I2510 Atherosclerotic heart disease of native coronary artery without angina pectoris: Secondary | ICD-10-CM | POA: Diagnosis not present

## 2015-05-03 DIAGNOSIS — I10 Essential (primary) hypertension: Secondary | ICD-10-CM | POA: Diagnosis not present

## 2015-05-03 DIAGNOSIS — E782 Mixed hyperlipidemia: Secondary | ICD-10-CM | POA: Diagnosis not present

## 2015-05-14 ENCOUNTER — Other Ambulatory Visit: Payer: Self-pay | Admitting: Cardiology

## 2015-05-14 ENCOUNTER — Other Ambulatory Visit: Payer: Self-pay

## 2015-05-14 MED ORDER — LISINOPRIL 40 MG PO TABS: 40.0000 mg | ORAL_TABLET | Freq: Every day | ORAL | Status: DC

## 2015-05-14 NOTE — Telephone Encounter (Signed)
15 day supply as pt already given 30 day,needs fu

## 2015-06-24 ENCOUNTER — Ambulatory Visit (INDEPENDENT_AMBULATORY_CARE_PROVIDER_SITE_OTHER): Payer: Medicare HMO | Admitting: Cardiology

## 2015-06-24 ENCOUNTER — Encounter: Payer: Self-pay | Admitting: Cardiology

## 2015-06-24 VITALS — BP 148/98 | HR 71 | Ht 69.0 in | Wt 212.0 lb

## 2015-06-24 DIAGNOSIS — R0600 Dyspnea, unspecified: Secondary | ICD-10-CM

## 2015-06-24 DIAGNOSIS — R0789 Other chest pain: Secondary | ICD-10-CM

## 2015-06-24 DIAGNOSIS — R0609 Other forms of dyspnea: Secondary | ICD-10-CM | POA: Diagnosis not present

## 2015-06-24 DIAGNOSIS — E785 Hyperlipidemia, unspecified: Secondary | ICD-10-CM | POA: Diagnosis not present

## 2015-06-24 DIAGNOSIS — I10 Essential (primary) hypertension: Secondary | ICD-10-CM

## 2015-06-24 NOTE — Patient Instructions (Signed)
Your physician recommends that you schedule a follow-up appointment in:  1 month Dr Harl Bowie     STOP Micardis  STOP Lisinopril  Keep BP log for 1 week and then drop off at office      Thank you for choosing [facility] !

## 2015-06-24 NOTE — Progress Notes (Addendum)
Patient ID: Thomas [name], male   DOB: Feb 19, 1951, 65 y.o.   MRN: [id number]     Clinical Summary Thomas [name] is a 65 y.o.male seen today for follow up of the following medical problems.   1. History of chest pain  - Echo 01/2014 LVEF 0000000, grade I diastolic dysfunction - [id number] Lexiscan MPI mild apical inferolateral wall ischemia, low risk Duke treadmill score at 8.5, LVEF 44%. Overall low risk test  - no recent chest pain since last visit    2. Bradycardia  - noted last visit, we stopped his bystolic at that time. Reports since stopping medication energy level has significantly improved  - denies any recent symptoms  3. HTN  - checks every few days, typically 140s/90s  - compliant with meds. Reports pcp stopped lisionpril and changed to micardis, which he ran out of recently.  - mixed compliance with meds  4. Palpitations - episode of flutterring a few weeks ago. Roughly 3-4 episodes. Lasted just a few seconds. Felt mildly lightheaded.  - coffee  x 1-2 cups, occasional sodas cokes, iced tea 4-5 days a week, no energy drinks, infrequent alcohol   5. DOE - notes some symptoms over the last few weeks, example walking 1 flight of stairs. No chest pain. - can have some occasional LE edema. Of note he has not been compliant with his HCTZ over the last few weeks.   SH: works as Clinical biochemist Past Medical History  Diagnosis Date  . Hiatal hernia   . Hyperlipidemia   . Renal cyst     Large Left  . HTN (hypertension)   . Sleep apnea   . Chronic back pain   . Adenomatous colon polyp 08/2010     Allergies  Allergen Reactions  . Other     Medication for insomnia  . Simvastatin     REACTION: arm numbness Patient currently on this medication (03/2012).     Current Outpatient Prescriptions  Medication Sig Dispense Refill  . aspirin 81 MG tablet Take 81 mg by mouth daily.    Marland Kitchen atorvastatin (LIPITOR) 40 MG tablet TAKE 1 TABLET (40 MG TOTAL) BY MOUTH DAILY. 30 tablet 0    . hydrochlorothiazide (HYDRODIURIL) 25 MG tablet TAKE 1 TABLET (25 MG TOTAL) BY MOUTH DAILY. 30 tablet 0  . lisinopril (PRINIVIL,ZESTRIL) 40 MG tablet Take 1 tablet (40 mg total) by mouth daily. 15 tablet 0  . metFORMIN (GLUCOPHAGE) 500 MG tablet Take 500 mg by mouth 2 (two) times daily with a meal.    . NEXIUM 40 MG capsule TAKE 1 CAPSULE (40 MG TOTAL) BY MOUTH DAILY BEFORE BREAKFAST. 30 capsule 3  . nitroGLYCERIN (NITROSTAT) 0.4 MG SL tablet Place 1 tablet (0.4 mg total) under the tongue every 5 (five) minutes as needed for chest pain. 25 tablet 1   No current facility-administered medications for this visit.     Past Surgical History  Procedure Laterality Date  . Appendectomy  1967  . Knee surgery  1995    Micro  . Cardiovascular stress test  2004    Treadmill Stress Test  . Cholecystectomy  07/2006  . Cardiovascular stress test  2010  . Colonoscopy  08/2010    RMR: Cecal tubular adenoma removed, otherwise normal exam. next tcs 08/2015  . Upper gastrointestinal endoscopy  08/2010    RMR: GERD benign biopsy     Allergies  Allergen Reactions  . Other     Medication for insomnia  . Simvastatin  REACTION: arm numbness Patient currently on this medication (03/2012).      Family History  Problem Relation Age of Onset  . Lymphoma Mother   . Ulcers Father   . Ulcers Paternal Grandfather     Stomach ulcer, resection  . Heart attack Other   . Diabetes Other      Social History Thomas [name] reports that he has never smoked. He has never used smokeless tobacco. Thomas [name] reports that he drinks about 1.0 oz of alcohol per week.   Review of Systems CONSTITUTIONAL: No weight loss, fever, chills, weakness or fatigue.  HEENT: Eyes: No visual loss, blurred vision, double vision or yellow sclerae.No hearing loss, sneezing, congestion, runny nose or sore throat.  SKIN: No rash or itching.  CARDIOVASCULAR: per hpi RESPIRATORY: No cough or sputum.  GASTROINTESTINAL: No anorexia,  nausea, vomiting or diarrhea. No abdominal pain or blood.  GENITOURINARY: No burning on urination, no polyuria NEUROLOGICAL: No headache, dizziness, syncope, paralysis, ataxia, numbness or tingling in the extremities. No change in bowel or bladder control.  MUSCULOSKELETAL: No muscle, back pain, joint pain or stiffness.  LYMPHATICS: No enlarged nodes. No history of splenectomy.  PSYCHIATRIC: No history of depression or anxiety.  ENDOCRINOLOGIC: No reports of sweating, cold or heat intolerance. No polyuria or polydipsia.  Marland Kitchen   Physical Examination Filed Vitals:   06/24/15 0847  BP: 148/98  Pulse: 71   Filed Vitals:   06/24/15 0847  Height: 5\' 9"  (1.753 m)  Weight: 212 lb (96.163 kg)    Gen: resting comfortably, no acute distress HEENT: no scleral icterus, pupils equal round and reactive, no palptable cervical adenopathy,  CV: RRR, no m/r/g, no jvd Resp: Clear to auscultation bilaterally GI: abdomen is soft, non-tender, non-distended, normal bowel sounds, no hepatosplenomegaly MSK: extremities are warm, 1+ bilateral edema  Skin: warm, no rash Neuro:  no focal deficits Psych: appropriate affect   Diagnostic Studies  01/2014 Echo Study Conclusions  - Left ventricle: The cavity size was normal. Wall thickness was increased in a pattern of mild LVH. Systolic function was normal. The estimated ejection fraction was in the range of 55% to 60%. Wall motion was normal; there were no regional wall motion abnormalities. Doppler parameters are consistent with abnormal left ventricular relaxation (grade 1 diastolic dysfunction). Doppler parameters are consistent with elevated mean left atrial filling pressure. Medial E/e&' 12.4. - Left atrium: The atrium was moderately dilated. - Right atrium: The atrium was mildly dilated. - Pulmonic valve: There was mild regurgitation.   01/2014 Lexiscan MPI IMPRESSION:  1. Abnormal study. Perfusion imaging suggests mild region of  ischemia  in the mid apical inferolateral wall. The baseline GXT was  however low risk with Duke treadmill score of 8.5, and hypertensive  response.  2. No focal LV wall motion abnormalities by gated imaging.  3. Left ventricular ejection fraction 44% although appears better  visually.  4. Overall low-risk stress test findings*.  06/24/15 Clinic EKG(performed and reviewed in clinic): SR, poor R wave progression Assessment and Plan   1. Chest pain  - low risk stress test, echo with normal LVEF and no significant WMAs. MPI did have mild area of ischemia, but overall low risk study with low risk Duke score - continue to monitor  2. HTN  - elevated in clinic, poor compliance with meds - he reports lisionpril changed to micardis recently, he is unsure why. Will ask for pcp note.States he had micardis samples but ran out some time ago. - he  will take HCTZ 25mg  daily and submit bp log, pending numbers will see if needs additional agent.   3. Hyperlipidemia  - continue moderate dose statin - request pcp labs  4. SInus Bradycardia  - resolved off bystolic - continue to monitor  5. DM II  - followed by pcp  6. DOE - he stopped his HCTZ on his own, has evidence of fluid overload - restart HCTZ and f/u symptoms   Arnoldo Lenis, M.D.

## 2015-07-23 ENCOUNTER — Ambulatory Visit (INDEPENDENT_AMBULATORY_CARE_PROVIDER_SITE_OTHER): Payer: Medicare HMO | Admitting: Cardiology

## 2015-07-23 ENCOUNTER — Encounter: Payer: Self-pay | Admitting: Cardiology

## 2015-07-23 VITALS — BP 150/90 | HR 66 | Ht 69.0 in | Wt 214.0 lb

## 2015-07-23 DIAGNOSIS — Z79899 Other long term (current) drug therapy: Secondary | ICD-10-CM

## 2015-07-23 DIAGNOSIS — I10 Essential (primary) hypertension: Secondary | ICD-10-CM

## 2015-07-23 DIAGNOSIS — R079 Chest pain, unspecified: Secondary | ICD-10-CM | POA: Diagnosis not present

## 2015-07-23 DIAGNOSIS — E785 Hyperlipidemia, unspecified: Secondary | ICD-10-CM | POA: Diagnosis not present

## 2015-07-23 MED ORDER — LISINOPRIL 40 MG PO TABS: 40.0000 mg | ORAL_TABLET | Freq: Every day | ORAL | Status: DC

## 2015-07-23 MED ORDER — NITROGLYCERIN 0.4 MG SL SUBL: 0.4000 mg | SUBLINGUAL_TABLET | SUBLINGUAL | Status: DC | PRN

## 2015-07-23 MED ORDER — HYDROCHLOROTHIAZIDE 25 MG PO TABS
ORAL_TABLET | ORAL | Status: DC
Start: 2015-07-23 — End: 2015-08-13

## 2015-07-23 MED ORDER — ATORVASTATIN CALCIUM 40 MG PO TABS: ORAL_TABLET | ORAL | Status: DC

## 2015-07-23 NOTE — Progress Notes (Signed)
Patient ID: Thomas [name], male   DOB: Sep 15, 1950, 65 y.o.   MRN: [id number]     Clinical Summary [name] is a 65 y.o.male seen today for follow up of the following medical problems.  1. History of chest pain  - Echo 01/2014 LVEF 0000000, grade I diastolic dysfunction - Q000111Q exercise MPI mild apical inferolateral wall ischemia, low risk Duke treadmill score at 8.5, LVEF 44%. Overall low risk test  - recent episodes of chest pain, occurs twice a week. Aching pain in midchest, can be tender to palpation at times. Can have pain in midback. Sometimes relieved with prn advil.      2. Bradycardia  - resolved after stopping bystolic. Heart rates from recent bp log 60s-70s  3. HTN  -  submitted bp log, bp's are elevated 150s-170s/80-90s  4. Palpitations - symptoms resolved after stopping caffeine  5. Oto continues. For example notices he is SOB walking up just 1 flight of stairs.   6. Dizziness - primarily occurs with changes of position, turning head. Not specifically with standing. Not associated with any palpitations   Past Medical History  Diagnosis Date  . Hiatal hernia   . Hyperlipidemia   . Renal cyst     Large Left  . HTN (hypertension)   . Sleep apnea   . Chronic back pain   . Adenomatous colon polyp 08/2010     Allergies  Allergen Reactions  . Other     Medication for insomnia  . Simvastatin     REACTION: arm numbness Patient currently on this medication (03/2012).     Current Outpatient Prescriptions  Medication Sig Dispense Refill  . aspirin 81 MG tablet Take 81 mg by mouth daily.    Marland Kitchen atorvastatin (LIPITOR) 40 MG tablet TAKE 1 TABLET (40 MG TOTAL) BY MOUTH DAILY. 30 tablet 0  . hydrochlorothiazide (HYDRODIURIL) 25 MG tablet TAKE 1 TABLET (25 MG TOTAL) BY MOUTH DAILY. 30 tablet 0  . metFORMIN (GLUCOPHAGE) 500 MG tablet Take 500 mg by mouth 2 (two) times daily with a meal.    . NEXIUM 40 MG capsule TAKE 1 CAPSULE (40 MG TOTAL) BY MOUTH DAILY  BEFORE BREAKFAST. (Patient not taking: Reported on 06/24/2015) 30 capsule 3  . nitroGLYCERIN (NITROSTAT) 0.4 MG SL tablet Place 1 tablet (0.4 mg total) under the tongue every 5 (five) minutes as needed for chest pain. 25 tablet 1   No current facility-administered medications for this visit.     Past Surgical History  Procedure Laterality Date  . Appendectomy  1967  . Knee surgery  1995    Micro  . Cardiovascular stress test  2004    Treadmill Stress Test  . Cholecystectomy  07/2006  . Cardiovascular stress test  2010  . Colonoscopy  08/2010    RMR: Cecal tubular adenoma removed, otherwise normal exam. next tcs 08/2015  . Upper gastrointestinal endoscopy  08/2010    RMR: GERD benign biopsy     Allergies  Allergen Reactions  . Other     Medication for insomnia  . Simvastatin     REACTION: arm numbness Patient currently on this medication (03/2012).      Family History  Problem Relation Age of Onset  . Lymphoma Mother   . Ulcers Father   . Ulcers Paternal Grandfather     Stomach ulcer, resection  . Heart attack Other   . Diabetes Other      Social History [name] reports that he has  never smoked. He has never used smokeless tobacco. [name] reports that he drinks about 1.0 oz of alcohol per week.   Review of Systems CONSTITUTIONAL: No weight loss, fever, chills, weakness or fatigue.  HEENT: Eyes: No visual loss, blurred vision, double vision or yellow sclerae.No hearing loss, sneezing, congestion, runny nose or sore throat.  SKIN: No rash or itching.  CARDIOVASCULAR: per HPI RESPIRATORY: No shortness of breath, cough or sputum.  GASTROINTESTINAL: No anorexia, nausea, vomiting or diarrhea. No abdominal pain or blood.  GENITOURINARY: No burning on urination, no polyuria NEUROLOGICAL: +dizziness MUSCULOSKELETAL: No muscle, back pain, joint pain or stiffness.  LYMPHATICS: No enlarged nodes. No history of splenectomy.  PSYCHIATRIC: No history of depression or  anxiety.  ENDOCRINOLOGIC: No reports of sweating, cold or heat intolerance. No polyuria or polydipsia.  Marland Kitchen   Physical Examination Filed Vitals:   07/23/15 0841  BP: 150/90  Pulse: 66   Filed Vitals:   07/23/15 0841  Height: 5\' 9"  (1.753 m)  Weight: 214 lb (97.07 kg)    Gen: resting comfortably, no acute distress HEENT: no scleral icterus, pupils equal round and reactive, no palptable cervical adenopathy,  CV: RRR, no mr/g, no jvd Resp: Clear to auscultation bilaterally GI: abdomen is soft, non-tender, non-distended, normal bowel sounds, no hepatosplenomegaly MSK: extremities are warm, no edema.  Skin: warm, no rash Neuro:  no focal deficits Psych: appropriate affect   Diagnostic Studies  01/2014 Echo Study Conclusions  - Left ventricle: The cavity size was normal. Wall thickness was increased in a pattern of mild LVH. Systolic function was normal. The estimated ejection fraction was in the range of 55% to 60%. Wall motion was normal; there were no regional wall motion abnormalities. Doppler parameters are consistent with abnormal left ventricular relaxation (grade 1 diastolic dysfunction). Doppler parameters are consistent with elevated mean left atrial filling pressure. Medial E/e&' 12.4. - Left atrium: The atrium was moderately dilated. - Right atrium: The atrium was mildly dilated. - Pulmonic valve: There was mild regurgitation.   01/2014 Lexiscan MPI IMPRESSION:  1. Abnormal study. Perfusion imaging suggests mild region of  ischemia in the mid apical inferolateral wall. The baseline GXT was  however low risk with Duke treadmill score of 8.5, and hypertensive  response.  2. No focal LV wall motion abnormalities by gated imaging.  3. Left ventricular ejection fraction 44% although appears better  visually.  4. Overall low-risk stress test findings*.   Assessment and Plan   1. Chest pain  - continued chest pain symptoms. Prior stress test i 2015  did show small area of ischemia that was overall low risk, Duke treadmill score was low risk. Progressive DOE. We will repeat exercise cardiolote.   2. HTN  - above goal. We will restart his lisinopril 40mg  daily, check BMET in 2 weeks. He will submit bp log in 2 weeks - give info on DASH diet  3. Hyperlipidemia  - continue statin  4. Sinus Bradycardia  - resolved off bystolic - continue to monitor  5. DM II  - followed by pcp  6. DOE - progressing symptoms We will repeat exercise cardiolite to assess exercise capacity and see if any progression of ischemic territory.   7. Dizziness - symptoms suggestive of vertigo, defer to pcp.   F/u 1 month    Arnoldo Lenis, M.D.

## 2015-07-23 NOTE — Patient Instructions (Signed)
DASH Eating Plan DASH stands for "Dietary Approaches to Stop Hypertension." The DASH eating plan is a healthy eating plan that has been shown to reduce high blood pressure (hypertension). Additional health benefits may include reducing the risk of type 2 diabetes mellitus, heart disease, and stroke. The DASH eating plan may also help with weight loss. WHAT DO I NEED TO KNOW ABOUT THE DASH EATING PLAN? For the DASH eating plan, you will follow these general guidelines:  Choose foods with a percent daily value for sodium of less than 5% (as listed on the food label).  Use salt-free seasonings or herbs instead of table salt or sea salt.  Check with your health care provider or pharmacist before using salt substitutes.  Eat lower-sodium products, often labeled as "lower sodium" or "no salt added."  Eat fresh foods.  Eat more vegetables, fruits, and low-fat dairy products.  Choose whole grains. Look for the word "whole" as the first word in the ingredient list.  Choose fish and skinless chicken or turkey more often than red meat. Limit fish, poultry, and meat to 6 oz (170 g) each day.  Limit sweets, desserts, sugars, and sugary drinks.  Choose heart-healthy fats.  Limit cheese to 1 oz (28 g) per day.  Eat more home-cooked food and less restaurant, buffet, and fast food.  Limit fried foods.  Cook foods using methods other than frying.  Limit canned vegetables. If you do use them, rinse them well to decrease the sodium.  When eating at a restaurant, ask that your food be prepared with less salt, or no salt if possible. WHAT FOODS CAN I EAT? Seek help from a dietitian for individual calorie needs. Grains Whole grain or whole wheat bread. Brown rice. Whole grain or whole wheat pasta. Quinoa, bulgur, and whole grain cereals. Low-sodium cereals. Corn or whole wheat flour tortillas. Whole grain cornbread. Whole grain crackers. Low-sodium crackers. Vegetables Fresh or frozen vegetables  (raw, steamed, roasted, or grilled). Low-sodium or reduced-sodium tomato and vegetable juices. Low-sodium or reduced-sodium tomato sauce and paste. Low-sodium or reduced-sodium canned vegetables.  Fruits All fresh, canned (in natural juice), or frozen fruits. Meat and Other Protein Products Ground beef (85% or leaner), grass-fed beef, or beef trimmed of fat. Skinless chicken or turkey. Ground chicken or turkey. Pork trimmed of fat. All fish and seafood. Eggs. Dried beans, peas, or lentils. Unsalted nuts and seeds. Unsalted canned beans. Dairy Low-fat dairy products, such as skim or 1% milk, 2% or reduced-fat cheeses, low-fat ricotta or cottage cheese, or plain low-fat yogurt. Low-sodium or reduced-sodium cheeses. Fats and Oils Tub margarines without trans fats. Light or reduced-fat mayonnaise and salad dressings (reduced sodium). Avocado. Safflower, olive, or canola oils. Natural peanut or almond butter. Other Unsalted popcorn and pretzels. The items listed above may not be a complete list of recommended foods or beverages. Contact your dietitian for more options. WHAT FOODS ARE NOT RECOMMENDED? Grains White bread. White pasta. White rice. Refined cornbread. Bagels and croissants. Crackers that contain trans fat. Vegetables Creamed or fried vegetables. Vegetables in a cheese sauce. Regular canned vegetables. Regular canned tomato sauce and paste. Regular tomato and vegetable juices. Fruits Dried fruits. Canned fruit in light or heavy syrup. Fruit juice. Meat and Other Protein Products Fatty cuts of meat. Ribs, chicken wings, bacon, sausage, bologna, salami, chitterlings, fatback, hot dogs, bratwurst, and packaged luncheon meats. Salted nuts and seeds. Canned beans with salt. Dairy Whole or 2% milk, cream, half-and-half, and cream cheese. Whole-fat or sweetened yogurt. Full-fat   cheeses or blue cheese. Nondairy creamers and whipped toppings. Processed cheese, cheese spreads, or cheese  curds. Condiments Onion and garlic salt, seasoned salt, table salt, and sea salt. Canned and packaged gravies. Worcestershire sauce. Tartar sauce. Barbecue sauce. Teriyaki sauce. Soy sauce, including reduced sodium. Steak sauce. Fish sauce. Oyster sauce. Cocktail sauce. Horseradish. Ketchup and mustard. Meat flavorings and tenderizers. Bouillon cubes. Hot sauce. Tabasco sauce. Marinades. Taco seasonings. Relishes. Fats and Oils Butter, stick margarine, lard, shortening, ghee, and bacon fat. Coconut, palm kernel, or palm oils. Regular salad dressings. Other Pickles and olives. Salted popcorn and pretzels. The items listed above may not be a complete list of foods and beverages to avoid. Contact your dietitian for more information. WHERE CAN I FIND MORE INFORMATION? National Heart, Lung, and Blood Institute: travelstabloid.com   This information is not intended to replace advice given to you by your health care provider. Make sure you discuss any questions you have with your health care provider.   Document Released: 04/06/2011 Document Revised: 05/08/2014 Document Reviewed: 02/19/2013 Elsevier Interactive Patient Education 2016 [employer]  Medication Instructions:  START LISINOPRIL 40 MG DAILY  Labwork: Your physician recommends that you return for lab work in: 2 WEEKS  BMET TSH  Testing/Procedures: Your physician has requested that you have en exercise stress myoview. For further information please visit [email or web address]. Please follow instruction sheet, as given.      Follow-Up: Your physician recommends that you schedule a follow-up appointment in: [place] DR[address] BRAN[facility]    Any Other Special Instructions Will Be Listed Below (If Applicable).   KEEP A BLOOD PRESSURE LOG FOR 2 WEEKS & BRING IT WITH YOU TO YOUR NEXT APPOINTMENT  If you need a refill on your cardiac medications before your next appointment, please call your  pharmacy.

## 2015-07-27 ENCOUNTER — Encounter: Payer: Self-pay | Admitting: Internal Medicine

## 2015-08-02 ENCOUNTER — Encounter (HOSPITAL_COMMUNITY)
Admission: RE | Admit: 2015-08-02 | Discharge: 2015-08-02 | Disposition: A | Discharge status: Home / Self Care | Payer: Medicare HMO | Source: Ambulatory Visit | Attending: Cardiology | Admitting: Cardiology

## 2015-08-02 ENCOUNTER — Encounter (HOSPITAL_COMMUNITY): Payer: Self-pay

## 2015-08-02 ENCOUNTER — Inpatient Hospital Stay (HOSPITAL_COMMUNITY): Admission: RE | Admit: 2015-08-02 | Payer: Medicare HMO | Source: Ambulatory Visit

## 2015-08-02 DIAGNOSIS — R079 Chest pain, unspecified: Secondary | ICD-10-CM | POA: Diagnosis not present

## 2015-08-02 DIAGNOSIS — R931 Abnormal findings on diagnostic imaging of heart and coronary circulation: Secondary | ICD-10-CM | POA: Diagnosis not present

## 2015-08-02 LAB — NM MYOCAR MULTI W/SPECT W/WALL MOTION / EF
CHL CUP NUCLEAR SDS: 4
CHL CUP NUCLEAR SRS: 1
CHL CUP NUCLEAR SSS: 5
CHL RATE OF PERCEIVED EXERTION: 16
CSEPEW: 10.1 METS
CSEPHR: 91 %
Exercise duration (min): 7 min
Exercise duration (sec): 30 s
LHR: 0.44
LV dias vol: 148 mL (ref 62–150)
LV sys vol: 93 mL
MPHR: 154 {beats}/min
Peak HR: 141 {beats}/min
Rest HR: 60 {beats}/min
TID: 1.11

## 2015-08-02 MED ORDER — TECHNETIUM TC 99M SESTAMIBI - CARDIOLITE
30.0000 | Freq: Once | INTRAVENOUS | Status: AC | PRN
  Administered 2015-08-02: 30 via INTRAVENOUS

## 2015-08-02 MED ORDER — SODIUM CHLORIDE 0.9% FLUSH
INTRAVENOUS | Status: AC
  Administered 2015-08-02: 10 mL via INTRAVENOUS
  Filled 2015-08-02: qty 10

## 2015-08-02 MED ORDER — REGADENOSON 0.4 MG/5ML IV SOLN
INTRAVENOUS | Status: AC
  Filled 2015-08-02: qty 5

## 2015-08-02 MED ORDER — TECHNETIUM TC 99M SESTAMIBI GENERIC - CARDIOLITE
10.0000 | Freq: Once | INTRAVENOUS | Status: AC | PRN
  Administered 2015-08-02: 10 via INTRAVENOUS

## 2015-08-05 ENCOUNTER — Encounter: Payer: Medicare HMO | Admitting: Cardiology

## 2015-08-05 NOTE — Progress Notes (Signed)
Patient ID: Thomas [name], male   DOB: May 16, 1950, 65 y.o.   MRN: [id number]     Clinical Summary Thomas [name] is a 65 y.o.male  1. History of chest pain  - Echo 01/2014 LVEF 0000000, grade I diastolic dysfunction - Q000111Q exercise MPI mild apical inferolateral wall ischemia, low risk Duke treadmill score at 8.5, LVEF 44%. Overall low risk test  - recent episodes of chest pain, occurs twice a week. Aching pain in midchest, can be tender to palpation at times. Can have pain in midback. Sometimes relieved with prn advil.   - since last visit completed exercise MPI with a Duke treadmill score of 3.5, small interoalateral ischemia, LVEF 37% though appears higher visually.    2. Bradycardia  - resolved after stopping bystolic. Heart rates from recent bp log 60s-70s  3. HTN  - submitted bp log, bp's are elevated 150s-170s/80-90s  4. Palpitations - symptoms resolved after stopping caffeine  5. [name] continues. For example notices he is SOB walking up just 1 flight of stairs.   6. Dizziness - primarily occurs with changes of position, turning head. Not specifically with standing. Not associated with any palpitations Past Medical History  Diagnosis Date  . Hiatal hernia   . Hyperlipidemia   . Renal cyst     Large Left  . HTN (hypertension)   . Sleep apnea   . Chronic back pain   . Adenomatous colon polyp 08/2010     Allergies  Allergen Reactions  . Other     Medication for insomnia  . Simvastatin     REACTION: arm numbness Patient currently on this medication (03/2012).     Current Outpatient Prescriptions  Medication Sig Dispense Refill  . aspirin 81 MG tablet Take 81 mg by mouth daily.    Marland Kitchen atorvastatin (LIPITOR) 40 MG tablet TAKE 1 TABLET (40 MG TOTAL) BY MOUTH DAILY. 30 tablet 3  . hydrochlorothiazide (HYDRODIURIL) 25 MG tablet TAKE 1 TABLET (25 MG TOTAL) BY MOUTH DAILY. 90 tablet 3  . lisinopril (PRINIVIL,ZESTRIL) 40 MG tablet Take 1 tablet (40 mg total)  by mouth daily. 90 tablet 3  . metFORMIN (GLUCOPHAGE) 500 MG tablet Take 500 mg by mouth 2 (two) times daily with a meal.    . NEXIUM 40 MG capsule TAKE 1 CAPSULE (40 MG TOTAL) BY MOUTH DAILY BEFORE BREAKFAST. 30 capsule 3  . nitroGLYCERIN (NITROSTAT) 0.4 MG SL tablet Place 1 tablet (0.4 mg total) under the tongue every 5 (five) minutes as needed for chest pain. 25 tablet 1   No current facility-administered medications for this visit.     Past Surgical History  Procedure Laterality Date  . Appendectomy  1967  . Knee surgery  1995    Micro  . Cardiovascular stress test  2004    Treadmill Stress Test  . Cholecystectomy  07/2006  . Cardiovascular stress test  2010  . Colonoscopy  08/2010    RMR: Cecal tubular adenoma removed, otherwise normal exam. next tcs 08/2015  . Upper gastrointestinal endoscopy  08/2010    RMR: GERD benign biopsy     Allergies  Allergen Reactions  . Other     Medication for insomnia  . Simvastatin     REACTION: arm numbness Patient currently on this medication (03/2012).      Family History  Problem Relation Age of Onset  . Lymphoma Mother   . Ulcers Father   . Ulcers Paternal Grandfather     Stomach ulcer, resection  .  Heart attack Other   . Diabetes Other      Social History Thomas [name] reports that he has never smoked. He has never used smokeless tobacco. Thomas [name] reports that he drinks about 1.2 oz of alcohol per week.   Review of Systems CONSTITUTIONAL: No weight loss, fever, chills, weakness or fatigue.  HEENT: Eyes: No visual loss, blurred vision, double vision or yellow sclerae.No hearing loss, sneezing, congestion, runny nose or sore throat.  SKIN: No rash or itching.  CARDIOVASCULAR:  RESPIRATORY: No shortness of breath, cough or sputum.  GASTROINTESTINAL: No anorexia, nausea, vomiting or diarrhea. No abdominal pain or blood.  GENITOURINARY: No burning on urination, no polyuria NEUROLOGICAL: No headache, dizziness, syncope,  paralysis, ataxia, numbness or tingling in the extremities. No change in bowel or bladder control.  MUSCULOSKELETAL: No muscle, back pain, joint pain or stiffness.  LYMPHATICS: No enlarged nodes. No history of splenectomy.  PSYCHIATRIC: No history of depression or anxiety.  ENDOCRINOLOGIC: No reports of sweating, cold or heat intolerance. No polyuria or polydipsia.  Marland Kitchen   Physical Examination There were no vitals filed for this visit. There were no vitals filed for this visit.  Gen: resting comfortably, no acute distress HEENT: no scleral icterus, pupils equal round and reactive, no palptable cervical adenopathy,  CV Resp: Clear to auscultation bilaterally GI: abdomen is soft, non-tender, non-distended, normal bowel sounds, no hepatosplenomegaly MSK: extremities are warm, no edema.  Skin: warm, no rash Neuro:  no focal deficits Psych: appropriate affect   Diagnostic Studies 01/2014 Echo Study Conclusions  - Left ventricle: The cavity size was normal. Wall thickness was increased in a pattern of mild LVH. Systolic function was normal. The estimated ejection fraction was in the range of 55% to 60%. Wall motion was normal; there were no regional wall motion abnormalities. Doppler parameters are consistent with abnormal left ventricular relaxation (grade 1 diastolic dysfunction). Doppler parameters are consistent with elevated mean left atrial filling pressure. Medial E/e&' 12.4. - Left atrium: The atrium was moderately dilated. - Right atrium: The atrium was mildly dilated. - Pulmonic valve: There was mild regurgitation.   01/2014 Lexiscan MPI IMPRESSION:  1. Abnormal study. Perfusion imaging suggests mild region of  ischemia in the mid apical inferolateral wall. The baseline GXT was  however low risk with Duke treadmill score of 8.5, and hypertensive  response.  2. No focal LV wall motion abnormalities by gated imaging.  3. Left ventricular ejection fraction 44%  although appears better  visually.  4. Overall low-risk stress test findings*.   07/2015 Exercise MPI  No diagnostic ST segment changes. Occasional PVCs noted during exercise, more frequent in recovery. Nonlimiting chest pain reported with hypertensive response. Intermediate risk Duke treadmill score of 3.5.  Small, moderate intensity, mid to apical inferolateral defect that is reversible consistent with ischemia.  This is an intermediate risk study.  Nuclear stress EF: 37%, appears higher visually. Suggest echocardiogram to further assess.   Assessment and Plan  1. Chest pain  - continued chest pain symptoms. Prior stress test i 2015 did show small area of ischemia that was overall low risk, Duke treadmill score was low risk. Progressive DOE. We will repeat exercise cardiolote.   2. HTN  - above goal. We will restart his lisinopril 40mg  daily, check BMET in 2 weeks. He will submit bp log in 2 weeks - give info on DASH diet  3. Hyperlipidemia  - continue statin  4. Sinus Bradycardia  - resolved off bystolic - continue to  monitor  5. DM II  - followed by pcp  6. DOE - progressing symptoms We will repeat exercise cardiolite to assess exercise capacity and see if any progression of ischemic territory.   7. Dizziness - symptoms suggestive of vertigo, defer to pcp.       Arnoldo Lenis, M.D.

## 2015-08-09 ENCOUNTER — Ambulatory Visit (INDEPENDENT_AMBULATORY_CARE_PROVIDER_SITE_OTHER): Payer: Medicare HMO | Admitting: Cardiology

## 2015-08-09 ENCOUNTER — Encounter: Payer: Self-pay | Admitting: Cardiology

## 2015-08-09 VITALS — BP 150/98 | HR 81 | Ht 69.0 in | Wt 210.0 lb

## 2015-08-09 DIAGNOSIS — R079 Chest pain, unspecified: Secondary | ICD-10-CM | POA: Diagnosis not present

## 2015-08-09 DIAGNOSIS — Z79899 Other long term (current) drug therapy: Secondary | ICD-10-CM | POA: Diagnosis not present

## 2015-08-09 DIAGNOSIS — I10 Essential (primary) hypertension: Secondary | ICD-10-CM | POA: Diagnosis not present

## 2015-08-09 MED ORDER — PRAVASTATIN SODIUM 20 MG PO TABS: 20.0000 mg | ORAL_TABLET | Freq: Every evening | ORAL | Status: DC

## 2015-08-09 MED ORDER — AMLODIPINE BESYLATE 5 MG PO TABS: 5.0000 mg | ORAL_TABLET | Freq: Every day | ORAL | Status: DC

## 2015-08-09 NOTE — Progress Notes (Signed)
Patient ID: Thomas [name], male   DOB: 13-Nov-1950, 65 y.o.   MRN: [id number]     Clinical Summary Thomas [name] is a 65 y.o.male seen today for follow up of the following medical problems. This is a focused visit on recent episodes of chest pain and recent abnormal stress test.   1. History of chest pain  - Echo 01/2014 LVEF 0000000, grade I diastolic dysfunction - [id number] exercise MPI mild apical inferolateral wall ischemia, low risk Duke treadmill score at 8.5, LVEF 44%. Overall low risk test  - since last visit completed exercise MPI with a Duke treadmill score of 3.5, small interoalateral ischemia, LVEF 37% though appears higher visually.  - reports continued chest pain at times. Reports yesterday he was using his push mower, after roughly 6 minutes had some chest tightness. 8/10 in severity, had some SOB. Stopped rested, pain resolved after 10 minutes.     .  Past Medical History  Diagnosis Date  . Hiatal hernia   . Hyperlipidemia   . Renal cyst     Large Left  . HTN (hypertension)   . Sleep apnea   . Chronic back pain   . Adenomatous colon polyp 08/2010     Allergies  Allergen Reactions  . Other     Medication for insomnia  . Simvastatin     REACTION: arm numbness Patient currently on this medication (03/2012).     Current Outpatient Prescriptions  Medication Sig Dispense Refill  . aspirin 81 MG tablet Take 81 mg by mouth daily.    Marland Kitchen atorvastatin (LIPITOR) 40 MG tablet TAKE 1 TABLET (40 MG TOTAL) BY MOUTH DAILY. 30 tablet 3  . hydrochlorothiazide (HYDRODIURIL) 25 MG tablet TAKE 1 TABLET (25 MG TOTAL) BY MOUTH DAILY. 90 tablet 3  . lisinopril (PRINIVIL,ZESTRIL) 40 MG tablet Take 1 tablet (40 mg total) by mouth daily. 90 tablet 3  . metFORMIN (GLUCOPHAGE) 500 MG tablet Take 500 mg by mouth 2 (two) times daily with a meal.    . NEXIUM 40 MG capsule TAKE 1 CAPSULE (40 MG TOTAL) BY MOUTH DAILY BEFORE BREAKFAST. 30 capsule 3  . nitroGLYCERIN (NITROSTAT) 0.4 MG SL tablet  Place 1 tablet (0.4 mg total) under the tongue every 5 (five) minutes as needed for chest pain. 25 tablet 1   No current facility-administered medications for this visit.     Past Surgical History  Procedure Laterality Date  . Appendectomy  1967  . Knee surgery  1995    Micro  . Cardiovascular stress test  2004    Treadmill Stress Test  . Cholecystectomy  07/2006  . Cardiovascular stress test  2010  . Colonoscopy  08/2010    RMR: Cecal tubular adenoma removed, otherwise normal exam. next tcs 08/2015  . Upper gastrointestinal endoscopy  08/2010    RMR: GERD benign biopsy     Allergies  Allergen Reactions  . Other     Medication for insomnia  . Simvastatin     REACTION: arm numbness Patient currently on this medication (03/2012).      Family History  Problem Relation Age of Onset  . Lymphoma Mother   . Ulcers Father   . Ulcers Paternal Grandfather     Stomach ulcer, resection  . Heart attack Other   . Diabetes Other      Social History Thomas [name] reports that he has never smoked. He has never used smokeless tobacco. Thomas [name] reports that he drinks about 1.2 oz of alcohol per  week.   Review of Systems CONSTITUTIONAL: No weight loss, fever, chills, weakness or fatigue.  HEENT: Eyes: No visual loss, blurred vision, double vision or yellow sclerae.No hearing loss, sneezing, congestion, runny nose or sore throat.  SKIN: No rash or itching.  CARDIOVASCULAR: per HPI RESPIRATORY: No shortness of breath, cough or sputum.  GASTROINTESTINAL: No anorexia, nausea, vomiting or diarrhea. No abdominal pain or blood.  GENITOURINARY: No burning on urination, no polyuria NEUROLOGICAL: No headache, dizziness, syncope, paralysis, ataxia, numbness or tingling in the extremities. No change in bowel or bladder control.  MUSCULOSKELETAL: No muscle, back pain, joint pain or stiffness.  LYMPHATICS: No enlarged nodes. No history of splenectomy.  PSYCHIATRIC: No history of depression or  anxiety.  ENDOCRINOLOGIC: No reports of sweating, cold or heat intolerance. No polyuria or polydipsia.  Marland Kitchen   Physical Examination Filed Vitals:   08/09/15 1410  BP: 150/98  Pulse: 81   Filed Vitals:   08/09/15 1410  Height: 5\' 9"  (1.753 m)  Weight: 210 lb (95.255 kg)    Gen: resting comfortably, no acute distress HEENT: no scleral icterus, pupils equal round and reactive, no palptable cervical adenopathy,  CV: RRR, no m/r/g, no jvd Resp: Clear to auscultation bilaterally GI: abdomen is soft, non-tender, non-distended, normal bowel sounds, no hepatosplenomegaly MSK: extremities are warm, no edema.  Skin: warm, no rash Neuro:  no focal deficits Psych: appropriate affect   Diagnostic Studies  01/2014 Echo Study Conclusions  - Left ventricle: The cavity size was normal. Wall thickness was increased in a pattern of mild LVH. Systolic function was normal. The estimated ejection fraction was in the range of 55% to 60%. Wall motion was normal; there were no regional wall motion abnormalities. Doppler parameters are consistent with abnormal left ventricular relaxation (grade 1 diastolic dysfunction). Doppler parameters are consistent with elevated mean left atrial filling pressure. Medial E/e&' 12.4. - Left atrium: The atrium was moderately dilated. - Right atrium: The atrium was mildly dilated. - Pulmonic valve: There was mild regurgitation.   01/2014 Lexiscan MPI IMPRESSION:  1. Abnormal study. Perfusion imaging suggests mild region of  ischemia in the mid apical inferolateral wall. The baseline GXT was  however low risk with Duke treadmill score of 8.5, and hypertensive  response.  2. No focal LV wall motion abnormalities by gated imaging.  3. Left ventricular ejection fraction 44% although appears better  visually.  4. Overall low-risk stress test findings*.   07/2015 Exercise MPI  No diagnostic ST segment changes. Occasional PVCs noted during exercise,  more frequent in recovery. Nonlimiting chest pain reported with hypertensive response. Intermediate risk Duke treadmill score of 3.5.  Small, moderate intensity, mid to apical inferolateral defect that is reversible consistent with ischemia.  This is an intermediate risk study.  Nuclear stress EF: 37%, appears higher visually. Suggest echocardiogram to further assess.    Assessment and Plan  1. Chest pain  - intermediate risk stress test. We discussed the options of trial of medical therapy vs invasive testing, he is not in favor of cath at this time. He is not currently on any antianginals. We will start norvasc 5mg  daily. No beta blocker due to previous beta blocker on beta blocker. He is asked to contact us if symptoms progress.  2. HTN - above goal - start norvasc 5mg  daily      Arnoldo Lenis, M.D.

## 2015-08-09 NOTE — Patient Instructions (Signed)
Your physician recommends that you schedule a follow-up appointment in: 3 weeks  STOP Lipitor  START Pravastatin 20 mg daily  START Norvasc 5 mg daily  Keep BP log     Thank you for choosing [facility] !

## 2015-08-10 LAB — BASIC METABOLIC PANEL
BUN: 13 mg/dL (ref 7–25)
CHLORIDE: 104 mmol/L (ref 98–110)
CO2: 27 mmol/L (ref 20–31)
Calcium: 8.6 mg/dL (ref 8.6–10.3)
Creat: 0.96 mg/dL (ref 0.70–1.25)
Glucose, Bld: 88 mg/dL (ref 65–99)
POTASSIUM: 3.1 mmol/L — AB (ref 3.5–5.3)
Sodium: 143 mmol/L (ref 135–146)

## 2015-08-10 LAB — TSH: TSH: 1.39 m[IU]/L (ref 0.40–4.50)

## 2015-08-13 ENCOUNTER — Other Ambulatory Visit: Payer: Self-pay

## 2015-08-13 ENCOUNTER — Telehealth: Payer: Self-pay

## 2015-08-13 MED ORDER — TRIAMTERENE-HCTZ 37.5-25 MG PO TABS: 1.0000 | ORAL_TABLET | Freq: Every day | ORAL | Status: DC

## 2015-08-13 MED ORDER — POTASSIUM CHLORIDE CRYS ER 20 MEQ PO TBCR: EXTENDED_RELEASE_TABLET | ORAL | Status: DC

## 2015-08-13 NOTE — Telephone Encounter (Signed)
-----   Message from Arnoldo Lenis, MD sent at 08/13/2015 11:03 AM EDT ----- Labs show low potassium, probably because of his HCTZ. Please have him take KCl 40 mEq x 3 days and change HCTZ to maxzide 37.5/25mg  daily.  Zandra Abts MD

## 2015-08-13 NOTE — Telephone Encounter (Signed)
lmtcb 4/14- lm

## 2015-08-13 NOTE — Telephone Encounter (Signed)
PT MADE AWARE. COPY TO PCP.

## 2015-08-30 ENCOUNTER — Ambulatory Visit (INDEPENDENT_AMBULATORY_CARE_PROVIDER_SITE_OTHER): Payer: Medicare HMO | Admitting: Adult Health

## 2015-08-30 ENCOUNTER — Encounter: Payer: Self-pay | Admitting: Adult Health

## 2015-08-30 VITALS — BP 118/68 | HR 74 | Ht 69.0 in | Wt 211.0 lb

## 2015-08-30 DIAGNOSIS — R079 Chest pain, unspecified: Secondary | ICD-10-CM | POA: Diagnosis not present

## 2015-08-30 DIAGNOSIS — I10 Essential (primary) hypertension: Secondary | ICD-10-CM

## 2015-08-30 NOTE — Progress Notes (Signed)
Cardiology Office Note   Date:  08/30/2015   ID:  Travion, Frymire 1950-07-28, MRN [id number]  PCP:  Wende Neighbors, MD  Cardiologist:  Cloria Spring, NP   Chief Complaint  Patient presents with  . Hypertension      History of Present Illness: Thomas [name] is a 65 y.o. male who presents for ongoing assessment and management of chronic chest pain, with most recent exercise stress test which was low risk, bradycardia, hypertension, dyspnea on exertion, hypertension.his [place] in the office by Dr. Harl Bowie on 08/05/2015. At that time his blood pressure was not well-controlled. He was started on lisinopril 40 mg daily with a follow-up BMET in 2 weeks. He was to take his blood pressure and record a blood pressure log and return to the office with these results.  BMET on 08/09/2015 revealed sodium 143 potassium 3.1 chloride 104 CO2 27 BUN 13 creatinine 0.96.he was subsequently started on potassium 40 mEq daily x3 days, HCTZ was discontinued and he was changed to Maxzide 37.5/25 mg daily.  The patient brings with him a copy of his blood pressures. He's had remarkable improvement in his status since being started on new medication regimen. He is however still feeling short of breath and fatigue. He states he does not have as much energy. He denies any recurrent chest pain, but does feel occasional pressure substernal.  Past Medical History  Diagnosis Date  . Hiatal hernia   . Hyperlipidemia   . Renal cyst     Large Left  . HTN (hypertension)   . Sleep apnea   . Chronic back pain   . Adenomatous colon polyp 08/2010    Past Surgical History  Procedure Laterality Date  . Appendectomy  1967  . Knee surgery  1995    Micro  . Cardiovascular stress test  2004    Treadmill Stress Test  . Cholecystectomy  07/2006  . Cardiovascular stress test  2010  . Colonoscopy  08/2010    RMR: Cecal tubular adenoma removed, otherwise normal exam. next tcs 08/2015  . Upper gastrointestinal endoscopy   08/2010    RMR: GERD benign biopsy     Current Outpatient Prescriptions  Medication Sig Dispense Refill  . amLODipine (NORVASC) 5 MG tablet Take 1 tablet (5 mg total) by mouth daily. 90 tablet 3  . aspirin 81 MG tablet Take 81 mg by mouth daily.    Marland Kitchen lisinopril (PRINIVIL,ZESTRIL) 40 MG tablet Take 1 tablet (40 mg total) by mouth daily. 90 tablet 3  . metFORMIN (GLUCOPHAGE) 500 MG tablet Take 500 mg by mouth 2 (two) times daily with a meal.    . NEXIUM 40 MG capsule TAKE 1 CAPSULE (40 MG TOTAL) BY MOUTH DAILY BEFORE BREAKFAST. 30 capsule 3  . nitroGLYCERIN (NITROSTAT) 0.4 MG SL tablet Place 1 tablet (0.4 mg total) under the tongue every 5 (five) minutes as needed for chest pain. 25 tablet 1  . potassium chloride SA (K-DUR,KLOR-CON) 20 MEQ tablet Take 40 meq. For the next three days then stop. 30 tablet 0  . pravastatin (PRAVACHOL) 20 MG tablet Take 1 tablet (20 mg total) by mouth every evening. 90 tablet 3  . triamterene-hydrochlorothiazide (MAXZIDE-25) 37.5-25 MG tablet Take 1 tablet by mouth daily. 90 tablet 3   No current facility-administered medications for this visit.    Allergies:   Other and Simvastatin    Social History:  The patient  reports that he has never smoked. He has never used smokeless tobacco.  He reports that he drinks about 1.2 oz of alcohol per week. He reports that he does not use illicit drugs.   Family History:  The patient's family history includes Diabetes in his other; Heart attack in his other; Lymphoma in his mother; Ulcers in his father and paternal grandfather.    ROS: All other systems are reviewed and negative. Unless otherwise mentioned in H&P    PHYSICAL EXAM: VS:  BP 118/68 mmHg  Pulse 74  Ht 5\' 9"  (1.753 m)  Wt 211 lb (95.709 kg)  BMI 31.15 kg/m2  SpO2 96% , BMI Body mass index is 31.15 kg/(m^2). GEN: Well nourished, well developed, in no acute distress HEENT: normal Neck: no JVD, carotid bruits, or masses Cardiac: RRR; no murmurs, rubs, or  gallops,no edema  Respiratory:  clear to auscultation bilaterally, normal work of breathing GI: soft, nontender, nondistended, + BS MS: no deformity or atrophy Skin: warm and dry, no rash Neuro:  Strength and sensation are intact Psych: euthymic mood, full affect   Recent Labs: 08/09/2015: BUN 13; Creat 0.96; Potassium 3.1*; Sodium 143; TSH 1.39    Lipid Panel    Component Value Date/Time   CHOL 193 06/20/2011 1106   TRIG 250* 06/20/2011 1106   HDL 41 06/20/2011 1106   CHOLHDL 4.7 06/20/2011 1106   VLDL 50* 06/20/2011 1106   LDLCALC 102* 06/20/2011 1106      Wt Readings from Last 3 Encounters:  08/30/15 211 lb (95.709 kg)  08/09/15 210 lb (95.255 kg)  07/23/15 214 lb (97.07 kg)     ASSESSMENT AND PLAN:  1.  Hypertension:Much improved with the addition of Maxzide, in addition to amlodipine and lisinopril.would have him to continue to take potassium supplement.  2. Chest pain:improved with better blood pressure control but he continues to feel tired and fatigues easily. We have discussed the stress test results, and he also states that he and Dr. Harl Bowie spoke at length about proceeding with cardiac catheterization due to his multiple risk factors and continued symptoms. At that time he wished to think about it. We talked about it again today in the office and I described the procedure. He was to call his insurance company to find out what his portion of the bill would be in order to have the catheterization. He should continue to think about it but may call us and stated that he is ready to proceed. In any case he was continue to follow up with Dr. Harl Bowie on a previously scheduled appointment in May 2017   Current medicines are reviewed at length with the patient today.    Labs/ tests ordered today include:  No orders of the defined types were placed in this encounter.     Disposition:   FU with Dr.Branch previously scheduled appointment.  Signed, Jory Sims, NP   08/30/2015 4:41 PM    [facility] [address], [place], [place] [address] Phone: [phone]; Fax: [phone]

## 2015-08-30 NOTE — Progress Notes (Deleted)
Name: Thomas [name]    DOB: July 03, 1950  Age: 65 y.o.  MR#: [id number]       PCP:  Wende Neighbors, MD      Insurance: Payor: [employer] MEDICARE / Plan: [employer]ET[employer] MEDICARE HMO/PPO / Product Type: *No Product type* /   CC:   No chief complaint on file.   VS Filed Vitals:   08/30/15 1550  BP: 118/68  Pulse: 74  Height: 5\' 9"  (1.753 m)  Weight: 211 lb (95.709 kg)  SpO2: 96%    Weights Current Weight  08/30/15 211 lb (95.709 kg)  08/09/15 210 lb (95.255 kg)  07/23/15 214 lb (97.07 kg)    Blood Pressure  BP Readings from Last 3 Encounters:  08/30/15 118/68  08/09/15 150/98  07/23/15 150/90     Admit date:  (Not on file) Last encounter with RMR:  Visit date not found   Allergy Other and Simvastatin  Current Outpatient Prescriptions  Medication Sig Dispense Refill  . amLODipine (NORVASC) 5 MG tablet Take 1 tablet (5 mg total) by mouth daily. 90 tablet 3  . aspirin 81 MG tablet Take 81 mg by mouth daily.    Marland Kitchen lisinopril (PRINIVIL,ZESTRIL) 40 MG tablet Take 1 tablet (40 mg total) by mouth daily. 90 tablet 3  . metFORMIN (GLUCOPHAGE) 500 MG tablet Take 500 mg by mouth 2 (two) times daily with a meal.    . NEXIUM 40 MG capsule TAKE 1 CAPSULE (40 MG TOTAL) BY MOUTH DAILY BEFORE BREAKFAST. 30 capsule 3  . nitroGLYCERIN (NITROSTAT) 0.4 MG SL tablet Place 1 tablet (0.4 mg total) under the tongue every 5 (five) minutes as needed for chest pain. 25 tablet 1  . potassium chloride SA (K-DUR,KLOR-CON) 20 MEQ tablet Take 40 meq. For the next three days then stop. 30 tablet 0  . pravastatin (PRAVACHOL) 20 MG tablet Take 1 tablet (20 mg total) by mouth every evening. 90 tablet 3  . triamterene-hydrochlorothiazide (MAXZIDE-25) 37.5-25 MG tablet Take 1 tablet by mouth daily. 90 tablet 3   No current facility-administered medications for this visit.    Discontinued Meds:   There are no discontinued medications.  Patient Active Problem List   Diagnosis Date Noted  . Adenomatous colon polyp 03/30/2011   . GERD 07/08/2010  . GASTRITIS 07/08/2010  . BACK PAIN 03/29/2010  . ANXIETY DEPRESSION 12/02/2009  . UNSPECIFIED VITAMIN D DEFICIENCY 05/13/2009  . RENAL CYST 06/24/2008  . PARESTHESIA 06/24/2008  . OTHER SYMPTOMS INVOLVING CARDIOVASCULAR SYSTEM 06/24/2008  . CHEST PAIN, ATYPICAL 06/24/2008  . RUQ PAIN 06/24/2008  . FATIGUE 01/16/2008  . SYMPTOM, PAIN, ABDOMINAL, EPIGASTRIC 08/08/2006  . HYPERCHOLESTEROLEMIA 02/06/2006  . HYPERTENSION, BENIGN SYSTEMIC 02/06/2006  . INSOMNIA NOS 02/06/2006    LABS    Component Value Date/Time   NA 143 08/09/2015 1310   NA 144 02/02/2014 1550   NA 142 03/06/2012 1634   K 3.1* 08/09/2015 1310   K 3.7 02/02/2014 1550   K 3.6 03/06/2012 1634   CL 104 08/09/2015 1310   CL 104 02/02/2014 1550   CL 105 03/06/2012 1634   CO2 27 08/09/2015 1310   CO2 32 02/02/2014 1550   CO2 32 03/06/2012 1634   GLUCOSE 88 08/09/2015 1310   GLUCOSE 89 02/02/2014 1550   GLUCOSE 71 03/06/2012 1634   BUN 13 08/09/2015 1310   BUN 9 02/02/2014 1550   BUN 13 03/06/2012 1634   CREATININE 0.96 08/09/2015 1310   CREATININE 0.97 02/02/2014 1550   CREATININE 0.82 03/06/2012 1634  CREATININE 0.91 07/09/2010 0025   CREATININE 0.84 12/03/2009 0006   CREATININE 1.01 01/16/2009 0001   CALCIUM 8.6 08/09/2015 1310   CALCIUM 9.0 02/02/2014 1550   CALCIUM 9.3 03/06/2012 1634   GFRNONAA 82 02/02/2014 1550   GFRNONAA >89 06/20/2011 1106   GFRAA >89 02/02/2014 1550   GFRAA >89 06/20/2011 1106   CMP     Component Value Date/Time   NA 143 08/09/2015 1310   K 3.1* 08/09/2015 1310   CL 104 08/09/2015 1310   CO2 27 08/09/2015 1310   GLUCOSE 88 08/09/2015 1310   BUN 13 08/09/2015 1310   CREATININE 0.96 08/09/2015 1310   CREATININE 0.91 07/09/2010 0025   CALCIUM 8.6 08/09/2015 1310   PROT 6.7 03/06/2012 1634   ALBUMIN 4.0 03/06/2012 1634   AST 24 03/06/2012 1634   ALT 31 03/06/2012 1634   ALKPHOS 63 03/06/2012 1634   BILITOT 0.4 03/06/2012 1634   GFRNONAA 82  02/02/2014 1550   GFRAA >89 02/02/2014 1550       Component Value Date/Time   WBC 9.4 03/06/2012 1634   WBC 6.7 06/24/2008 2328   WBC 6.3 01/16/2008 2312   HGB 14.3 03/06/2012 1634   HGB 15.2 06/24/2008 2328   HGB 15.5 01/16/2008 2312   HCT 42.0 03/06/2012 1634   HCT 46.4 06/24/2008 2328   HCT 46.9 01/16/2008 2312   MCV 78.7 03/06/2012 1634   MCV 81.4 06/24/2008 2328   MCV 84.2 01/16/2008 2312    Lipid Panel     Component Value Date/Time   CHOL 193 06/20/2011 1106   TRIG 250* 06/20/2011 1106   HDL 41 06/20/2011 1106   CHOLHDL 4.7 06/20/2011 1106   VLDL 50* 06/20/2011 1106   LDLCALC 102* 06/20/2011 1106    ABG No results found for: PHART, PCO2ART, PO2ART, HCO3, TCO2, ACIDBASEDEF, O2SAT   Lab Results  Component Value Date   TSH 1.39 08/09/2015   BNP (last 3 results) No results for input(s): BNP in the last 8760 hours.  ProBNP (last 3 results) No results for input(s): PROBNP in the last 8760 hours.  Cardiac Panel (last 3 results) No results for input(s): CKTOTAL, CKMB, TROPONINI, RELINDX in the last 72 hours.  Iron/TIBC/Ferritin/ %Sat    Component Value Date/Time   IRON 66 01/16/2009 0001     EKG Orders placed or performed in visit on 06/24/15  . EKG 12-Lead     Prior Assessment and Plan Problem List as of 08/30/2015      Cardiovascular and Mediastinum   HYPERTENSION, BENIGN SYSTEMIC   Last Assessment & Plan 03/06/2012 Office Visit Written 03/08/2012  9:11 AM by Mahala Menghini, PA    I request patient to call Dr. Josue Hector office to let them know about his blood pressure today and that he has not taken any blood pressure medication as they believe he is on. He stated that he would.        Digestive   GERD   Last Assessment & Plan 03/06/2012 Office Visit Written 03/08/2012  9:11 AM by Mahala Menghini, PA    Continue Nexium 40 mg daily. Typical reflux symptoms well controlled.      GASTRITIS   Adenomatous colon polyp     Genitourinary   RENAL CYST      Other   UNSPECIFIED VITAMIN D DEFICIENCY   HYPERCHOLESTEROLEMIA   ANXIETY DEPRESSION   BACK PAIN   Last Assessment & Plan 03/30/2011 Office Visit Written 03/30/2011  1:10 PM by Janene Harvey  Ronnald Ramp, NP    Follow up with her primary care provider for this      INSOMNIA NOS   FATIGUE   PARESTHESIA   OTHER SYMPTOMS INVOLVING CARDIOVASCULAR SYSTEM   CHEST PAIN, ATYPICAL   RUQ PAIN   Last Assessment & Plan 03/06/2012 Office Visit Edited 03/08/2012 10:23 AM by Mahala Menghini, PA    Intermittent right upper quadrant pain, persistent now for the past 2 weeks however. Better at this point then when it first started again. Etiology not well-defined. Workup of small bowel abnormality as outlined above. MRE abdomen was okay. Offered patient repeat CT abdomen to make sure no recurrent small bowel issues such as wall thickening, tumor. At this point the patient is not interested in further imaging. He'll let us know if symptoms do not settle down and he prefers to pursue further workup.  Labs ordered.       SYMPTOM, PAIN, ABDOMINAL, EPIGASTRIC       Imaging: Nm Myocar Multi W/spect W/wall Motion / Ef  08/02/2015   No diagnostic ST segment changes. Occasional PVCs noted during exercise, more frequent in recovery. Nonlimiting chest pain reported with hypertensive response. Intermediate risk Duke treadmill score of 3.5.  Small, moderate intensity, mid to apical inferolateral defect that is reversible consistent with ischemia.  This is an intermediate risk study.  Nuclear stress EF: 37%, appears higher visually. Suggest echocardiogram to further assess.

## 2015-08-30 NOTE — Patient Instructions (Signed)
Your physician recommends that you schedule a follow-up appointment with Dr. Harl Bowie May 16  Your physician recommends that you continue on your current medications as directed. Please refer to the Current Medication list given to you today.  If you need a refill on your cardiac medications before your next appointment, please call your pharmacy.  Thank you for choosing [facility]!

## 2015-09-14 ENCOUNTER — Ambulatory Visit: Payer: Medicare HMO | Admitting: Cardiology

## 2015-12-28 ENCOUNTER — Telehealth: Payer: Self-pay | Admitting: Adult Health

## 2015-12-28 NOTE — Telephone Encounter (Signed)
lmtcb-cc 

## 2015-12-30 NOTE — Telephone Encounter (Signed)
Pt rescheduled apt to the 11th of sept yesterday,now call to reschedule with Np or Dr branch at [facility] office if available,still has not decided on heartcatgh

## 2016-01-10 ENCOUNTER — Ambulatory Visit: Payer: Medicare HMO | Admitting: Adult Health

## 2016-01-13 ENCOUNTER — Ambulatory Visit: Payer: Medicare HMO | Admitting: Adult Health

## 2016-01-13 NOTE — Progress Notes (Signed)
This encounter was created in error - please disregard.

## 2016-01-20 ENCOUNTER — Ambulatory Visit: Payer: Medicare HMO | Admitting: Adult Health

## 2016-01-25 ENCOUNTER — Encounter: Payer: Self-pay | Admitting: Adult Health

## 2016-01-25 ENCOUNTER — Ambulatory Visit (HOSPITAL_COMMUNITY)
Admission: RE | Admit: 2016-01-25 | Discharge: 2016-01-25 | Disposition: A | Discharge status: Home / Self Care | Payer: Medicare HMO | Source: Ambulatory Visit | Attending: Adult Health | Admitting: Adult Health

## 2016-01-25 ENCOUNTER — Ambulatory Visit (INDEPENDENT_AMBULATORY_CARE_PROVIDER_SITE_OTHER): Payer: Medicare HMO | Admitting: Adult Health

## 2016-01-25 ENCOUNTER — Encounter: Payer: Self-pay | Admitting: *Deleted

## 2016-01-25 ENCOUNTER — Other Ambulatory Visit: Payer: Self-pay | Admitting: Adult Health

## 2016-01-25 VITALS — BP 124/82 | HR 71 | Ht 69.0 in | Wt 208.0 lb

## 2016-01-25 DIAGNOSIS — R079 Chest pain, unspecified: Secondary | ICD-10-CM

## 2016-01-25 DIAGNOSIS — I10 Essential (primary) hypertension: Secondary | ICD-10-CM | POA: Diagnosis not present

## 2016-01-25 DIAGNOSIS — R0789 Other chest pain: Secondary | ICD-10-CM | POA: Diagnosis not present

## 2016-01-25 DIAGNOSIS — E7800 Pure hypercholesterolemia, unspecified: Secondary | ICD-10-CM | POA: Diagnosis not present

## 2016-01-25 DIAGNOSIS — Z01818 Encounter for other preprocedural examination: Secondary | ICD-10-CM | POA: Diagnosis not present

## 2016-01-25 NOTE — Progress Notes (Signed)
Cardiology Office Note   Date:  01/25/2016   ID:  Emanuel, Stroble 1951/01/24, MRN [id number]  PCP:  Wende Neighbors, MD  Cardiologist: Cloria Spring, NP   Chief Complaint  Patient presents with  . Chest Pain  . Hypertension      History of Present Illness: [name] is a 65 y.o. male who presents for ongoing assessment and management of chronic chest pain, recent exercise stress test was low risk, he was found have some bradycardia, other history includes hypertension, dyspnea on exertion. He was last seen in the office he had been placed on lisinopril 40 mg daily by Dr. Harl Bowie and blood pressure was much better controlled on reevaluation processes 08/30/2015).  The patient had no further complaints of chest pain with blood pressure control. He continues to feel tired and fatigued easily. There was a discussion for cardiac catheterization if he had continued symptoms. At that time he wishes to think about it. A follow-up phone call on 12/30/2015 stated he continued to wishes to postpone this.  He comes today with continued exertional chest pain. He has had to take nitroglycerin. He states his walking up an incline causes him to have discomfort. He is also noticing that his blood pressure has been more difficult to control but it does "settle out" after a period of time. He is medically compliant. He is decided that he wishes to go ahead with cardiac catheterization for definitive evaluation of his coronary anatomy.   Past Medical History:  Diagnosis Date  . Adenomatous colon polyp 08/2010  . Chronic back pain   . Hiatal hernia   . HTN (hypertension)   . Hyperlipidemia   . Renal cyst    Large Left  . Sleep apnea     Past Surgical History:  Procedure Laterality Date  . APPENDECTOMY  1967  . CARDIOVASCULAR STRESS TEST  2004   Treadmill Stress Test  . CARDIOVASCULAR STRESS TEST  2010  . CHOLECYSTECTOMY  07/2006  . COLONOSCOPY  08/2010   RMR: Cecal tubular adenoma  removed, otherwise normal exam. next tcs 08/2015  . KNEE SURGERY  1995   Micro  . UPPER GASTROINTESTINAL ENDOSCOPY  08/2010   RMR: GERD benign biopsy     Current Outpatient Prescriptions  Medication Sig Dispense Refill  . amLODipine (NORVASC) 5 MG tablet Take 1 tablet (5 mg total) by mouth daily. 90 tablet 3  . aspirin 81 MG tablet Take 81 mg by mouth daily.    Marland Kitchen lisinopril (PRINIVIL,ZESTRIL) 40 MG tablet Take 1 tablet (40 mg total) by mouth daily. 90 tablet 3  . metFORMIN (GLUCOPHAGE) 500 MG tablet Take 500 mg by mouth 2 (two) times daily with a meal.    . nitroGLYCERIN (NITROSTAT) 0.4 MG SL tablet Place 1 tablet (0.4 mg total) under the tongue every 5 (five) minutes as needed for chest pain. 25 tablet 1  . potassium chloride SA (K-DUR,KLOR-CON) 20 MEQ tablet Take 40 meq. For the next three days then stop. 30 tablet 0  . pravastatin (PRAVACHOL) 20 MG tablet Take 1 tablet (20 mg total) by mouth every evening. 90 tablet 3  . triamterene-hydrochlorothiazide (MAXZIDE-25) 37.5-25 MG tablet Take 1 tablet by mouth daily. 90 tablet 3   No current facility-administered medications for this visit.     Allergies:   Other and Simvastatin    Social History:  The patient  reports that he has never smoked. He has never used smokeless tobacco. He reports that he drinks  about 1.2 oz of alcohol per week . He reports that he does not use drugs.   Family History:  The patient's family history includes Diabetes in his other; Heart attack in his other; Lymphoma in his mother; Ulcers in his father and paternal grandfather.    ROS: All other systems are reviewed and negative. Unless otherwise mentioned in H&P    PHYSICAL EXAM: VS:  BP 124/82   Pulse 71   Ht 5\' 9"  (1.753 m)   Wt 208 lb (94.3 kg)   SpO2 97%   BMI 30.72 kg/m  , BMI Body mass index is 30.72 kg/m. GEN: Well nourished, well developed, in no acute distress  HEENT: normal  Neck: no JVD, carotid bruits, or masses Cardiac:RRR; no murmurs,  rubs, or gallops,no edema  Respiratory:  clear to auscultation bilaterally, normal work of breathing GI: soft, nontender, nondistended, + BS MS: no deformity or atrophy  Skin: warm and dry, no rash Neuro:  Strength and sensation are intact Psych: euthymic mood, full affect   EKG: The ekg ordered today demonstrates    Recent Labs: 08/09/2015: BUN 13; Creat 0.96; Potassium 3.1; Sodium 143; TSH 1.39    Lipid Panel    Component Value Date/Time   CHOL 193 06/20/2011 1106   TRIG 250 (H) 06/20/2011 1106   HDL 41 06/20/2011 1106   CHOLHDL 4.7 06/20/2011 1106   VLDL 50 (H) 06/20/2011 1106   LDLCALC 102 (H) 06/20/2011 1106      Wt Readings from Last 3 Encounters:  01/25/16 208 lb (94.3 kg)  08/30/15 211 lb (95.7 kg)  08/09/15 210 lb (95.3 kg)    ASSESSMENT AND PLAN:  1. Chest pain: Progressive symptoms requiring use of nitroglycerin. Walking up small incline causes discomfort. He does have associated shortness of breath. Pain is radiating to his back. We'll plan cardiac catheterization as discussed with him by Dr. Harl Bowie on previous visits. He is shows an to proceed now that he continue stab discomfort.   Cardiac catheterization has been planned for Monday, October 2. 2017 at 7:30 AM at [facility] with Dr. Tamala Julian. I've gone over cardiac catheterization details including risks and benefits, possibility of stent placement and overnight stay if necessary. I've also gone over recovery period and possibility that he may also go home that day if no significant blockages are found requiring intervention. He verbalizes understanding. The patient will have precath labs and chest x-ray along with EKG completed today.  2. Hypertension: Blood pressure is better controlled now. He continues on lisinopril, amlodipine, and triamterene HCTZ. He states that on occasion his blood pressure rises in the 170s and 180s with associated chest pain. Use of nitroglycerin alleviates chest pain and blood pressure  normalizes. No changes in medication regimen.  3. Diabetes: He is on metformin 500 mg twice a day. He will need to hold metformin after cardiac catheterization.  4. Hypercholesterolemia:  On Pravastatin.   Current medicines are reviewed at length with the patient today.    Labs/ tests ordered today include: Pre Cath labs No orders of the defined types were placed in this encounter.    Disposition:   FU with post cath Signed, Jory Sims, NP  01/25/2016 4:36 PM    Mobile [address] [place], [place] [address] Phone: [phone]; Fax: [phone]

## 2016-01-25 NOTE — Patient Instructions (Signed)
Your physician recommends that you schedule a follow-up appointment in: After Cath   Your physician recommends that you continue on your current medications as directed. Please refer to the Current Medication list given to you today.  Your physician has requested that you have a cardiac catheterization. Cardiac catheterization is used to diagnose and/or treat various heart conditions. Doctors may recommend this procedure for a number of different reasons. The most common reason is to evaluate chest pain. Chest pain can be a symptom of coronary artery disease (CAD), and cardiac catheterization can show whether plaque is narrowing or blocking your heart's arteries. This procedure is also used to evaluate the valves, as well as measure the blood flow and oxygen levels in different parts of your heart. For further information please visit [email or web address]. Please follow instruction sheet, as given.  Your physician recommends that you return for lab work in: Today   Have Chest X-Ray done today.   If you need a refill on your cardiac medications before your next appointment, please call your pharmacy.  Thank you for choosing [facility]!

## 2016-01-25 NOTE — Progress Notes (Signed)
Name: Thomas [name]    DOB: 1950-07-19  Age: 66 y.o.  MR#: [id number]       PCP:  Wende Neighbors, MD      Insurance: Payor: [employer] MEDICARE / Plan: [employer] MEDICARE HMO/PPO / Product Type: *No Product type* /   CC:    Chief Complaint  Patient presents with  . Chest Pain  . Hypertension    VS Vitals:   01/25/16 1604  BP: 124/82  Pulse: 71  SpO2: 97%  Weight: 208 lb (94.3 kg)  Height: 5\' 9"  (1.753 m)    Weights Current Weight  01/25/16 208 lb (94.3 kg)  08/30/15 211 lb (95.7 kg)  08/09/15 210 lb (95.3 kg)    Blood Pressure  BP Readings from Last 3 Encounters:  01/25/16 124/82  08/30/15 118/68  08/09/15 (!) 150/98     Admit date:  (Not on file) Last encounter with RMR:  12/28/2015   Allergy Other and Simvastatin  Current Outpatient Prescriptions  Medication Sig Dispense Refill  . amLODipine (NORVASC) 5 MG tablet Take 1 tablet (5 mg total) by mouth daily. 90 tablet 3  . aspirin 81 MG tablet Take 81 mg by mouth daily.    Marland Kitchen lisinopril (PRINIVIL,ZESTRIL) 40 MG tablet Take 1 tablet (40 mg total) by mouth daily. 90 tablet 3  . metFORMIN (GLUCOPHAGE) 500 MG tablet Take 500 mg by mouth 2 (two) times daily with a meal.    . nitroGLYCERIN (NITROSTAT) 0.4 MG SL tablet Place 1 tablet (0.4 mg total) under the tongue every 5 (five) minutes as needed for chest pain. 25 tablet 1  . potassium chloride SA (K-DUR,KLOR-CON) 20 MEQ tablet Take 40 meq. For the next three days then stop. 30 tablet 0  . pravastatin (PRAVACHOL) 20 MG tablet Take 1 tablet (20 mg total) by mouth every evening. 90 tablet 3  . triamterene-hydrochlorothiazide (MAXZIDE-25) 37.5-25 MG tablet Take 1 tablet by mouth daily. 90 tablet 3   No current facility-administered medications for this visit.     Discontinued Meds:    Medications Discontinued During This Encounter  Medication Reason  . NEXIUM 40 MG capsule Error    Patient Active Problem List   Diagnosis Date Noted  . Adenomatous colon polyp 03/30/2011  . GERD  07/08/2010  . GASTRITIS 07/08/2010  . BACK PAIN 03/29/2010  . ANXIETY DEPRESSION 12/02/2009  . UNSPECIFIED VITAMIN D DEFICIENCY 05/13/2009  . RENAL CYST 06/24/2008  . PARESTHESIA 06/24/2008  . OTHER SYMPTOMS INVOLVING CARDIOVASCULAR SYSTEM 06/24/2008  . CHEST PAIN, ATYPICAL 06/24/2008  . RUQ PAIN 06/24/2008  . FATIGUE 01/16/2008  . SYMPTOM, PAIN, ABDOMINAL, EPIGASTRIC 08/08/2006  . HYPERCHOLESTEROLEMIA 02/06/2006  . HYPERTENSION, BENIGN SYSTEMIC 02/06/2006  . INSOMNIA NOS 02/06/2006    LABS    Component Value Date/Time   NA 143 08/09/2015 1310   NA 144 02/02/2014 1550   NA 142 03/06/2012 1634   K 3.1 (L) 08/09/2015 1310   K 3.7 02/02/2014 1550   K 3.6 03/06/2012 1634   CL 104 08/09/2015 1310   CL 104 02/02/2014 1550   CL 105 03/06/2012 1634   CO2 27 08/09/2015 1310   CO2 32 02/02/2014 1550   CO2 32 03/06/2012 1634   GLUCOSE 88 08/09/2015 1310   GLUCOSE 89 02/02/2014 1550   GLUCOSE 71 03/06/2012 1634   BUN 13 08/09/2015 1310   BUN 9 02/02/2014 1550   BUN 13 03/06/2012 1634   CREATININE 0.96 08/09/2015 1310   CREATININE 0.97 02/02/2014 1550   CREATININE 0.82  03/06/2012 1634   CALCIUM 8.6 08/09/2015 1310   CALCIUM 9.0 02/02/2014 1550   CALCIUM 9.3 03/06/2012 1634   GFRNONAA 82 02/02/2014 1550   GFRNONAA >89 06/20/2011 1106   GFRAA >89 02/02/2014 1550   GFRAA >89 06/20/2011 1106   CMP     Component Value Date/Time   NA 143 08/09/2015 1310   K 3.1 (L) 08/09/2015 1310   CL 104 08/09/2015 1310   CO2 27 08/09/2015 1310   GLUCOSE 88 08/09/2015 1310   BUN 13 08/09/2015 1310   CREATININE 0.96 08/09/2015 1310   CALCIUM 8.6 08/09/2015 1310   PROT 6.7 03/06/2012 1634   ALBUMIN 4.0 03/06/2012 1634   AST 24 03/06/2012 1634   ALT 31 03/06/2012 1634   ALKPHOS 63 03/06/2012 1634   BILITOT 0.4 03/06/2012 1634   GFRNONAA 82 02/02/2014 1550   GFRAA >89 02/02/2014 1550       Component Value Date/Time   WBC 9.4 03/06/2012 1634   WBC 6.7 06/24/2008 2328   WBC 6.3  01/16/2008 2312   HGB 14.3 03/06/2012 1634   HGB 15.2 06/24/2008 2328   HGB 15.5 01/16/2008 2312   HCT 42.0 03/06/2012 1634   HCT 46.4 06/24/2008 2328   HCT 46.9 01/16/2008 2312   MCV 78.7 03/06/2012 1634   MCV 81.4 06/24/2008 2328   MCV 84.2 01/16/2008 2312    Lipid Panel     Component Value Date/Time   CHOL 193 06/20/2011 1106   TRIG 250 (H) 06/20/2011 1106   HDL 41 06/20/2011 1106   CHOLHDL 4.7 06/20/2011 1106   VLDL 50 (H) 06/20/2011 1106   LDLCALC 102 (H) 06/20/2011 1106    ABG No results found for: PHART, PCO2ART, PO2ART, HCO3, TCO2, ACIDBASEDEF, O2SAT   Lab Results  Component Value Date   TSH 1.39 08/09/2015   BNP (last 3 results) No results for input(s): BNP in the last 8760 hours.  ProBNP (last 3 results) No results for input(s): PROBNP in the last 8760 hours.  Cardiac Panel (last 3 results) No results for input(s): CKTOTAL, CKMB, TROPONINI, RELINDX in the last 72 hours.  Iron/TIBC/Ferritin/ %Sat    Component Value Date/Time   IRON 66 01/16/2009 0001     EKG Orders placed or performed in visit on 06/24/15  . EKG 12-Lead     Prior Assessment and Plan Problem List as of 01/25/2016 Reviewed: 08/30/2015  4:53 PM by Jory Sims, NP     Cardiovascular and Mediastinum   HYPERTENSION, BENIGN SYSTEMIC   Last Assessment & Plan 03/06/2012 Office Visit Written 03/08/2012  9:11 AM by Mahala Menghini, PA    I request patient to call Dr. Josue Hector office to let them know about his blood pressure today and that he has not taken any blood pressure medication as they believe he is on. He stated that he would.        Digestive   GERD   Last Assessment & Plan 03/06/2012 Office Visit Written 03/08/2012  9:11 AM by Mahala Menghini, PA    Continue Nexium 40 mg daily. Typical reflux symptoms well controlled.      GASTRITIS   Adenomatous colon polyp     Genitourinary   RENAL CYST     Other   UNSPECIFIED VITAMIN D DEFICIENCY   HYPERCHOLESTEROLEMIA   ANXIETY  DEPRESSION   BACK PAIN   Last Assessment & Plan 03/30/2011 Office Visit Written 03/30/2011  1:10 PM by Andria Meuse, NP    Follow up with  her primary care provider for this      INSOMNIA NOS   FATIGUE   PARESTHESIA   OTHER SYMPTOMS INVOLVING CARDIOVASCULAR SYSTEM   CHEST PAIN, ATYPICAL   RUQ PAIN   Last Assessment & Plan 03/06/2012 Office Visit Edited 03/08/2012 10:23 AM by Mahala Menghini, PA    Intermittent right upper quadrant pain, persistent now for the past 2 weeks however. Better at this point then when it first started again. Etiology not well-defined. Workup of small bowel abnormality as outlined above. MRE abdomen was okay. Offered patient repeat CT abdomen to make sure no recurrent small bowel issues such as wall thickening, tumor. At this point the patient is not interested in further imaging. He'll let us know if symptoms do not settle down and he prefers to pursue further workup.  Labs ordered.       SYMPTOM, PAIN, ABDOMINAL, EPIGASTRIC       Imaging: No results found.

## 2016-01-29 DIAGNOSIS — R079 Chest pain, unspecified: Secondary | ICD-10-CM | POA: Diagnosis not present

## 2016-01-29 LAB — PROTIME-INR
INR: 1
Prothrombin Time: 10.5 s (ref 9.0–11.5)

## 2016-01-29 LAB — BASIC METABOLIC PANEL
BUN: 14 mg/dL (ref 7–25)
CO2: 28 mmol/L (ref 20–31)
CREATININE: 1.39 mg/dL — AB (ref 0.70–1.25)
Calcium: 9.2 mg/dL (ref 8.6–10.3)
Chloride: 104 mmol/L (ref 98–110)
GLUCOSE: 98 mg/dL (ref 65–99)
POTASSIUM: 4.3 mmol/L (ref 3.5–5.3)
Sodium: 141 mmol/L (ref 135–146)

## 2016-01-29 LAB — CBC WITH DIFFERENTIAL/PLATELET
BASOS PCT: 0 %
Basophils Absolute: 0 cells/uL (ref 0–200)
EOS ABS: 136 {cells}/uL (ref 15–500)
Eosinophils Relative: 2 %
HEMATOCRIT: 42.5 % (ref 38.5–50.0)
HEMOGLOBIN: 14.3 g/dL (ref 13.2–17.1)
LYMPHS ABS: 1768 {cells}/uL (ref 850–3900)
LYMPHS PCT: 26 %
MCH: 27.3 pg (ref 27.0–33.0)
MCHC: 33.6 g/dL (ref 32.0–36.0)
MCV: 81.1 fL (ref 80.0–100.0)
MONO ABS: 612 {cells}/uL (ref 200–950)
MPV: 9.9 fL (ref 7.5–12.5)
Monocytes Relative: 9 %
Neutro Abs: 4284 cells/uL (ref 1500–7800)
Neutrophils Relative %: 63 %
Platelets: 256 10*3/uL (ref 140–400)
RBC: 5.24 MIL/uL (ref 4.20–5.80)
RDW: 13.4 % (ref 11.0–15.0)
WBC: 6.8 10*3/uL (ref 3.8–10.8)

## 2016-01-31 ENCOUNTER — Encounter (HOSPITAL_COMMUNITY)
Admission: RE | Disposition: A | Discharge status: Home / Self Care | Payer: Self-pay | Source: Ambulatory Visit | Attending: Interventional Cardiology

## 2016-01-31 ENCOUNTER — Encounter (HOSPITAL_COMMUNITY): Payer: Self-pay | Admitting: Interventional Cardiology

## 2016-01-31 ENCOUNTER — Ambulatory Visit (HOSPITAL_COMMUNITY)
Admission: RE | Admit: 2016-01-31 | Discharge: 2016-01-31 | Disposition: A | Discharge status: Home / Self Care | Payer: Medicare HMO | Source: Ambulatory Visit | Attending: Interventional Cardiology | Admitting: Interventional Cardiology

## 2016-01-31 DIAGNOSIS — Z7982 Long term (current) use of aspirin: Secondary | ICD-10-CM | POA: Diagnosis not present

## 2016-01-31 DIAGNOSIS — G8929 Other chronic pain: Secondary | ICD-10-CM | POA: Diagnosis not present

## 2016-01-31 DIAGNOSIS — E785 Hyperlipidemia, unspecified: Secondary | ICD-10-CM | POA: Diagnosis not present

## 2016-01-31 DIAGNOSIS — M549 Dorsalgia, unspecified: Secondary | ICD-10-CM | POA: Diagnosis not present

## 2016-01-31 DIAGNOSIS — G4730 Sleep apnea, unspecified: Secondary | ICD-10-CM | POA: Diagnosis not present

## 2016-01-31 DIAGNOSIS — I25758 Atherosclerosis of native coronary artery of transplanted heart with other forms of angina pectoris: Secondary | ICD-10-CM | POA: Diagnosis not present

## 2016-01-31 DIAGNOSIS — E119 Type 2 diabetes mellitus without complications: Secondary | ICD-10-CM | POA: Diagnosis not present

## 2016-01-31 DIAGNOSIS — Z86010 Personal history of colonic polyps: Secondary | ICD-10-CM | POA: Diagnosis not present

## 2016-01-31 DIAGNOSIS — I10 Essential (primary) hypertension: Secondary | ICD-10-CM | POA: Diagnosis not present

## 2016-01-31 DIAGNOSIS — Z833 Family history of diabetes mellitus: Secondary | ICD-10-CM | POA: Insufficient documentation

## 2016-01-31 DIAGNOSIS — N281 Cyst of kidney, acquired: Secondary | ICD-10-CM | POA: Insufficient documentation

## 2016-01-31 DIAGNOSIS — I25118 Atherosclerotic heart disease of native coronary artery with other forms of angina pectoris: Secondary | ICD-10-CM

## 2016-01-31 DIAGNOSIS — K449 Diaphragmatic hernia without obstruction or gangrene: Secondary | ICD-10-CM | POA: Diagnosis not present

## 2016-01-31 DIAGNOSIS — Z7984 Long term (current) use of oral hypoglycemic drugs: Secondary | ICD-10-CM | POA: Diagnosis not present

## 2016-01-31 DIAGNOSIS — R0789 Other chest pain: Secondary | ICD-10-CM | POA: Diagnosis present

## 2016-01-31 DIAGNOSIS — E7800 Pure hypercholesterolemia, unspecified: Secondary | ICD-10-CM | POA: Insufficient documentation

## 2016-01-31 DIAGNOSIS — Z8249 Family history of ischemic heart disease and other diseases of the circulatory system: Secondary | ICD-10-CM | POA: Diagnosis not present

## 2016-01-31 DIAGNOSIS — F341 Dysthymic disorder: Secondary | ICD-10-CM | POA: Diagnosis present

## 2016-01-31 HISTORY — PX: CARDIAC CATHETERIZATION: SHX172

## 2016-01-31 LAB — GLUCOSE, CAPILLARY: GLUCOSE-CAPILLARY: 104 mg/dL — AB (ref 65–99)

## 2016-01-31 SURGERY — LEFT HEART CATH AND CORONARY ANGIOGRAPHY

## 2016-01-31 MED ORDER — OXYCODONE-ACETAMINOPHEN 5-325 MG PO TABS: 1.0000 | ORAL_TABLET | ORAL | Status: DC | PRN

## 2016-01-31 MED ORDER — ACETAMINOPHEN 325 MG PO TABS: 650.0000 mg | ORAL_TABLET | ORAL | Status: DC | PRN

## 2016-01-31 MED ORDER — SODIUM CHLORIDE 0.9 % WEIGHT BASED INFUSION: 3.0000 mL/kg/h | INTRAVENOUS | Status: DC

## 2016-01-31 MED ORDER — HEPARIN (PORCINE) IN NACL 2-0.9 UNIT/ML-% IJ SOLN
INTRAMUSCULAR | Status: DC | PRN
  Administered 2016-01-31: 10 mL via INTRA_ARTERIAL

## 2016-01-31 MED ORDER — MIDAZOLAM HCL 2 MG/2ML IJ SOLN
INTRAMUSCULAR | Status: AC
  Filled 2016-01-31: qty 2

## 2016-01-31 MED ORDER — IOPAMIDOL (ISOVUE-370) INJECTION 76%
INTRAVENOUS | Status: AC
  Filled 2016-01-31: qty 100

## 2016-01-31 MED ORDER — HEPARIN (PORCINE) IN NACL 2-0.9 UNIT/ML-% IJ SOLN
INTRAMUSCULAR | Status: AC
  Filled 2016-01-31: qty 1000

## 2016-01-31 MED ORDER — FENTANYL CITRATE (PF) 100 MCG/2ML IJ SOLN
INTRAMUSCULAR | Status: AC
  Filled 2016-01-31: qty 2

## 2016-01-31 MED ORDER — SODIUM CHLORIDE 0.9% FLUSH: 3.0000 mL | INTRAVENOUS | Status: DC | PRN

## 2016-01-31 MED ORDER — FENTANYL CITRATE (PF) 100 MCG/2ML IJ SOLN
INTRAMUSCULAR | Status: DC | PRN
  Administered 2016-01-31: 50 ug via INTRAVENOUS

## 2016-01-31 MED ORDER — ASPIRIN 81 MG PO CHEW
CHEWABLE_TABLET | ORAL | Status: AC
  Administered 2016-01-31: 81 mg via ORAL
  Filled 2016-01-31: qty 1

## 2016-01-31 MED ORDER — ASPIRIN 81 MG PO CHEW: 81.0000 mg | CHEWABLE_TABLET | Freq: Every day | ORAL | Status: DC

## 2016-01-31 MED ORDER — HEPARIN SODIUM (PORCINE) 1000 UNIT/ML IJ SOLN
INTRAMUSCULAR | Status: DC | PRN
Start: 2016-01-31 — End: 2016-01-31
  Administered 2016-01-31: 4500 [IU] via INTRAVENOUS

## 2016-01-31 MED ORDER — LIDOCAINE HCL (PF) 1 % IJ SOLN
INTRAMUSCULAR | Status: AC
  Filled 2016-01-31: qty 30

## 2016-01-31 MED ORDER — SODIUM CHLORIDE 0.9 % IV SOLN
INTRAVENOUS | Status: DC
  Administered 2016-01-31: 07:00:00 via INTRAVENOUS

## 2016-01-31 MED ORDER — HEPARIN (PORCINE) IN NACL 2-0.9 UNIT/ML-% IJ SOLN
INTRAMUSCULAR | Status: DC | PRN
  Administered 2016-01-31: 1000 mL

## 2016-01-31 MED ORDER — ONDANSETRON HCL 4 MG/2ML IJ SOLN: 4.0000 mg | Freq: Four times a day (QID) | INTRAMUSCULAR | Status: DC | PRN

## 2016-01-31 MED ORDER — IOPAMIDOL (ISOVUE-370) INJECTION 76%
INTRAVENOUS | Status: DC | PRN
  Administered 2016-01-31: 100 mL via INTRA_ARTERIAL

## 2016-01-31 MED ORDER — LIDOCAINE HCL (PF) 1 % IJ SOLN
INTRAMUSCULAR | Status: DC | PRN
  Administered 2016-01-31: 2 mL via INTRADERMAL

## 2016-01-31 MED ORDER — SODIUM CHLORIDE 0.9 % IV SOLN: 250.0000 mL | INTRAVENOUS | Status: DC | PRN

## 2016-01-31 MED ORDER — SODIUM CHLORIDE 0.9% FLUSH: 3.0000 mL | Freq: Two times a day (BID) | INTRAVENOUS | Status: DC

## 2016-01-31 MED ORDER — VERAPAMIL HCL 2.5 MG/ML IV SOLN
INTRAVENOUS | Status: AC
  Filled 2016-01-31: qty 2

## 2016-01-31 MED ORDER — MIDAZOLAM HCL 2 MG/2ML IJ SOLN
INTRAMUSCULAR | Status: DC | PRN
  Administered 2016-01-31 (×2): 1 mg via INTRAVENOUS

## 2016-01-31 MED ORDER — ASPIRIN 81 MG PO CHEW
81.0000 mg | CHEWABLE_TABLET | Freq: Once | ORAL | Status: AC
Start: 2016-01-31 — End: 2016-01-31
  Administered 2016-01-31: 81 mg via ORAL

## 2016-01-31 MED ORDER — HEPARIN SODIUM (PORCINE) 1000 UNIT/ML IJ SOLN
INTRAMUSCULAR | Status: AC
  Filled 2016-01-31: qty 1

## 2016-01-31 SURGICAL SUPPLY — 8 items
CATH 5FR JR4 DIAGNOSTIC (CATHETERS) ×2 IMPLANT
CATH INFINITI 5 FR JL3.5 (CATHETERS) ×2 IMPLANT
GLIDESHEATH SLEND A-KIT 6F 22G (SHEATH) ×2 IMPLANT
KIT HEART LEFT (KITS) ×2 IMPLANT
PACK CARDIAC CATHETERIZATION (CUSTOM PROCEDURE TRAY) ×2 IMPLANT
TRANSDUCER W/STOPCOCK (MISCELLANEOUS) ×2 IMPLANT
TUBING CIL FLEX 10 FLL-RA (TUBING) ×2 IMPLANT
WIRE SAFE-T 1.5MM-J .035X260CM (WIRE) ×2 IMPLANT

## 2016-01-31 NOTE — Discharge Instructions (Signed)
Radial Site Care °Refer to this sheet in the next few weeks. These instructions provide you with information about caring for yourself after your procedure. Your health care provider may also give you more specific instructions. Your treatment has been planned according to current medical practices, but problems sometimes occur. Call your health care provider if you have any problems or questions after your procedure. °WHAT TO EXPECT AFTER THE PROCEDURE °After your procedure, it is typical to have the following: °· Bruising at the radial site that usually fades within 1-2 weeks. °· Blood collecting in the tissue (hematoma) that may be painful to the touch. It should usually decrease in size and tenderness within 1-2 weeks. °HOME CARE INSTRUCTIONS °· Take medicines only as directed by your health care provider. °· You may shower 24-48 hours after the procedure or as directed by your health care provider. Remove the bandage (dressing) and gently wash the site with plain soap and water. Pat the area dry with a clean towel. Do not rub the site, because this may cause bleeding. °· Do not take baths, swim, or use a hot tub until your health care provider approves. °· Check your insertion site every day for redness, swelling, or drainage. °· Do not apply powder or lotion to the site. °· Do not flex or bend the affected arm for 24 hours or as directed by your health care provider. °· Do not push or pull heavy objects with the affected arm for 24 hours or as directed by your health care provider. °· Do not lift over 10 lb (4.5 kg) for 5 days after your procedure or as directed by your health care provider. °· Ask your health care provider when it is okay to: °¨ Return to work or school. °¨ Resume usual physical activities or sports. °¨ Resume sexual activity. °· Do not drive home if you are discharged the same day as the procedure. Have someone else drive you. °· You may drive 24 hours after the procedure unless otherwise  instructed by your health care provider. °· Do not operate machinery or power tools for 24 hours after the procedure. °· If your procedure was done as an outpatient procedure, which means that you went home the same day as your procedure, a responsible adult should be with you for the first 24 hours after you arrive home. °· Keep all follow-up visits as directed by your health care provider. This is important. °SEEK MEDICAL CARE IF: °· You have a fever. °· You have chills. °· You have increased bleeding from the radial site. Hold pressure on the site. °SEEK IMMEDIATE MEDICAL CARE IF: °· You have unusual pain at the radial site. °· You have redness, warmth, or swelling at the radial site. °· You have drainage (other than a small amount of blood on the dressing) from the radial site. °· The radial site is bleeding, and the bleeding does not stop after 30 minutes of holding steady pressure on the site. °· Your arm or hand becomes pale, cool, tingly, or numb. °  °This information is not intended to replace advice given to you by your health care provider. Make sure you discuss any questions you have with your health care provider. °  °Document Released: 05/20/2010 Document Revised: 05/08/2014 Document Reviewed: 11/03/2013 °Elsevier Interactive Patient Education ©2016 [employer] ° °

## 2016-01-31 NOTE — Interval H&P Note (Signed)
Cath Lab Visit (complete for each Cath Lab visit)  Clinical Evaluation Leading to the Procedure:   ACS: No.  Non-ACS:    Anginal Classification: CCS [name]  Anti-ischemic medical therapy: Maximal Therapy (2 or more classes of medications)  Non-Invasive Test Results: No non-invasive testing performed  Prior CABG: No previous CABG      History and Physical Interval Note:  01/31/2016 7:15 AM  Thomas [name]  has presented today for surgery, with the diagnosis of angina  The various methods of treatment have been discussed with the patient and family. After consideration of risks, benefits and other options for treatment, the patient has consented to  Procedure(s): Left Heart Cath and Coronary Angiography (N/A) as a surgical intervention .  The patient's history has been reviewed, patient examined, no change in status, stable for surgery.  I have reviewed the patient's chart and labs.  Questions were answered to the patient's satisfaction.     Thomas [name]

## 2016-01-31 NOTE — H&P (View-Only) (Signed)
Cardiology Office Note   Date:  01/25/2016   ID:  Thomas [name], Thomas [name] 01/27/1951, MRN [id number]  PCP:  Wende Neighbors, MD  Cardiologist: Cloria Spring, NP   Chief Complaint  Patient presents with  . Chest Pain  . Hypertension      History of Present Illness: Thomas [name] is a 65 y.o. male who presents for ongoing assessment and management of chronic chest pain, recent exercise stress test was low risk, he was found have some bradycardia, other history includes hypertension, dyspnea on exertion. He was last seen in the office he had been placed on lisinopril 40 mg daily by Dr. Harl Bowie and blood pressure was much better controlled on reevaluation processes 08/30/2015).  The patient had no further complaints of chest pain with blood pressure control. He continues to feel tired and fatigued easily. There was a discussion for cardiac catheterization if he had continued symptoms. At that time he wishes to think about it. A follow-up phone call on 12/30/2015 stated he continued to wishes to postpone this.  He comes today with continued exertional chest pain. He has had to take nitroglycerin. He states his walking up an incline causes him to have discomfort. He is also noticing that his blood pressure has been more difficult to control but it does "settle out" after a period of time. He is medically compliant. He is decided that he wishes to go ahead with cardiac catheterization for definitive evaluation of his coronary anatomy.   Past Medical History:  Diagnosis Date  . Adenomatous colon polyp 08/2010  . Chronic back pain   . Hiatal hernia   . HTN (hypertension)   . Hyperlipidemia   . Renal cyst    Large Left  . Sleep apnea     Past Surgical History:  Procedure Laterality Date  . APPENDECTOMY  1967  . CARDIOVASCULAR STRESS TEST  2004   Treadmill Stress Test  . CARDIOVASCULAR STRESS TEST  2010  . CHOLECYSTECTOMY  07/2006  . COLONOSCOPY  08/2010   RMR: Cecal tubular adenoma  removed, otherwise normal exam. next tcs 08/2015  . KNEE SURGERY  1995   Micro  . UPPER GASTROINTESTINAL ENDOSCOPY  08/2010   RMR: GERD benign biopsy     Current Outpatient Prescriptions  Medication Sig Dispense Refill  . amLODipine (NORVASC) 5 MG tablet Take 1 tablet (5 mg total) by mouth daily. 90 tablet 3  . aspirin 81 MG tablet Take 81 mg by mouth daily.    Marland Kitchen lisinopril (PRINIVIL,ZESTRIL) 40 MG tablet Take 1 tablet (40 mg total) by mouth daily. 90 tablet 3  . metFORMIN (GLUCOPHAGE) 500 MG tablet Take 500 mg by mouth 2 (two) times daily with a meal.    . nitroGLYCERIN (NITROSTAT) 0.4 MG SL tablet Place 1 tablet (0.4 mg total) under the tongue every 5 (five) minutes as needed for chest pain. 25 tablet 1  . potassium chloride SA (K-DUR,KLOR-CON) 20 MEQ tablet Take 40 meq. For the next three days then stop. 30 tablet 0  . pravastatin (PRAVACHOL) 20 MG tablet Take 1 tablet (20 mg total) by mouth every evening. 90 tablet 3  . triamterene-hydrochlorothiazide (MAXZIDE-25) 37.5-25 MG tablet Take 1 tablet by mouth daily. 90 tablet 3   No current facility-administered medications for this visit.     Allergies:   Other and Simvastatin    Social History:  The patient  reports that he has never smoked. He has never used smokeless tobacco. He reports that he drinks  about 1.2 oz of alcohol per week . He reports that he does not use drugs.   Family History:  The patient's family history includes Diabetes in his other; Heart attack in his other; Lymphoma in his mother; Ulcers in his father and paternal grandfather.    ROS: All other systems are reviewed and negative. Unless otherwise mentioned in H&P    PHYSICAL EXAM: VS:  BP 124/82   Pulse 71   Ht 5\' 9"  (1.753 m)   Wt 208 lb (94.3 kg)   SpO2 97%   BMI 30.72 kg/m  , BMI Body mass index is 30.72 kg/m. GEN: Well nourished, well developed, in no acute distress  HEENT: normal  Neck: no JVD, carotid bruits, or masses Cardiac:RRR; no murmurs,  rubs, or gallops,no edema  Respiratory:  clear to auscultation bilaterally, normal work of breathing GI: soft, nontender, nondistended, + BS MS: no deformity or atrophy  Skin: warm and dry, no rash Neuro:  Strength and sensation are intact Psych: euthymic mood, full affect   EKG: The ekg ordered today demonstrates    Recent Labs: 08/09/2015: BUN 13; Creat 0.96; Potassium 3.1; Sodium 143; TSH 1.39    Lipid Panel    Component Value Date/Time   CHOL 193 06/20/2011 1106   TRIG 250 (H) 06/20/2011 1106   HDL 41 06/20/2011 1106   CHOLHDL 4.7 06/20/2011 1106   VLDL 50 (H) 06/20/2011 1106   LDLCALC 102 (H) 06/20/2011 1106      Wt Readings from Last 3 Encounters:  01/25/16 208 lb (94.3 kg)  08/30/15 211 lb (95.7 kg)  08/09/15 210 lb (95.3 kg)    ASSESSMENT AND PLAN:  1. Chest pain: Progressive symptoms requiring use of nitroglycerin. Walking up small incline causes discomfort. He does have associated shortness of breath. Pain is radiating to his back. We'll plan cardiac catheterization as discussed with him by Dr. Harl Bowie on previous visits. He is shows an to proceed now that he continue stab discomfort.   Cardiac catheterization has been planned for Monday, October 2. 2017 at 7:30 AM at [facility] with Dr. Tamala Julian. I've gone over cardiac catheterization details including risks and benefits, possibility of stent placement and overnight stay if necessary. I've also gone over recovery period and possibility that he may also go home that day if no significant blockages are found requiring intervention. He verbalizes understanding. The patient will have precath labs and chest x-ray along with EKG completed today.  2. Hypertension: Blood pressure is better controlled now. He continues on lisinopril, amlodipine, and triamterene HCTZ. He states that on occasion his blood pressure rises in the 170s and 180s with associated chest pain. Use of nitroglycerin alleviates chest pain and blood pressure  normalizes. No changes in medication regimen.  3. Diabetes: He is on metformin 500 mg twice a day. He will need to hold metformin after cardiac catheterization.  4. Hypercholesterolemia:  On Pravastatin.   Current medicines are reviewed at length with the patient today.    Labs/ tests ordered today include: Pre Cath labs No orders of the defined types were placed in this encounter.    Disposition:   FU with post cath Signed, Jory Sims, NP  01/25/2016 4:36 PM    [facility] [address] [place], [place] [address] Phone: [phone]; Fax: [phone]

## 2016-02-02 ENCOUNTER — Encounter: Payer: Self-pay | Admitting: Surgery

## 2016-02-02 ENCOUNTER — Institutional Professional Consult (permissible substitution) (INDEPENDENT_AMBULATORY_CARE_PROVIDER_SITE_OTHER): Payer: Medicare HMO | Admitting: Surgery

## 2016-02-02 VITALS — BP 151/91 | HR 69 | Resp 16 | Ht 69.0 in | Wt 208.0 lb

## 2016-02-02 DIAGNOSIS — I2510 Atherosclerotic heart disease of native coronary artery without angina pectoris: Secondary | ICD-10-CM

## 2016-02-02 DIAGNOSIS — R079 Chest pain, unspecified: Secondary | ICD-10-CM | POA: Diagnosis not present

## 2016-02-07 ENCOUNTER — Ambulatory Visit: Payer: Medicare HMO | Admitting: Adult Health

## 2016-02-07 ENCOUNTER — Encounter: Payer: Self-pay | Admitting: Surgery

## 2016-02-07 NOTE — Progress Notes (Signed)
Cardiothoracic Surgery Consultation  PCP is Wende Neighbors, MD Referring Provider is Belva Crome, MD  Chief Complaint  Patient presents with  . Coronary Artery Disease    CATH 01/31/16    HPI:  The patient is a 65 year old gentleman with a history of hypertension and hyperlipidemia who has been followed by Dr. Harl Bowie for chronic exertional chest pain for the past couple years. He has a low risk nuclear stress test in 01/2014 and an echo at that time showed normal LV function with an EF of 55-60%. He continued to have symptoms and underwent a nuclear stress test in 07/2015 showing a small, moderate intensity, reversible defect in the mid to apical inferolateral myocardium. The EF ws 37%. He was offered cath at that time but decided to continue medical therapy. He has continued to have exertional chest pain when walking up an incline or mowing his grass. He has been using NTG. He decided to proceed with cath on 01/31/2016 which showed an eccentric 70-80% ostial/proximal LCX lesion in a large vessel. The LM had 50% distal tapering stenosis. LV function was normal with an LVEDP of 21.  Past Medical History:  Diagnosis Date  . Adenomatous colon polyp 08/2010  . Chronic back pain   . Hiatal hernia   . HTN (hypertension)   . Hyperlipidemia   . Renal cyst    Large Left  . Sleep apnea     Past Surgical History:  Procedure Laterality Date  . APPENDECTOMY  1967  . CARDIAC CATHETERIZATION N/A 01/31/2016   Procedure: Left Heart Cath and Coronary Angiography;  Surgeon: Belva Crome, MD;  Location: [facility];  Service: Cardiovascular;  Laterality: N/A;  . CARDIOVASCULAR STRESS TEST  2004   Treadmill Stress Test  . CARDIOVASCULAR STRESS TEST  2010  . CHOLECYSTECTOMY  07/2006  . COLONOSCOPY  08/2010   RMR: Cecal tubular adenoma removed, otherwise normal exam. next tcs 08/2015  . KNEE SURGERY  1995   Micro  . UPPER GASTROINTESTINAL ENDOSCOPY  08/2010   RMR: GERD benign biopsy    Family  History  Problem Relation Age of Onset  . Lymphoma Mother   . Ulcers Father   . Ulcers Paternal Grandfather     Stomach ulcer, resection  . Heart attack Other   . Diabetes Other     Social History Social History  Substance Use Topics  . Smoking status: Never Smoker  . Smokeless tobacco: Never Used  . Alcohol use 1.2 oz/week    2 Standard drinks or equivalent per week    Current Outpatient Prescriptions  Medication Sig Dispense Refill  . amLODipine (NORVASC) 5 MG tablet Take 1 tablet (5 mg total) by mouth daily. 90 tablet 3  . aspirin 81 MG tablet Take 81 mg by mouth daily.    Marland Kitchen esomeprazole (NEXIUM) 20 MG capsule Take 20 mg by mouth daily as needed (heartburn).    Marland Kitchen lisinopril (PRINIVIL,ZESTRIL) 40 MG tablet Take 1 tablet (40 mg total) by mouth daily. 90 tablet 3  . metFORMIN (GLUCOPHAGE) 500 MG tablet Take 500 mg by mouth 2 (two) times daily with a meal.    . nitroGLYCERIN (NITROSTAT) 0.4 MG SL tablet Place 1 tablet (0.4 mg total) under the tongue every 5 (five) minutes as needed for chest pain. 25 tablet 1  . pravastatin (PRAVACHOL) 20 MG tablet Take 1 tablet (20 mg total) by mouth every evening. 90 tablet 3  . triamterene-hydrochlorothiazide (MAXZIDE-25) 37.5-25 MG tablet Take  1 tablet by mouth daily. 90 tablet 3   No current facility-administered medications for this visit.     Allergies  Allergen Reactions  . Other     Medication for insomnia  . Simvastatin     REACTION: arm numbness Patient currently on this medication (03/2012).    Review of Systems  Constitutional: Positive for activity change. Negative for fatigue.  HENT: Negative.   Eyes: Negative.   Respiratory: Positive for chest tightness and shortness of breath.   Cardiovascular: Positive for chest pain. Negative for palpitations and leg swelling.  Gastrointestinal: Negative.   Endocrine: Negative.   Genitourinary: Negative.   Musculoskeletal: Negative.   Allergic/Immunologic: Negative.     Neurological: Negative.   Hematological: Negative.   Psychiatric/Behavioral: Negative.     BP (!) 151/91   Pulse 69   Resp 16   Ht 5\' 9"  (1.753 m)   Wt 208 lb (94.3 kg)   SpO2 98% Comment: ON RA  BMI 30.72 kg/m  Physical Exam  Constitutional: He is oriented to person, place, and time. He appears well-developed and well-nourished. No distress.  HENT:  Head: Normocephalic and atraumatic.  Eyes: EOM are normal. Pupils are equal, round, and reactive to light.  Neck: Normal range of motion. Neck supple. No JVD present. No thyromegaly present.  Cardiovascular: Normal rate, regular rhythm, normal heart sounds and intact distal pulses.   No murmur heard. Pulmonary/Chest: Effort normal and breath sounds normal. No respiratory distress.  Abdominal: Soft. Bowel sounds are normal. He exhibits no distension and no mass. There is no tenderness.  Musculoskeletal: Normal range of motion. He exhibits no edema.  Lymphadenopathy:    He has no cervical adenopathy.  Neurological: He is alert and oriented to person, place, and time. He has normal strength. No cranial nerve deficit or sensory deficit.  Skin: Skin is warm and dry.  Psychiatric: He has a normal mood and affect.    Diagnostic Tests:  Physicians   Panel Physicians Referring Physician Case Authorizing Physician  Belva Crome, MD (Primary)    Procedures   Left Heart Cath and Coronary Angiography  Conclusion     Ost Cx lesion, 75 %stenosed.  LM lesion, 50 %stenosed.  The left ventricular systolic function is normal.  The left ventricular ejection fraction is 50-55% by visual estimate.  LV end diastolic pressure is mildly elevated.    Eccentric 70-80% proximal/ostial circumflex. Circumflex territory is large.  50% stenosis in the distal left main.  Codominant right coronary  Overall normal LV function with mild elevation and end-diastolic pressure.  RECOMMENDATIONS:   Optimize medical therapy, although the  patient is unable to use statin therapy due to side effects. Will add low-dose beta blocker therapy. Beta-blockade dosing will be compromised by relatively slow resting heart rate.  Consider CABG versus higher risk PCI of the ostial circumflex in the setting of distal left main disease..    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 Intravenous sedation with Versed and fentanyl was administered. 1% Xylocaine was infiltrated to achieve local analgesia. A double wall stick with an angiocath was utilized to obtain intra-arterial access. The modified Seldinger technique was used to place a 28F " Slender" sheath in the right radial artery. Weight based heparin was administered. Coronary angiography was done using 5  F catheters. Right coronary angiography was performed with a JR4. Left ventricular hemodymic recordings and angiography was done using the JR 4 catheter and hand injection. Left coronary angiography was performed with a JL 3.5 cm.  Hemostasis was achieved using a pneumatic band.  During this procedure the patient is administered a total of Versed 2 mg and Fentanyl 50 mg to achieve and maintain moderate conscious sedation. The patient's heart rate, blood pressure, and oxygen saturation are monitored continuously during the procedure. The period of conscious sedation is 26 minutes, of which I was present face-to-face 100% of this time.   Estimated blood loss <50 mL.  During this procedure the patient was administered the following to achieve and maintain moderate conscious sedation: Versed 2 mg, Fentanyl 50 mcg, while the patient's heart rate, blood pressure, and oxygen saturation were continuously monitored. The period of conscious sedation was 26  minutes, of which I was present face-to-face 100% of this time.    Coronary Findings   Dominance: Co-dominant  Left Main  LM lesion, 50% stenosed.  Left Circumflex  Ost Cx lesion, 75% stenosed. The lesion is eccentric.  Right Coronary Artery  Right Posterior Atrioventricular Branch  Vessel is small in size.  Wall Motion   Resting               Left Heart   Left Ventricle The left ventricular size is normal. The left ventricular systolic function is normal. LV end diastolic pressure is mildly elevated. The left ventricular ejection fraction is 50-55% by visual estimate. No regional wall motion abnormalities.    Coronary Diagrams   Diagnostic Diagram     Implants     No implant documentation for this case.  PACS Images   Show images for Cardiac catheterization   Link to Procedure Log   Procedure Log    Hemo Data   Flowsheet Row Most Recent Value  AO Systolic Pressure 98 mmHg  AO Diastolic Pressure 60 mmHg  AO Mean 75 mmHg  LV Systolic Pressure [id number]  LV Diastolic Pressure 11 mmHg  LV EDP 21 mmHg  Arterial Occlusion Pressure Extended Systolic Pressure 123XX123 mmHg  Arterial Occlusion Pressure Extended Diastolic Pressure 66 mmHg  Arterial Occlusion Pressure Extended Mean Pressure 84 mmHg  Left Ventricular Apex Extended Systolic Pressure 123456 mmHg  Left Ventricular Apex Extended Diastolic Pressure 10 mmHg  Left Ventricular Apex Extended EDP Pressure 16 mmHg    Impression:  This 65 year old gentleman presents with stable exertional angina occurring with low level activity. His nuclear stress test showed a reversible defect in the region supplied by the LCX. I have personally reviewed and interpreted his cath films and this proximal LCX lesion compromises a large territory and is close enough to the stenosed distal left main that a PCI would be risky. His symptoms are severely limiting his lifestyle and I think CABG is the best treatment for him.  I discussed the  operative procedure with the patient and his wife including alternatives, benefits and risks; including but not limited to bleeding, blood transfusion, infection, stroke, myocardial infarction, graft failure, heart block requiring a permanent pacemaker, organ dysfunction, and death.  I answered all of their questions. Cristopher Estimable Jonesunderstands and agrees to proceed.  Plan:  He is going to think about the timing of surgery and will call our office to schedule CABG in the near future.   I spent 60 minutes performing this consultation and > 50% of this time was spent face to face counseling and coordinating the care of this patient's left main and LCX coronary disease.  Gaye Pollack, MD [facility] [phone]

## 2016-02-14 ENCOUNTER — Other Ambulatory Visit: Payer: Self-pay | Admitting: *Deleted

## 2016-02-14 DIAGNOSIS — I2510 Atherosclerotic heart disease of native coronary artery without angina pectoris: Secondary | ICD-10-CM

## 2016-02-24 ENCOUNTER — Ambulatory Visit (HOSPITAL_COMMUNITY)
Admission: RE | Admit: 2016-02-24 | Discharge: 2016-02-24 | Disposition: A | Discharge status: Home / Self Care | Payer: Medicare HMO | Source: Ambulatory Visit | Attending: Surgery | Admitting: Surgery

## 2016-02-24 ENCOUNTER — Encounter (HOSPITAL_COMMUNITY): Payer: Medicare HMO

## 2016-02-24 ENCOUNTER — Encounter (HOSPITAL_COMMUNITY)
Admission: RE | Admit: 2016-02-24 | Discharge: 2016-02-24 | Disposition: A | Discharge status: Home / Self Care | Payer: Medicare HMO | Source: Ambulatory Visit | Attending: Surgery | Admitting: Surgery

## 2016-02-24 ENCOUNTER — Encounter (HOSPITAL_COMMUNITY): Payer: Self-pay

## 2016-02-24 ENCOUNTER — Other Ambulatory Visit (HOSPITAL_COMMUNITY): Payer: Medicare HMO

## 2016-02-24 DIAGNOSIS — Z0183 Encounter for blood typing: Secondary | ICD-10-CM | POA: Insufficient documentation

## 2016-02-24 DIAGNOSIS — Z01818 Encounter for other preprocedural examination: Secondary | ICD-10-CM | POA: Diagnosis not present

## 2016-02-24 DIAGNOSIS — I440 Atrioventricular block, first degree: Secondary | ICD-10-CM | POA: Diagnosis not present

## 2016-02-24 DIAGNOSIS — R9431 Abnormal electrocardiogram [ECG] [EKG]: Secondary | ICD-10-CM | POA: Diagnosis not present

## 2016-02-24 DIAGNOSIS — I6523 Occlusion and stenosis of bilateral carotid arteries: Secondary | ICD-10-CM | POA: Diagnosis not present

## 2016-02-24 DIAGNOSIS — I517 Cardiomegaly: Secondary | ICD-10-CM | POA: Diagnosis not present

## 2016-02-24 DIAGNOSIS — Z01812 Encounter for preprocedural laboratory examination: Secondary | ICD-10-CM | POA: Diagnosis not present

## 2016-02-24 DIAGNOSIS — I2510 Atherosclerotic heart disease of native coronary artery without angina pectoris: Secondary | ICD-10-CM | POA: Diagnosis not present

## 2016-02-24 DIAGNOSIS — R001 Bradycardia, unspecified: Secondary | ICD-10-CM | POA: Diagnosis not present

## 2016-02-24 HISTORY — DX: Gastro-esophageal reflux disease without esophagitis: K21.9

## 2016-02-24 HISTORY — DX: Restless legs syndrome: G25.81

## 2016-02-24 HISTORY — DX: Type 2 diabetes mellitus without complications: E11.9

## 2016-02-24 HISTORY — DX: Atherosclerotic heart disease of native coronary artery without angina pectoris: I25.10

## 2016-02-24 LAB — CBC
HEMATOCRIT: 42 % (ref 39.0–52.0)
Hemoglobin: 13.6 g/dL (ref 13.0–17.0)
MCH: 26.8 pg (ref 26.0–34.0)
MCHC: 32.4 g/dL (ref 30.0–36.0)
MCV: 82.8 fL (ref 78.0–100.0)
Platelets: 229 10*3/uL (ref 150–400)
RBC: 5.07 MIL/uL (ref 4.22–5.81)
RDW: 14.2 % (ref 11.5–15.5)
WBC: 5.7 10*3/uL (ref 4.0–10.5)

## 2016-02-24 LAB — URINALYSIS, ROUTINE W REFLEX MICROSCOPIC
BILIRUBIN URINE: NEGATIVE
Glucose, UA: 100 mg/dL — AB
HGB URINE DIPSTICK: NEGATIVE
Ketones, ur: NEGATIVE mg/dL
Leukocytes, UA: NEGATIVE
NITRITE: NEGATIVE
PH: 7 (ref 5.0–8.0)
Protein, ur: NEGATIVE mg/dL
SPECIFIC GRAVITY, URINE: 1.016 (ref 1.005–1.030)

## 2016-02-24 LAB — COMPREHENSIVE METABOLIC PANEL
ALBUMIN: 3.9 g/dL (ref 3.5–5.0)
ALT: 37 U/L (ref 17–63)
AST: 36 U/L (ref 15–41)
Alkaline Phosphatase: 45 U/L (ref 38–126)
Anion gap: 5 (ref 5–15)
BUN: 10 mg/dL (ref 6–20)
CHLORIDE: 108 mmol/L (ref 101–111)
CO2: 24 mmol/L (ref 22–32)
CREATININE: 0.85 mg/dL (ref 0.61–1.24)
Calcium: 8.3 mg/dL — ABNORMAL LOW (ref 8.9–10.3)
GFR calc Af Amer: >60 mL/min (ref >60)
GFR calc non Af Amer: >60 mL/min (ref >60)
Glucose, Bld: 156 mg/dL — ABNORMAL HIGH (ref 65–99)
POTASSIUM: 3.3 mmol/L — AB (ref 3.5–5.1)
SODIUM: 137 mmol/L (ref 135–145)
Total Bilirubin: 0.7 mg/dL (ref 0.3–1.2)
Total Protein: 6.5 g/dL (ref 6.5–8.1)

## 2016-02-24 LAB — VAS US DOPPLER PRE CABG
LCCADSYS: -85 cm/s
LCCAPDIAS: 24 cm/s
LEFT ECA DIAS: -14 cm/s
LEFT VERTEBRAL DIAS: 18 cm/s
Left CCA dist dias: -21 cm/s
Left CCA prox sys: 107 cm/s
Left ICA dist dias: -17 cm/s
Left ICA dist sys: -43 cm/s
Left ICA prox dias: 24 cm/s
Left ICA prox sys: 62 cm/s
RCCADSYS: -66 cm/s
RCCAPDIAS: -16 cm/s
RCCAPSYS: -72 cm/s
RIGHT ECA DIAS: -12 cm/s
RIGHT VERTEBRAL DIAS: 13 cm/s

## 2016-02-24 LAB — APTT: APTT: 32 s (ref 24–36)

## 2016-02-24 LAB — PROTIME-INR
INR: 1
Prothrombin Time: 13.2 seconds (ref 11.4–15.2)

## 2016-02-24 LAB — TYPE AND SCREEN
ABO/RH(D): O NEG
Antibody Screen: NEGATIVE

## 2016-02-24 LAB — GLUCOSE, CAPILLARY: GLUCOSE-CAPILLARY: 179 mg/dL — AB (ref 65–99)

## 2016-02-24 LAB — SURGICAL PCR SCREEN
MRSA, PCR: NEGATIVE
Staphylococcus aureus: NEGATIVE

## 2016-02-24 LAB — ABO/RH: ABO/RH(D): O NEG

## 2016-02-24 NOTE — Progress Notes (Signed)
Pt states he still has chest pressure off and on. Pt states he takes NTG and it usually eases up. Pt states he's been pressure washing his house, cleaning off his roof and other strenous activities. He states he will get this pressure even at rest. Instructed pt if chest pressure continues after taking 3 NTG tablets he should call 911. Pt is diabetic, supposed to take Metformin BID, but he states he takes whenever he thinks he needs it. I instructed pt to take Metformin as prescribed prior to surgery (except for day of surgery). States fasting blood sugar is usually between 88-104.

## 2016-02-24 NOTE — Progress Notes (Signed)
Pre-op Cardiac Surgery  Carotid Findings:  Bilateral: No significant (1-39%) ICA stenosis. Antegrade vertebral flow.    Upper Extremity Right Left  Brachial Pressures 142 136  Radial Waveforms Tri Tri  Ulnar Waveforms Tri Tri  Palmar Arch (Allen's Test) Normal  Normal    Findings:  Pedal artery waveforms within normal limits.   Landry Mellow, RDMS, RVT 02/24/2016

## 2016-02-24 NOTE — Pre-Procedure Instructions (Signed)
VAUGHAN MOWAT  02/24/2016    Your procedure is scheduled on Monday, February 28, 2016 at 7:30 AM.   Report to [facility] Entrance "A" Admitting Office at 5:30 AM.   Call this number if you have problems the morning of surgery: [phone]   Questions prior to day of surgery, please call [phone] between 8 & 4 PM.   Remember:  Do not eat food or drink liquids after midnight Sunday, 02/27/16.  Take these medicines the morning of surgery with A SIP OF WATER: Esomeprazole (Nexium) - if needed, Nitroglycerin - if needed, Nasonex - if needed.   How to Manage Your Diabetes Before Surgery   Why is it important to control my blood sugar before and after surgery?   Improving blood sugar levels before and after surgery helps healing and can limit problems.  A way of improving blood sugar control is eating a healthy diet by:  - Eating less sugar and carbohydrates  - Increasing activity/exercise  - Talk with your doctor about reaching your blood sugar goals  High blood sugars (greater than 180 mg/dL) can raise your risk of infections and slow down your recovery so you will need to focus on controlling your diabetes during the weeks before surgery.  Make sure that the doctor who takes care of your diabetes knows about your planned surgery including the date and location.  How do I manage my blood sugars before surgery?   Check your blood sugar at least 4 times a day, 2 days before surgery to make sure that they are not too high or low.  Check your blood sugar the morning of your surgery when you wake up and every 2 hours until you get to the Short-Stay unit.  Treat a low blood sugar (less than 70 mg/dL) with 1/2 cup of clear juice (cranberry or apple), 4 glucose tablets, OR glucose gel.  Recheck blood sugar in 15 minutes after treatment (to make sure it is greater than 70 mg/dL).  If blood sugar is not greater than 70 mg/dL on re-check, call [phone] for further  instructions.   Report your blood sugar to the Short-Stay nurse when you get to Short-Stay.  References:  [facility], 2007 "How to Manage your Diabetes Before and After Surgery".  What do I do about my diabetes medications?   Do not take oral diabetes medicines (pills) the morning of surgery.   Do not wear jewelry.  Do not wear lotions, powders, cologne, or deodorant.  Men may shave face and neck.  Do not bring valuables to the hospital.  [facility] is not responsible for any belongings or valuables.  Contacts, dentures or bridgework may not be worn into surgery.  Leave your suitcase in the car.  After surgery it may be brought to your room.  For patients admitted to the hospital, discharge time will be determined by your treatment team.  Special instructions:  [facility] - Preparing for Surgery  Before surgery, you can play an important role.  Because skin is not sterile, your skin needs to be as free of germs as possible.  You can reduce the number of germs on you skin by washing with CHG (chlorahexidine gluconate) soap before surgery.  CHG is an antiseptic cleaner which kills germs and bonds with the skin to continue killing germs even after washing.  Please DO NOT use if you have an allergy to CHG or antibacterial soaps.  If your skin becomes  reddened/irritated stop using the CHG and inform your nurse when you arrive at Short Stay.  Do not shave (including legs and underarms) for at least 48 hours prior to the first CHG shower.  You may shave your face.  Please follow these instructions carefully:   1.  Shower with CHG Soap the night before surgery and the                    morning of Surgery.  2.  If you choose to wash your hair, wash your hair first as usual with your       normal shampoo.  3.  After you shampoo, rinse your hair and body thoroughly to remove the shampoo.  4.  Use CHG as you would any other liquid soap.  You can apply chg  directly       to the skin and wash gently with scrungie or a clean washcloth.  5.  Apply the CHG Soap to your body ONLY FROM THE NECK DOWN.        Do not use on open wounds or open sores.  Avoid contact with your eyes, ears, mouth and genitals (private parts).  Wash genitals (private parts) with your normal soap.  6.  Wash thoroughly, paying special attention to the area where your surgery        will be performed.  7.  Thoroughly rinse your body with warm water from the neck down.  8.  DO NOT shower/wash with your normal soap after using and rinsing off       the CHG Soap.  9.  Pat yourself dry with a clean towel.            10.  Wear clean pajamas.            11.  Place clean sheets on your bed the night of your first shower and do not        sleep with pets.  Day of Surgery  Do not apply any lotions/deodorants the morning of surgery.  Please wear clean clothes to the hospital.  Please read over the following fact sheets that you were given.

## 2016-02-25 ENCOUNTER — Ambulatory Visit (HOSPITAL_COMMUNITY)
Admission: RE | Admit: 2016-02-25 | Discharge: 2016-02-25 | Disposition: A | Discharge status: Home / Self Care | Payer: Medicare HMO | Source: Ambulatory Visit | Attending: Surgery | Admitting: Surgery

## 2016-02-25 DIAGNOSIS — I25118 Atherosclerotic heart disease of native coronary artery with other forms of angina pectoris: Secondary | ICD-10-CM | POA: Diagnosis not present

## 2016-02-25 DIAGNOSIS — G8929 Other chronic pain: Secondary | ICD-10-CM | POA: Diagnosis not present

## 2016-02-25 DIAGNOSIS — E785 Hyperlipidemia, unspecified: Secondary | ICD-10-CM | POA: Diagnosis not present

## 2016-02-25 DIAGNOSIS — I2510 Atherosclerotic heart disease of native coronary artery without angina pectoris: Secondary | ICD-10-CM

## 2016-02-25 DIAGNOSIS — E119 Type 2 diabetes mellitus without complications: Secondary | ICD-10-CM | POA: Diagnosis not present

## 2016-02-25 DIAGNOSIS — M549 Dorsalgia, unspecified: Secondary | ICD-10-CM | POA: Diagnosis not present

## 2016-02-25 DIAGNOSIS — Z23 Encounter for immunization: Secondary | ICD-10-CM | POA: Diagnosis not present

## 2016-02-25 DIAGNOSIS — I10 Essential (primary) hypertension: Secondary | ICD-10-CM | POA: Diagnosis not present

## 2016-02-25 DIAGNOSIS — G4730 Sleep apnea, unspecified: Secondary | ICD-10-CM | POA: Diagnosis not present

## 2016-02-25 DIAGNOSIS — D649 Anemia, unspecified: Secondary | ICD-10-CM | POA: Diagnosis not present

## 2016-02-25 DIAGNOSIS — E8770 Fluid overload, unspecified: Secondary | ICD-10-CM | POA: Diagnosis not present

## 2016-02-25 LAB — BLOOD GAS, ARTERIAL
Acid-Base Excess: 1.6 mmol/L (ref 0.0–2.0)
BICARBONATE: 25.6 mmol/L (ref 20.0–28.0)
Drawn by: 242311
FIO2: 21
O2 SAT: 96 %
PATIENT TEMPERATURE: 98.6
PO2 ART: 78.7 mmHg — AB (ref 83.0–108.0)
pCO2 arterial: 39.3 mmHg (ref 32.0–48.0)
pH, Arterial: 7.429 (ref 7.350–7.450)

## 2016-02-25 LAB — PULMONARY FUNCTION TEST
DL/VA % PRED: 115 %
DL/VA: 5.26 ml/min/mmHg/L
DLCO UNC: 28.88 ml/min/mmHg
DLCO unc % pred: 93 %
FEF 25-75 POST: 3.88 L/s
FEF 25-75 Pre: 2.77 L/sec
FEF2575-%Change-Post: 40 %
FEF2575-%PRED-POST: 152 %
FEF2575-%Pred-Pre: 108 %
FEV1-%CHANGE-POST: 7 %
FEV1-%PRED-POST: 88 %
FEV1-%Pred-Pre: 82 %
FEV1-POST: 2.86 L
FEV1-Pre: 2.66 L
FEV1FVC-%CHANGE-POST: 3 %
FEV1FVC-%PRED-PRE: 109 %
FEV6-%Change-Post: 3 %
FEV6-%Pred-Post: 81 %
FEV6-%Pred-Pre: 79 %
FEV6-Post: 3.38 L
FEV6-Pre: 3.28 L
FEV6FVC-%Change-Post: 0 %
FEV6FVC-%Pred-Post: 104 %
FEV6FVC-%Pred-Pre: 105 %
FVC-%CHANGE-POST: 3 %
FVC-%PRED-PRE: 75 %
FVC-%Pred-Post: 77 %
FVC-POST: 3.4 L
FVC-PRE: 3.28 L
POST FEV1/FVC RATIO: 84 %
PRE FEV6/FVC RATIO: 100 %
Post FEV6/FVC ratio: 100 %
Pre FEV1/FVC ratio: 81 %
RV % pred: 181 %
RV: 4.23 L
TLC % pred: 133 %
TLC: 9.1 L

## 2016-02-25 LAB — HEMOGLOBIN A1C
HEMOGLOBIN A1C: 6.4 % — AB (ref 4.8–5.6)
Mean Plasma Glucose: 137 mg/dL

## 2016-02-25 MED ORDER — ALBUTEROL SULFATE (2.5 MG/3ML) 0.083% IN NEBU
2.5000 mg | INHALATION_SOLUTION | Freq: Once | RESPIRATORY_TRACT | Status: AC
  Administered 2016-02-25: 2.5 mg via RESPIRATORY_TRACT

## 2016-02-27 MED ORDER — VANCOMYCIN HCL 10 G IV SOLR
1500.0000 mg | INTRAVENOUS | Status: AC
  Administered 2016-02-28: 1500 mg via INTRAVENOUS
  Filled 2016-02-27: qty 1500

## 2016-02-27 MED ORDER — EPINEPHRINE PF 1 MG/ML IJ SOLN
0.0000 ug/min | INTRAVENOUS | Status: DC
  Filled 2016-02-27: qty 4

## 2016-02-27 MED ORDER — CEFUROXIME SODIUM 750 MG IJ SOLR
750.0000 mg | INTRAMUSCULAR | Status: AC
  Administered 2016-02-28: 1.5 g via INTRAVENOUS
  Administered 2016-02-28: 750 g via INTRAVENOUS
  Filled 2016-02-27: qty 750

## 2016-02-27 MED ORDER — DEXTROSE 5 % IV SOLN
30.0000 ug/min | INTRAVENOUS | Status: DC
  Filled 2016-02-27: qty 2

## 2016-02-27 MED ORDER — MAGNESIUM SULFATE 50 % IJ SOLN
40.0000 meq | INTRAMUSCULAR | Status: DC
  Filled 2016-02-27: qty 10

## 2016-02-27 MED ORDER — POTASSIUM CHLORIDE 2 MEQ/ML IV SOLN
80.0000 meq | INTRAVENOUS | Status: DC
  Filled 2016-02-27: qty 40

## 2016-02-27 MED ORDER — TRANEXAMIC ACID (OHS) BOLUS VIA INFUSION
15.0000 mg/kg | INTRAVENOUS | Status: AC
  Administered 2016-02-28: 1447.5 mg via INTRAVENOUS
  Filled 2016-02-27: qty 1448

## 2016-02-27 MED ORDER — NITROGLYCERIN IN D5W 200-5 MCG/ML-% IV SOLN
2.0000 ug/min | INTRAVENOUS | Status: AC
  Administered 2016-02-28: 16.6 ug/min via INTRAVENOUS
  Filled 2016-02-27: qty 250

## 2016-02-27 MED ORDER — DEXMEDETOMIDINE HCL IN NACL 400 MCG/100ML IV SOLN
0.1000 ug/kg/h | INTRAVENOUS | Status: AC
Start: 2016-02-28 — End: 2016-02-28
  Administered 2016-02-28: .4 ug/kg/h via INTRAVENOUS
  Administered 2016-02-28: .7 ug/kg/h via INTRAVENOUS
  Filled 2016-02-27: qty 100

## 2016-02-27 MED ORDER — PAPAVERINE HCL 30 MG/ML IJ SOLN
INTRAMUSCULAR | Status: AC
  Administered 2016-02-28: 500 mL
  Filled 2016-02-27: qty 2.5

## 2016-02-27 MED ORDER — SODIUM CHLORIDE 0.9 % IV SOLN
INTRAVENOUS | Status: AC
  Administered 2016-02-28: 1 [IU]/h via INTRAVENOUS
  Filled 2016-02-27: qty 2.5

## 2016-02-27 MED ORDER — DOPAMINE-DEXTROSE 3.2-5 MG/ML-% IV SOLN
0.0000 ug/kg/min | INTRAVENOUS | Status: DC
  Filled 2016-02-27: qty 250

## 2016-02-27 MED ORDER — SODIUM CHLORIDE 0.9 % IV SOLN
INTRAVENOUS | Status: DC
  Filled 2016-02-27: qty 30

## 2016-02-27 MED ORDER — TRANEXAMIC ACID (OHS) PUMP PRIME SOLUTION
2.0000 mg/kg | INTRAVENOUS | Status: DC
  Filled 2016-02-27: qty 1.93

## 2016-02-27 MED ORDER — SODIUM CHLORIDE 0.9 % IV SOLN
1.5000 mg/kg/h | INTRAVENOUS | Status: AC
  Administered 2016-02-28: 1.5 mg/kg/h via INTRAVENOUS
  Filled 2016-02-27: qty 25

## 2016-02-27 MED ORDER — DEXTROSE 5 % IV SOLN
1.5000 g | INTRAVENOUS | Status: DC
  Filled 2016-02-27 (×2): qty 1.5

## 2016-02-27 NOTE — Anesthesia Preprocedure Evaluation (Addendum)
Anesthesia Evaluation  Patient identified by MRN, date of birth, ID band Patient awake    Reviewed: Allergy & Precautions, NPO status , Patient's Chart, lab work & pertinent test results  Airway Mallampati: II  TM Distance: >3 FB Neck ROM: Full    Dental  (+) Teeth Intact, Dental Advisory Given   Pulmonary sleep apnea and Continuous Positive Airway Pressure Ventilation ,    Pulmonary exam normal breath sounds clear to auscultation       Cardiovascular hypertension, Pt. on medications + CAD  Normal cardiovascular exam Rhythm:Regular Rate:Normal  Cath 01/31/16: Ost Cx lesion, 75 %stenosed. LM lesion, 50 %stenosed. The left ventricular systolic function is normal. The left ventricular ejection fraction is 50-55% by visual estimate. LV end diastolic pressure is mildly elevated. Eccentric 70-80% proximal/ostial circumflex. Circumflex territory is large. 50% stenosis in the distal left main. Codominant right coronary Overall normal LV function with mild elevation and end-diastolic pressure.  Echo 01/2014: Study Conclusions  - Left ventricle: The cavity size was normal. Wall thickness wasincreased in a pattern of mild LVH. Systolic function was normal.The estimated ejection fraction was in the range of 55% to 60%.Wall motion was normal; there were no regional wall motionabnormalities. Doppler parameters are consistent with abnormalleft ventricular relaxation (grade 1 diastolic dysfunction). Doppler parameters are consistent with elevated mean left atrialfilling pressure. Medial E/e&' 12.4. - Left atrium: The atrium was moderately dilated. - Right atrium: The atrium was mildly dilated. - Pulmonic valve: There was mild regurgitation.   Neuro/Psych PSYCHIATRIC DISORDERS Depression negative neurological ROS     GI/Hepatic Neg liver ROS, hiatal hernia, GERD  Medicated,  Endo/Other  diabetes, Type 2, Oral Hypoglycemic  AgentsObesity   Renal/GU negative Renal ROS     Musculoskeletal negative musculoskeletal ROS (+)   Abdominal   Peds  Hematology negative hematology ROS (+)   Anesthesia Other Findings Day of surgery medications reviewed with the patient.  Reproductive/Obstetrics                           Anesthesia Physical Anesthesia Plan  ASA: IV  Anesthesia Plan: General   Post-op Pain Management:    Induction: Intravenous  Airway Management Planned: Oral ETT  Additional Equipment: Arterial line, TEE, CVP, Ultrasound Guidance Line Placement and PA Cath  Intra-op Plan:   Post-operative Plan: Post-operative intubation/ventilation  Informed Consent: I have reviewed the patients History and Physical, chart, labs and discussed the procedure including the risks, benefits and alternatives for the proposed anesthesia with the patient or authorized representative who has indicated his/her understanding and acceptance.   Dental advisory given  Plan Discussed with: CRNA, Anesthesiologist and Surgeon  Anesthesia Plan Comments: (Risks/benefits of general anesthesia discussed with patient including risk of damage to teeth, lips, gum, and tongue, nausea/vomiting, allergic reactions to medications, and the possibility of heart attack, stroke and death.  All patient questions answered.  Patient wishes to proceed.)       Anesthesia Quick Evaluation

## 2016-02-27 NOTE — H&P (Signed)
[address]       [place] [address]             [phone]      Cardiothoracic Surgery History and Physical   PCP is Wende Neighbors, MD Referring Provider is Belva Crome, MD      Chief Complaint  Patient presents with  . Coronary Artery Disease        HPI:  The patient is a 65 year old gentleman with a history of hypertension and hyperlipidemia who has been followed by Dr. Harl Bowie for chronic exertional chest pain for the past couple years. He has a low risk nuclear stress test in 01/2014 and an echo at that time showed normal LV function with an EF of 55-60%. He continued to have symptoms and underwent a nuclear stress test in 07/2015 showing a small, moderate intensity, reversible defect in the mid to apical inferolateral myocardium. The EF ws 37%. He was offered cath at that time but decided to continue medical therapy. He has continued to have exertional chest pain when walking up an incline or mowing his grass. He has been using NTG. He decided to proceed with cath on 01/31/2016 which showed an eccentric 70-80% ostial/proximal LCX lesion in a large vessel. The LM had 50% distal tapering stenosis. LV function was normal with an LVEDP of 21.      Past Medical History:  Diagnosis Date  . Adenomatous colon polyp 08/2010  . Chronic back pain   . Hiatal hernia   . HTN (hypertension)   . Hyperlipidemia   . Renal cyst    Large Left  . Sleep apnea          Past Surgical History:  Procedure Laterality Date  . APPENDECTOMY  1967  . CARDIAC CATHETERIZATION N/A 01/31/2016   Procedure: Left Heart Cath and Coronary Angiography;  Surgeon: Belva Crome, MD;  Location: [facility];  Service: Cardiovascular;  Laterality: N/A;  . CARDIOVASCULAR STRESS TEST  2004   Treadmill Stress Test  . CARDIOVASCULAR STRESS TEST  2010  . CHOLECYSTECTOMY  07/2006  . COLONOSCOPY  08/2010   RMR: Cecal tubular adenoma removed, otherwise normal exam.  next tcs 08/2015  . KNEE SURGERY  1995   Micro  . UPPER GASTROINTESTINAL ENDOSCOPY  08/2010   RMR: GERD benign biopsy          Family History  Problem Relation Age of Onset  . Lymphoma Mother   . Ulcers Father   . Ulcers Paternal Grandfather     Stomach ulcer, resection  . Heart attack Other   . Diabetes Other     Social History       Social History   Substance Use Topics   . Smoking status: Never Smoker   . Smokeless tobacco: Never Used   . Alcohol use 1.2 oz/week    2 Standard drinks or equivalent per week          Current Outpatient Prescriptions  Medication Sig Dispense Refill  . amLODipine (NORVASC) 5 MG tablet Take 1 tablet (5 mg total) by mouth daily. 90 tablet 3  . aspirin 81 MG tablet Take 81 mg by mouth daily.    Marland Kitchen esomeprazole (NEXIUM) 20 MG capsule Take 20 mg by mouth daily as needed (heartburn).    Marland Kitchen lisinopril (PRINIVIL,ZESTRIL) 40 MG tablet Take 1 tablet (40 mg total) by mouth daily. 90 tablet 3  . metFORMIN (GLUCOPHAGE) 500 MG tablet  Take 500 mg by mouth 2 (two) times daily with a meal.    . nitroGLYCERIN (NITROSTAT) 0.4 MG SL tablet Place 1 tablet (0.4 mg total) under the tongue every 5 (five) minutes as needed for chest pain. 25 tablet 1  . pravastatin (PRAVACHOL) 20 MG tablet Take 1 tablet (20 mg total) by mouth every evening. 90 tablet 3  . triamterene-hydrochlorothiazide (MAXZIDE-25) 37.5-25 MG tablet Take 1 tablet by mouth daily. 90 tablet 3   No current facility-administered medications for this visit.          Allergies  Allergen Reactions  . Other     Medication for insomnia  . Simvastatin     REACTION: arm numbness Patient currently on this medication (03/2012).    Review of Systems  Constitutional: Positive for activity change. Negative for fatigue.  HENT: Negative.   Eyes: Negative.   Respiratory: Positive for chest tightness and shortness of breath.   Cardiovascular: Positive for chest pain.  Negative for palpitations and leg swelling.  Gastrointestinal: Negative.   Endocrine: Negative.   Genitourinary: Negative.   Musculoskeletal: Negative.   Allergic/Immunologic: Negative.   Neurological: Negative.   Hematological: Negative.   Psychiatric/Behavioral: Negative.     BP (!) 151/91   Pulse 69   Resp 16   Ht 5\' 9"  (1.753 m)   Wt 208 lb (94.3 kg)   SpO2 98% Comment: ON RA  BMI 30.72 kg/m  Physical Exam  Constitutional: He is oriented to person, place, and time. He appears well-developed and well-nourished. No distress.  HENT:  Head: Normocephalic and atraumatic.  Eyes: EOM are normal. Pupils are equal, round, and reactive to light.  Neck: Normal range of motion. Neck supple. No JVD present. No thyromegaly present.  Cardiovascular: Normal rate, regular rhythm, normal heart sounds and intact distal pulses.   No murmur heard. Pulmonary/Chest: Effort normal and breath sounds normal. No respiratory distress.  Abdominal: Soft. Bowel sounds are normal. He exhibits no distension and no mass. There is no tenderness.  Musculoskeletal: Normal range of motion. He exhibits no edema.  Lymphadenopathy:    He has no cervical adenopathy.  Neurological: He is alert and oriented to person, place, and time. He has normal strength. No cranial nerve deficit or sensory deficit.  Skin: Skin is warm and dry.  Psychiatric: He has a normal mood and affect.    Diagnostic Tests:  Physicians   Panel Physicians Referring Physician Case Authorizing Physician  Belva Crome, MD (Primary)    Procedures   Left Heart Cath and Coronary Angiography  Conclusion     Ost Cx lesion, 75 %stenosed.  LM lesion, 50 %stenosed.  The left ventricular systolic function is normal.  The left ventricular ejection fraction is 50-55% by visual estimate.  LV end diastolic pressure is mildly elevated.   Eccentric 70-80% proximal/ostial circumflex. Circumflex territory is large.  50%  stenosis in the distal left main.  Codominant right coronary  Overall normal LV function with mild elevation and end-diastolic pressure.  RECOMMENDATIONS:   Optimize medical therapy, although the patient is unable to use statin therapy due to side effects. Will add low-dose beta blocker therapy. Beta-blockade dosing will be compromised by relatively slow resting heart rate.  Consider CABG versus higher risk PCI of the ostial circumflex in the setting of distal left main disease..    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 Intravenous sedation with Versed and fentanyl was administered. 1% Xylocaine was infiltrated to achieve local analgesia. A double wall stick with an angiocath was utilized to obtain intra-arterial access. The modified Seldinger technique was used to place a 1F " Slender" sheath in the right radial artery. Weight based heparin was administered. Coronary angiography was done using 5 F catheters. Right coronary angiography was performed with a JR4. Left ventricular hemodymic recordings and angiography was done using the JR 4 catheter and hand injection. Left coronary angiography was performed with a JL 3.5 cm.  Hemostasis was achieved using a pneumatic band.  During this procedure the patient is administered a total of Versed 2 mg and Fentanyl 50 mg to achieve and maintain moderate conscious sedation. The patient's heart rate, blood pressure, and oxygen saturation are monitored continuously during the procedure. The period of conscious sedation is 26 minutes, of which I was present face-to-face 100% of this time.   Estimated blood loss <50 mL.  During this procedure the patient was administered the following to achieve and  maintain moderate conscious sedation: Versed 2 mg, Fentanyl 50 mcg, while the patient's heart rate, blood pressure, and oxygen saturation were continuously monitored. The period of conscious sedation was 26 minutes, of which I was present face-to-face 100% of this time.    Coronary Findings   Dominance: Co-dominant  Left Main  LM lesion, 50% stenosed.  Left Circumflex  Ost Cx lesion, 75% stenosed. The lesion is eccentric.  Right Coronary Artery  Right Posterior Atrioventricular Branch  Vessel is small in size.  Wall Motion        Resting               Left Heart   Left Ventricle The left ventricular size is normal. The left ventricular systolic function is normal. LV end diastolic pressure is mildly elevated. The left ventricular ejection fraction is 50-55% by visual estimate. No regional wall motion abnormalities.    Coronary Diagrams   Diagnostic Diagram     Implants        No implant documentation for this case.  PACS Images   Show images for Cardiac catheterization   Link to Procedure Log   Procedure Log    Hemo Data   Flowsheet Row Most Recent Value  AO Systolic Pressure 98 mmHg  AO Diastolic Pressure 60 mmHg  AO Mean 75 mmHg  LV Systolic Pressure [id number] mmHg  LV Diastolic Pressure 11 mmHg  LV EDP 21 mmHg  Arterial Occlusion Pressure Extended Systolic Pressure 123XX123 mmHg  Arterial Occlusion Pressure Extended Diastolic Pressure 66 mmHg  Arterial Occlusion Pressure Extended Mean Pressure 84 mmHg  Left Ventricular Apex Extended Systolic Pressure 123456 mmHg  Left Ventricular Apex Extended Diastolic Pressure 10 mmHg  Left Ventricular Apex Extended EDP Pressure 16 mmHg    Impression:  This 65 year old gentleman presents with stable exertional angina occurring with low level activity. His nuclear stress test showed a reversible defect in the region supplied by the LCX. I have personally reviewed and interpreted his cath films and this proximal LCX  lesion compromises a large territory and is close enough to the stenosed distal left main that a PCI would be risky. His symptoms are severely limiting his lifestyle and I think CABG is the best treatment for him.  I discussed the operative procedure with the patient and his wife including alternatives, benefits and risks; including  but not limited to bleeding, blood transfusion, infection, stroke, myocardial infarction, graft failure, heart block requiring a permanent pacemaker, organ dysfunction, and death.  I answered all of their questions. [name] understands and agrees to proceed.                                                             Plan:  Coronary artery bypass graft surgery the LAD and LCX.  Gaye Pollack, MD [facility] [phone]

## 2016-02-28 ENCOUNTER — Inpatient Hospital Stay (HOSPITAL_COMMUNITY): Payer: Medicare HMO | Admitting: Anesthesiology

## 2016-02-28 ENCOUNTER — Encounter (HOSPITAL_COMMUNITY): Payer: Self-pay | Admitting: *Deleted

## 2016-02-28 ENCOUNTER — Inpatient Hospital Stay (HOSPITAL_COMMUNITY): Payer: Medicare HMO

## 2016-02-28 ENCOUNTER — Encounter (HOSPITAL_COMMUNITY)
Admission: RE | Disposition: A | Discharge status: Home / Self Care | Payer: Self-pay | Source: Ambulatory Visit | Attending: Surgery

## 2016-02-28 ENCOUNTER — Inpatient Hospital Stay (HOSPITAL_COMMUNITY): Payer: Medicare HMO | Admitting: Critical Care Medicine

## 2016-02-28 ENCOUNTER — Inpatient Hospital Stay (HOSPITAL_COMMUNITY)
Admission: RE | Admit: 2016-02-28 | Discharge: 2016-03-04 | DRG: 236 | Disposition: A | Discharge status: Home / Self Care | Payer: Medicare HMO | Source: Ambulatory Visit | Attending: Surgery | Admitting: Surgery

## 2016-02-28 DIAGNOSIS — G8929 Other chronic pain: Secondary | ICD-10-CM | POA: Diagnosis present

## 2016-02-28 DIAGNOSIS — I493 Ventricular premature depolarization: Secondary | ICD-10-CM | POA: Diagnosis not present

## 2016-02-28 DIAGNOSIS — E119 Type 2 diabetes mellitus without complications: Secondary | ICD-10-CM | POA: Diagnosis not present

## 2016-02-28 DIAGNOSIS — R918 Other nonspecific abnormal finding of lung field: Secondary | ICD-10-CM | POA: Diagnosis not present

## 2016-02-28 DIAGNOSIS — Z79899 Other long term (current) drug therapy: Secondary | ICD-10-CM | POA: Diagnosis not present

## 2016-02-28 DIAGNOSIS — Z7984 Long term (current) use of oral hypoglycemic drugs: Secondary | ICD-10-CM

## 2016-02-28 DIAGNOSIS — J9811 Atelectasis: Secondary | ICD-10-CM | POA: Diagnosis not present

## 2016-02-28 DIAGNOSIS — M549 Dorsalgia, unspecified: Secondary | ICD-10-CM | POA: Diagnosis present

## 2016-02-28 DIAGNOSIS — I25118 Atherosclerotic heart disease of native coronary artery with other forms of angina pectoris: Secondary | ICD-10-CM | POA: Diagnosis not present

## 2016-02-28 DIAGNOSIS — G4730 Sleep apnea, unspecified: Secondary | ICD-10-CM | POA: Diagnosis not present

## 2016-02-28 DIAGNOSIS — D649 Anemia, unspecified: Secondary | ICD-10-CM | POA: Diagnosis not present

## 2016-02-28 DIAGNOSIS — I10 Essential (primary) hypertension: Secondary | ICD-10-CM | POA: Diagnosis not present

## 2016-02-28 DIAGNOSIS — J029 Acute pharyngitis, unspecified: Secondary | ICD-10-CM | POA: Diagnosis not present

## 2016-02-28 DIAGNOSIS — Z8249 Family history of ischemic heart disease and other diseases of the circulatory system: Secondary | ICD-10-CM | POA: Diagnosis not present

## 2016-02-28 DIAGNOSIS — Z888 Allergy status to other drugs, medicaments and biological substances status: Secondary | ICD-10-CM

## 2016-02-28 DIAGNOSIS — Z7982 Long term (current) use of aspirin: Secondary | ICD-10-CM | POA: Diagnosis not present

## 2016-02-28 DIAGNOSIS — E785 Hyperlipidemia, unspecified: Secondary | ICD-10-CM | POA: Diagnosis present

## 2016-02-28 DIAGNOSIS — Z23 Encounter for immunization: Secondary | ICD-10-CM | POA: Diagnosis not present

## 2016-02-28 DIAGNOSIS — K219 Gastro-esophageal reflux disease without esophagitis: Secondary | ICD-10-CM | POA: Diagnosis not present

## 2016-02-28 DIAGNOSIS — I080 Rheumatic disorders of both mitral and aortic valves: Secondary | ICD-10-CM | POA: Diagnosis not present

## 2016-02-28 DIAGNOSIS — Z951 Presence of aortocoronary bypass graft: Secondary | ICD-10-CM

## 2016-02-28 DIAGNOSIS — Z459 Encounter for adjustment and management of unspecified implanted device: Secondary | ICD-10-CM | POA: Diagnosis not present

## 2016-02-28 DIAGNOSIS — I2510 Atherosclerotic heart disease of native coronary artery without angina pectoris: Secondary | ICD-10-CM | POA: Diagnosis not present

## 2016-02-28 DIAGNOSIS — Z789 Other specified health status: Secondary | ICD-10-CM

## 2016-02-28 DIAGNOSIS — E8770 Fluid overload, unspecified: Secondary | ICD-10-CM | POA: Diagnosis not present

## 2016-02-28 DIAGNOSIS — J9690 Respiratory failure, unspecified, unspecified whether with hypoxia or hypercapnia: Secondary | ICD-10-CM | POA: Diagnosis not present

## 2016-02-28 HISTORY — PX: CORONARY ARTERY BYPASS GRAFT: SHX141

## 2016-02-28 HISTORY — PX: TEE WITHOUT CARDIOVERSION: SHX5443

## 2016-02-28 LAB — HEMOGLOBIN AND HEMATOCRIT, BLOOD
HCT: 29.8 % — ABNORMAL LOW (ref 39.0–52.0)
HEMOGLOBIN: 10 g/dL — AB (ref 13.0–17.0)

## 2016-02-28 LAB — POCT I-STAT, CHEM 8
BUN: 13 mg/dL (ref 6–20)
BUN: 12 mg/dL (ref 6–20)
BUN: 11 mg/dL (ref 6–20)
BUN: 11 mg/dL (ref 6–20)
BUN: 11 mg/dL (ref 6–20)
CALCIUM ION: 1.18 mmol/L (ref 1.15–1.40)
CALCIUM ION: 1.04 mmol/L — AB (ref 1.15–1.40)
CHLORIDE: 104 mmol/L (ref 101–111)
CHLORIDE: 104 mmol/L (ref 101–111)
CREATININE: 0.7 mg/dL (ref 0.61–1.24)
Calcium, Ion: 1.14 mmol/L — ABNORMAL LOW (ref 1.15–1.40)
Calcium, Ion: 0.97 mmol/L — ABNORMAL LOW (ref 1.15–1.40)
Calcium, Ion: 1.02 mmol/L — ABNORMAL LOW (ref 1.15–1.40)
Chloride: 105 mmol/L (ref 101–111)
Chloride: 105 mmol/L (ref 101–111)
Chloride: 109 mmol/L (ref 101–111)
Creatinine, Ser: 0.6 mg/dL — ABNORMAL LOW (ref 0.61–1.24)
Creatinine, Ser: 0.8 mg/dL (ref 0.61–1.24)
Creatinine, Ser: 0.5 mg/dL — ABNORMAL LOW (ref 0.61–1.24)
Creatinine, Ser: 0.8 mg/dL (ref 0.61–1.24)
GLUCOSE: 106 mg/dL — AB (ref 65–99)
GLUCOSE: 137 mg/dL — AB (ref 65–99)
Glucose, Bld: 114 mg/dL — ABNORMAL HIGH (ref 65–99)
Glucose, Bld: 100 mg/dL — ABNORMAL HIGH (ref 65–99)
Glucose, Bld: 133 mg/dL — ABNORMAL HIGH (ref 65–99)
HCT: 37 % — ABNORMAL LOW (ref 39.0–52.0)
HCT: 37 % — ABNORMAL LOW (ref 39.0–52.0)
HEMATOCRIT: 36 % — AB (ref 39.0–52.0)
HEMATOCRIT: 29 % — AB (ref 39.0–52.0)
HEMATOCRIT: 28 % — AB (ref 39.0–52.0)
HEMOGLOBIN: 12.6 g/dL — AB (ref 13.0–17.0)
HEMOGLOBIN: 12.2 g/dL — AB (ref 13.0–17.0)
HEMOGLOBIN: 9.9 g/dL — AB (ref 13.0–17.0)
HEMOGLOBIN: 9.5 g/dL — AB (ref 13.0–17.0)
Hemoglobin: 12.6 g/dL — ABNORMAL LOW (ref 13.0–17.0)
POTASSIUM: 3.1 mmol/L — AB (ref 3.5–5.1)
POTASSIUM: 3 mmol/L — AB (ref 3.5–5.1)
POTASSIUM: 3.3 mmol/L — AB (ref 3.5–5.1)
POTASSIUM: 3.5 mmol/L (ref 3.5–5.1)
Potassium: 3.8 mmol/L (ref 3.5–5.1)
SODIUM: 142 mmol/L (ref 135–145)
SODIUM: 141 mmol/L (ref 135–145)
Sodium: 142 mmol/L (ref 135–145)
Sodium: 141 mmol/L (ref 135–145)
Sodium: 144 mmol/L (ref 135–145)
TCO2: 28 mmol/L (ref 0–100)
TCO2: 24 mmol/L (ref 0–100)
TCO2: 26 mmol/L (ref 0–100)
TCO2: 24 mmol/L (ref 0–100)
TCO2: 24 mmol/L (ref 0–100)

## 2016-02-28 LAB — POCT I-STAT 3, ART BLOOD GAS (G3+)
ACID-BASE DEFICIT: 1 mmol/L (ref 0.0–2.0)
ACID-BASE DEFICIT: 1 mmol/L (ref 0.0–2.0)
ACID-BASE DEFICIT: 3 mmol/L — AB (ref 0.0–2.0)
ACID-BASE DEFICIT: 2 mmol/L (ref 0.0–2.0)
Acid-Base Excess: 1 mmol/L (ref 0.0–2.0)
Acid-base deficit: 1 mmol/L (ref 0.0–2.0)
BICARBONATE: 27.6 mmol/L (ref 20.0–28.0)
BICARBONATE: 24.3 mmol/L (ref 20.0–28.0)
BICARBONATE: 25.2 mmol/L (ref 20.0–28.0)
BICARBONATE: 24.6 mmol/L (ref 20.0–28.0)
Bicarbonate: 23.2 mmol/L (ref 20.0–28.0)
Bicarbonate: 27.5 mmol/L (ref 20.0–28.0)
Bicarbonate: 23.8 mmol/L (ref 20.0–28.0)
O2 SAT: 94 %
O2 Saturation: 100 %
O2 Saturation: 100 %
O2 Saturation: 99 %
O2 Saturation: 95 %
O2 Saturation: 95 %
O2 Saturation: 98 %
PCO2 ART: 42.3 mmHg (ref 32.0–48.0)
PCO2 ART: 43.8 mmHg (ref 32.0–48.0)
PCO2 ART: 66.6 mmHg — AB (ref 32.0–48.0)
PH ART: 7.351 (ref 7.350–7.450)
PH ART: 7.368 (ref 7.350–7.450)
PH ART: 7.385 (ref 7.350–7.450)
PH ART: 7.227 — AB (ref 7.350–7.450)
PH ART: 7.325 — AB (ref 7.350–7.450)
PH ART: 7.348 — AB (ref 7.350–7.450)
PO2 ART: 119 mmHg — AB (ref 83.0–108.0)
PO2 ART: 77 mmHg — AB (ref 83.0–108.0)
PO2 ART: 80 mmHg — AB (ref 83.0–108.0)
Patient temperature: 36.4
Patient temperature: 37.2
Patient temperature: 37.7
TCO2: 29 mmol/L (ref 0–100)
TCO2: 26 mmol/L (ref 0–100)
TCO2: 26 mmol/L (ref 0–100)
TCO2: 26 mmol/L (ref 0–100)
TCO2: 25 mmol/L (ref 0–100)
TCO2: 29 mmol/L (ref 0–100)
TCO2: 25 mmol/L (ref 0–100)
pCO2 arterial: 49.9 mmHg — ABNORMAL HIGH (ref 32.0–48.0)
pCO2 arterial: 42.1 mmHg (ref 32.0–48.0)
pCO2 arterial: 44.6 mmHg (ref 32.0–48.0)
pCO2 arterial: 43.3 mmHg (ref 32.0–48.0)
pH, Arterial: 7.354 (ref 7.350–7.450)
pO2, Arterial: 465 mmHg — ABNORMAL HIGH (ref 83.0–108.0)
pO2, Arterial: 464 mmHg — ABNORMAL HIGH (ref 83.0–108.0)
pO2, Arterial: 88 mmHg (ref 83.0–108.0)
pO2, Arterial: 108 mmHg (ref 83.0–108.0)

## 2016-02-28 LAB — POCT I-STAT 4, (NA,K, GLUC, HGB,HCT)
Glucose, Bld: 125 mg/dL — ABNORMAL HIGH (ref 65–99)
HCT: 34 % — ABNORMAL LOW (ref 39.0–52.0)
Hemoglobin: 11.6 g/dL — ABNORMAL LOW (ref 13.0–17.0)
Potassium: 3.3 mmol/L — ABNORMAL LOW (ref 3.5–5.1)
SODIUM: 144 mmol/L (ref 135–145)

## 2016-02-28 LAB — GLUCOSE, CAPILLARY
GLUCOSE-CAPILLARY: 109 mg/dL — AB (ref 65–99)
GLUCOSE-CAPILLARY: 116 mg/dL — AB (ref 65–99)
GLUCOSE-CAPILLARY: 125 mg/dL — AB (ref 65–99)
GLUCOSE-CAPILLARY: 123 mg/dL — AB (ref 65–99)
GLUCOSE-CAPILLARY: 127 mg/dL — AB (ref 65–99)
GLUCOSE-CAPILLARY: 160 mg/dL — AB (ref 65–99)
GLUCOSE-CAPILLARY: 144 mg/dL — AB (ref 65–99)
Glucose-Capillary: 109 mg/dL — ABNORMAL HIGH (ref 65–99)
Glucose-Capillary: 116 mg/dL — ABNORMAL HIGH (ref 65–99)
Glucose-Capillary: 125 mg/dL — ABNORMAL HIGH (ref 65–99)
Glucose-Capillary: 130 mg/dL — ABNORMAL HIGH (ref 65–99)
Glucose-Capillary: 143 mg/dL — ABNORMAL HIGH (ref 65–99)

## 2016-02-28 LAB — CBC
HCT: 35.7 % — ABNORMAL LOW (ref 39.0–52.0)
HEMATOCRIT: 38.7 % — AB (ref 39.0–52.0)
HEMOGLOBIN: 12.8 g/dL — AB (ref 13.0–17.0)
Hemoglobin: 11.7 g/dL — ABNORMAL LOW (ref 13.0–17.0)
MCH: 26.5 pg (ref 26.0–34.0)
MCH: 26.9 pg (ref 26.0–34.0)
MCHC: 32.8 g/dL (ref 30.0–36.0)
MCHC: 33.1 g/dL (ref 30.0–36.0)
MCV: 80.8 fL (ref 78.0–100.0)
MCV: 81.3 fL (ref 78.0–100.0)
PLATELETS: 194 10*3/uL (ref 150–400)
Platelets: 198 10*3/uL (ref 150–400)
RBC: 4.42 MIL/uL (ref 4.22–5.81)
RBC: 4.76 MIL/uL (ref 4.22–5.81)
RDW: 13.8 % (ref 11.5–15.5)
RDW: 14 % (ref 11.5–15.5)
WBC: 11.5 10*3/uL — AB (ref 4.0–10.5)
WBC: 11.3 10*3/uL — ABNORMAL HIGH (ref 4.0–10.5)

## 2016-02-28 LAB — PLATELET COUNT: Platelets: 194 10*3/uL (ref 150–400)

## 2016-02-28 LAB — APTT: APTT: 32 s (ref 24–36)

## 2016-02-28 LAB — CREATININE, SERUM
Creatinine, Ser: 0.86 mg/dL (ref 0.61–1.24)
GFR calc Af Amer: >60 mL/min (ref >60)

## 2016-02-28 LAB — PROTIME-INR
INR: 1.21
Prothrombin Time: 15.3 seconds — ABNORMAL HIGH (ref 11.4–15.2)

## 2016-02-28 LAB — MAGNESIUM: Magnesium: 2.5 mg/dL — ABNORMAL HIGH (ref 1.7–2.4)

## 2016-02-28 SURGERY — CORONARY ARTERY BYPASS GRAFTING (CABG)
Anesthesia: General | Site: Chest

## 2016-02-28 MED ORDER — SUCCINYLCHOLINE CHLORIDE 20 MG/ML IJ SOLN
INTRAMUSCULAR | Status: DC | PRN
  Administered 2016-02-28: 120 mg via INTRAVENOUS

## 2016-02-28 MED ORDER — PROPOFOL 10 MG/ML IV BOLUS
INTRAVENOUS | Status: DC | PRN
  Administered 2016-02-28: 100 mg via INTRAVENOUS

## 2016-02-28 MED ORDER — MIDAZOLAM HCL 2 MG/2ML IJ SOLN
INTRAMUSCULAR | Status: AC
  Filled 2016-02-28: qty 2

## 2016-02-28 MED ORDER — PHENYLEPHRINE HCL 10 MG/ML IJ SOLN
INTRAMUSCULAR | Status: DC | PRN
  Administered 2016-02-28: 120 ug via INTRAVENOUS

## 2016-02-28 MED ORDER — LACTATED RINGERS IV SOLN
INTRAVENOUS | Status: DC | PRN
  Administered 2016-02-28: 07:00:00 via INTRAVENOUS

## 2016-02-28 MED ORDER — TRAMADOL HCL 50 MG PO TABS: 50.0000 mg | ORAL_TABLET | ORAL | Status: DC | PRN

## 2016-02-28 MED ORDER — VECURONIUM BROMIDE 10 MG IV SOLR
INTRAVENOUS | Status: DC | PRN
  Administered 2016-02-28: 5 mg via INTRAVENOUS
  Administered 2016-02-28: 10 mg via INTRAVENOUS
  Administered 2016-02-28: 1 mg via INTRAVENOUS

## 2016-02-28 MED ORDER — CHLORHEXIDINE GLUCONATE 4 % EX LIQD
30.0000 mL | CUTANEOUS | Status: DC
  Administered 2016-02-28: 2 via TOPICAL

## 2016-02-28 MED ORDER — ACETAMINOPHEN 650 MG RE SUPP
650.0000 mg | Freq: Once | RECTAL | Status: AC
  Administered 2016-02-28: 650 mg via RECTAL

## 2016-02-28 MED ORDER — MAGNESIUM SULFATE 4 GM/100ML IV SOLN
INTRAVENOUS | Status: AC
  Administered 2016-02-28: 4 g
  Filled 2016-02-28: qty 100

## 2016-02-28 MED ORDER — PHENYLEPHRINE 40 MCG/ML (10ML) SYRINGE FOR IV PUSH (FOR BLOOD PRESSURE SUPPORT)
PREFILLED_SYRINGE | INTRAVENOUS | Status: AC
  Filled 2016-02-28: qty 10

## 2016-02-28 MED ORDER — ALBUMIN HUMAN 5 % IV SOLN
250.0000 mL | INTRAVENOUS | Status: AC | PRN
  Administered 2016-02-28 (×2): 250 mL via INTRAVENOUS

## 2016-02-28 MED ORDER — SODIUM CHLORIDE 0.9 % IV SOLN: 250.0000 mL | INTRAVENOUS | Status: DC

## 2016-02-28 MED ORDER — ACETAMINOPHEN 160 MG/5ML PO SOLN: 650.0000 mg | Freq: Once | ORAL | Status: AC

## 2016-02-28 MED ORDER — FAMOTIDINE IN NACL 20-0.9 MG/50ML-% IV SOLN
20.0000 mg | Freq: Two times a day (BID) | INTRAVENOUS | Status: AC
  Administered 2016-02-28 (×2): 20 mg via INTRAVENOUS
  Filled 2016-02-28: qty 50

## 2016-02-28 MED ORDER — SODIUM CHLORIDE 0.9 % IV SOLN
INTRAVENOUS | Status: DC
  Administered 2016-02-28: 1.2 [IU]/h via INTRAVENOUS
  Filled 2016-02-28: qty 2.5

## 2016-02-28 MED ORDER — LACTATED RINGERS IV SOLN: 500.0000 mL | Freq: Once | INTRAVENOUS | Status: DC | PRN

## 2016-02-28 MED ORDER — ACETAMINOPHEN 500 MG PO TABS
1000.0000 mg | ORAL_TABLET | Freq: Four times a day (QID) | ORAL | Status: DC
  Administered 2016-02-29 – 2016-03-01 (×4): 1000 mg via ORAL
  Filled 2016-02-28 (×4): qty 2

## 2016-02-28 MED ORDER — SODIUM CHLORIDE 0.9 % IV SOLN
INTRAVENOUS | Status: DC | PRN
  Administered 2016-02-28: 08:00:00 via INTRAVENOUS

## 2016-02-28 MED ORDER — SODIUM CHLORIDE 0.9 % IV SOLN
INTRAVENOUS | Status: DC
  Administered 2016-02-29: 05:00:00 via INTRAVENOUS

## 2016-02-28 MED ORDER — MAGNESIUM SULFATE 4 GM/100ML IV SOLN
4.0000 g | Freq: Once | INTRAVENOUS | Status: AC
  Administered 2016-02-28: 4 g via INTRAVENOUS

## 2016-02-28 MED ORDER — PROTAMINE SULFATE 10 MG/ML IV SOLN
INTRAVENOUS | Status: DC | PRN
  Administered 2016-02-28: 280 mg via INTRAVENOUS

## 2016-02-28 MED ORDER — DEXTROSE 5 % IV SOLN
1.5000 g | Freq: Two times a day (BID) | INTRAVENOUS | Status: AC
  Administered 2016-02-28 – 2016-03-01 (×4): 1.5 g via INTRAVENOUS
  Filled 2016-02-28 (×4): qty 1.5

## 2016-02-28 MED ORDER — MORPHINE SULFATE (PF) 2 MG/ML IV SOLN
1.0000 mg | INTRAVENOUS | Status: AC | PRN
  Administered 2016-02-28: 2 mg via INTRAVENOUS
  Administered 2016-02-28: 4 mg via INTRAVENOUS
  Administered 2016-02-28: 2 mg via INTRAVENOUS
  Filled 2016-02-28 (×2): qty 2

## 2016-02-28 MED ORDER — MIDAZOLAM HCL 2 MG/2ML IJ SOLN
2.0000 mg | INTRAMUSCULAR | Status: DC | PRN
  Administered 2016-02-28 (×3): 2 mg via INTRAVENOUS
  Filled 2016-02-28 (×3): qty 2

## 2016-02-28 MED ORDER — PROTAMINE SULFATE 10 MG/ML IV SOLN
INTRAVENOUS | Status: AC
  Filled 2016-02-28: qty 5

## 2016-02-28 MED ORDER — ASPIRIN EC 325 MG PO TBEC
325.0000 mg | DELAYED_RELEASE_TABLET | Freq: Every day | ORAL | Status: DC
  Administered 2016-02-29: 325 mg via ORAL
  Filled 2016-02-28: qty 1

## 2016-02-28 MED ORDER — LACTATED RINGERS IV SOLN: INTRAVENOUS | Status: DC

## 2016-02-28 MED ORDER — HEPARIN SODIUM (PORCINE) 1000 UNIT/ML IJ SOLN
INTRAMUSCULAR | Status: DC | PRN
  Administered 2016-02-28: 28000 [IU] via INTRAVENOUS

## 2016-02-28 MED ORDER — ACETAMINOPHEN 160 MG/5ML PO SOLN
1000.0000 mg | Freq: Four times a day (QID) | ORAL | Status: DC
  Administered 2016-02-28 – 2016-02-29 (×3): 1000 mg
  Filled 2016-02-28 (×3): qty 40.6

## 2016-02-28 MED ORDER — VANCOMYCIN HCL IN DEXTROSE 1-5 GM/200ML-% IV SOLN
1000.0000 mg | Freq: Once | INTRAVENOUS | Status: AC
  Administered 2016-02-28: 1000 mg via INTRAVENOUS
  Filled 2016-02-28: qty 200

## 2016-02-28 MED ORDER — DEXTROSE 5 % IV SOLN
INTRAVENOUS | Status: DC | PRN
  Administered 2016-02-28: 20 ug/min via INTRAVENOUS

## 2016-02-28 MED ORDER — HEMOSTATIC AGENTS (NO CHARGE) OPTIME
TOPICAL | Status: DC | PRN
  Administered 2016-02-28 (×4): 1 via TOPICAL

## 2016-02-28 MED ORDER — LACTATED RINGERS IV SOLN
INTRAVENOUS | Status: DC | PRN
  Administered 2016-02-28: 10:00:00 via INTRAVENOUS

## 2016-02-28 MED ORDER — HEPARIN SODIUM (PORCINE) 1000 UNIT/ML IJ SOLN
INTRAMUSCULAR | Status: AC
  Filled 2016-02-28: qty 1

## 2016-02-28 MED ORDER — BISACODYL 5 MG PO TBEC
10.0000 mg | DELAYED_RELEASE_TABLET | Freq: Every day | ORAL | Status: DC
  Administered 2016-02-29: 10 mg via ORAL
  Filled 2016-02-28: qty 2

## 2016-02-28 MED ORDER — BISACODYL 10 MG RE SUPP: 10.0000 mg | Freq: Every day | RECTAL | Status: DC

## 2016-02-28 MED ORDER — THROMBIN 20000 UNITS EX SOLR
CUTANEOUS | Status: AC
  Filled 2016-02-28: qty 20000

## 2016-02-28 MED ORDER — SODIUM CHLORIDE 0.9% FLUSH
3.0000 mL | Freq: Two times a day (BID) | INTRAVENOUS | Status: DC
  Administered 2016-02-29: 3 mL via INTRAVENOUS

## 2016-02-28 MED ORDER — DEXMEDETOMIDINE HCL IN NACL 200 MCG/50ML IV SOLN
0.1000 ug/kg/h | INTRAVENOUS | Status: DC
  Administered 2016-02-28: 1.2 ug/kg/h via INTRAVENOUS
  Administered 2016-02-28: 0.7 ug/kg/h via INTRAVENOUS
  Administered 2016-02-28: 1.2 ug/kg/h via INTRAVENOUS
  Administered 2016-02-29: 0.9 ug/kg/h via INTRAVENOUS
  Administered 2016-02-29: 0.7 ug/kg/h via INTRAVENOUS
  Filled 2016-02-28 (×6): qty 50

## 2016-02-28 MED ORDER — ARTIFICIAL TEARS OP OINT
TOPICAL_OINTMENT | OPHTHALMIC | Status: DC | PRN
  Administered 2016-02-28: 1 via OPHTHALMIC

## 2016-02-28 MED ORDER — NITROGLYCERIN IN D5W 200-5 MCG/ML-% IV SOLN: 0.0000 ug/min | INTRAVENOUS | Status: DC

## 2016-02-28 MED ORDER — CHLORHEXIDINE GLUCONATE 0.12 % MT SOLN
15.0000 mL | OROMUCOSAL | Status: AC
  Administered 2016-02-28: 15 mL via OROMUCOSAL

## 2016-02-28 MED ORDER — EPHEDRINE 5 MG/ML INJ
INTRAVENOUS | Status: AC
  Filled 2016-02-28: qty 10

## 2016-02-28 MED ORDER — METOPROLOL TARTRATE 12.5 MG HALF TABLET
12.5000 mg | ORAL_TABLET | Freq: Once | ORAL | Status: AC
  Administered 2016-02-28: 12.5 mg via ORAL
  Filled 2016-02-28: qty 1

## 2016-02-28 MED ORDER — SODIUM CHLORIDE 0.9 % IJ SOLN
INTRAMUSCULAR | Status: AC
  Filled 2016-02-28: qty 20

## 2016-02-28 MED ORDER — PROPOFOL 10 MG/ML IV BOLUS
INTRAVENOUS | Status: DC | PRN
  Administered 2016-02-28 (×2): 100 mg via INTRAVENOUS

## 2016-02-28 MED ORDER — ORAL CARE MOUTH RINSE
15.0000 mL | OROMUCOSAL | Status: DC
  Administered 2016-02-28 – 2016-02-29 (×5): 15 mL via OROMUCOSAL

## 2016-02-28 MED ORDER — SODIUM BICARBONATE 8.4 % IV SOLN
50.0000 meq | Freq: Once | INTRAVENOUS | Status: AC
  Administered 2016-02-28: 50 meq via INTRAVENOUS

## 2016-02-28 MED ORDER — THROMBIN 20000 UNITS EX SOLR
CUTANEOUS | Status: DC | PRN
  Administered 2016-02-28: 20000 [IU] via TOPICAL

## 2016-02-28 MED ORDER — CHLORHEXIDINE GLUCONATE 0.12% ORAL RINSE (MEDLINE KIT)
15.0000 mL | Freq: Two times a day (BID) | OROMUCOSAL | Status: DC
  Administered 2016-02-28: 15 mL via OROMUCOSAL

## 2016-02-28 MED ORDER — SODIUM CHLORIDE 0.9 % IR SOLN
Status: DC | PRN
  Administered 2016-02-28: 6000 mL

## 2016-02-28 MED ORDER — SODIUM CHLORIDE 0.45 % IV SOLN
INTRAVENOUS | Status: DC | PRN
  Administered 2016-02-28: 12:00:00 via INTRAVENOUS

## 2016-02-28 MED ORDER — VECURONIUM BROMIDE 10 MG IV SOLR
INTRAVENOUS | Status: AC
  Filled 2016-02-28: qty 20

## 2016-02-28 MED ORDER — PANTOPRAZOLE SODIUM 40 MG PO TBEC: 40.0000 mg | DELAYED_RELEASE_TABLET | Freq: Every day | ORAL | Status: DC

## 2016-02-28 MED ORDER — PRAVASTATIN SODIUM 20 MG PO TABS
20.0000 mg | ORAL_TABLET | Freq: Every evening | ORAL | Status: DC
  Administered 2016-02-29 – 2016-03-03 (×4): 20 mg via ORAL
  Filled 2016-02-28 (×4): qty 1

## 2016-02-28 MED ORDER — DOCUSATE SODIUM 100 MG PO CAPS
200.0000 mg | ORAL_CAPSULE | Freq: Every day | ORAL | Status: DC
  Administered 2016-02-29: 200 mg via ORAL
  Filled 2016-02-28: qty 2

## 2016-02-28 MED ORDER — PROTAMINE SULFATE 10 MG/ML IV SOLN
INTRAVENOUS | Status: AC
  Filled 2016-02-28: qty 25

## 2016-02-28 MED ORDER — METOPROLOL TARTRATE 5 MG/5ML IV SOLN: 2.5000 mg | INTRAVENOUS | Status: DC | PRN

## 2016-02-28 MED ORDER — INSULIN REGULAR BOLUS VIA INFUSION
0.0000 [IU] | Freq: Three times a day (TID) | INTRAVENOUS | Status: DC
  Filled 2016-02-28: qty 10

## 2016-02-28 MED ORDER — ASPIRIN 81 MG PO CHEW: 324.0000 mg | CHEWABLE_TABLET | Freq: Every day | ORAL | Status: DC

## 2016-02-28 MED ORDER — OXYCODONE HCL 5 MG PO TABS
5.0000 mg | ORAL_TABLET | ORAL | Status: DC | PRN
  Administered 2016-02-29 – 2016-03-01 (×4): 10 mg via ORAL
  Filled 2016-02-28 (×4): qty 2

## 2016-02-28 MED ORDER — POTASSIUM CHLORIDE 10 MEQ/50ML IV SOLN
10.0000 meq | INTRAVENOUS | Status: AC
  Administered 2016-02-28 (×3): 10 meq via INTRAVENOUS

## 2016-02-28 MED ORDER — LEVALBUTEROL HCL 0.63 MG/3ML IN NEBU
0.6300 mg | INHALATION_SOLUTION | Freq: Once | RESPIRATORY_TRACT | Status: AC
  Administered 2016-02-28: 0.63 mg via RESPIRATORY_TRACT
  Filled 2016-02-28: qty 3

## 2016-02-28 MED ORDER — MIDAZOLAM HCL 5 MG/5ML IJ SOLN
INTRAMUSCULAR | Status: DC | PRN
  Administered 2016-02-28 (×3): 2 mg via INTRAVENOUS

## 2016-02-28 MED ORDER — PHENYLEPHRINE HCL 10 MG/ML IJ SOLN: 0.0000 ug/min | INTRAMUSCULAR | Status: DC

## 2016-02-28 MED ORDER — METOPROLOL TARTRATE 12.5 MG HALF TABLET
12.5000 mg | ORAL_TABLET | Freq: Two times a day (BID) | ORAL | Status: DC
Start: 2016-02-28 — End: 2016-03-01
  Administered 2016-02-29: 12.5 mg via ORAL
  Filled 2016-02-28: qty 1

## 2016-02-28 MED ORDER — CHLORHEXIDINE GLUCONATE 0.12 % MT SOLN
15.0000 mL | Freq: Once | OROMUCOSAL | Status: AC
Start: 2016-02-28 — End: 2016-02-28
  Administered 2016-02-28: 15 mL via OROMUCOSAL
  Filled 2016-02-28: qty 15

## 2016-02-28 MED ORDER — SODIUM CHLORIDE 0.9% FLUSH: 3.0000 mL | INTRAVENOUS | Status: DC | PRN

## 2016-02-28 MED ORDER — LACTATED RINGERS IV SOLN
INTRAVENOUS | Status: DC | PRN
Start: 2016-02-28 — End: 2016-02-28
  Administered 2016-02-28: 08:00:00 via INTRAVENOUS

## 2016-02-28 MED ORDER — EPHEDRINE SULFATE 50 MG/ML IJ SOLN
INTRAMUSCULAR | Status: DC | PRN
  Administered 2016-02-28: 5 mg via INTRAVENOUS
  Administered 2016-02-28: 10 mg via INTRAVENOUS

## 2016-02-28 MED ORDER — PROPOFOL 10 MG/ML IV BOLUS
INTRAVENOUS | Status: AC
  Filled 2016-02-28: qty 40

## 2016-02-28 MED ORDER — METOPROLOL TARTRATE 25 MG/10 ML ORAL SUSPENSION: 12.5000 mg | Freq: Two times a day (BID) | ORAL | Status: DC

## 2016-02-28 MED ORDER — MORPHINE SULFATE (PF) 2 MG/ML IV SOLN
2.0000 mg | INTRAVENOUS | Status: DC | PRN
  Administered 2016-02-28: 2 mg via INTRAVENOUS
  Administered 2016-02-29: 4 mg via INTRAVENOUS
  Administered 2016-02-29 (×2): 2 mg via INTRAVENOUS
  Filled 2016-02-28 (×3): qty 2
  Filled 2016-02-28: qty 1

## 2016-02-28 MED ORDER — SUFENTANIL CITRATE 50 MCG/ML IV SOLN
INTRAVENOUS | Status: DC | PRN
Start: 2016-02-28 — End: 2016-02-28
  Administered 2016-02-28 (×2): 20 ug via INTRAVENOUS
  Administered 2016-02-28: 40 ug via INTRAVENOUS
  Administered 2016-02-28: 20 ug via INTRAVENOUS
  Administered 2016-02-28: 10 ug via INTRAVENOUS
  Administered 2016-02-28: 40 ug via INTRAVENOUS
  Administered 2016-02-28 (×2): 20 ug via INTRAVENOUS

## 2016-02-28 MED ORDER — ONDANSETRON HCL 4 MG/2ML IJ SOLN: 4.0000 mg | Freq: Four times a day (QID) | INTRAMUSCULAR | Status: DC | PRN

## 2016-02-28 MED ORDER — SUFENTANIL CITRATE 250 MCG/5ML IV SOLN
INTRAVENOUS | Status: AC
  Filled 2016-02-28: qty 5

## 2016-02-28 MED ORDER — LIDOCAINE 2% (20 MG/ML) 5 ML SYRINGE
INTRAMUSCULAR | Status: DC | PRN
  Administered 2016-02-28: 100 mg via INTRAVENOUS

## 2016-02-28 MED FILL — Potassium Chloride Inj 2 mEq/ML: INTRAVENOUS | Qty: 40 | Status: AC

## 2016-02-28 MED FILL — Magnesium Sulfate Inj 50%: INTRAMUSCULAR | Qty: 10 | Status: AC

## 2016-02-28 MED FILL — Sodium Bicarbonate IV Soln 8.4%: INTRAVENOUS | Qty: 50 | Status: AC

## 2016-02-28 MED FILL — Sodium Chloride IV Soln 0.9%: INTRAVENOUS | Qty: 1000 | Status: AC

## 2016-02-28 MED FILL — Lidocaine HCl IV Inj 20 MG/ML: INTRAVENOUS | Qty: 5 | Status: AC

## 2016-02-28 MED FILL — Heparin Sodium (Porcine) Inj 1000 Unit/ML: INTRAMUSCULAR | Qty: 30 | Status: AC

## 2016-02-28 MED FILL — Mannitol IV Soln 20%: INTRAVENOUS | Qty: 500 | Status: AC

## 2016-02-28 MED FILL — Electrolyte-R (PH 7.4) Solution: INTRAVENOUS | Qty: 3000 | Status: AC

## 2016-02-28 MED FILL — Heparin Sodium (Porcine) Inj 1000 Unit/ML: INTRAMUSCULAR | Qty: 10 | Status: AC

## 2016-02-28 SURGICAL SUPPLY — 99 items
BAG DECANTER FOR FLEXI CONT (MISCELLANEOUS) ×4 IMPLANT
BANDAGE ACE 4X5 VEL STRL LF (GAUZE/BANDAGES/DRESSINGS) ×4 IMPLANT
BANDAGE ACE 6X5 VEL STRL LF (GAUZE/BANDAGES/DRESSINGS) ×4 IMPLANT
BASKET HEART  (ORDER IN 25'S) (MISCELLANEOUS) ×1
BASKET HEART (ORDER IN 25'S) (MISCELLANEOUS) ×1
BASKET HEART (ORDER IN 25S) (MISCELLANEOUS) ×2 IMPLANT
BLADE STERNUM SYSTEM 6 (BLADE) ×4 IMPLANT
BNDG GAUZE ELAST 4 BULKY (GAUZE/BANDAGES/DRESSINGS) ×4 IMPLANT
CANISTER SUCTION 2500CC (MISCELLANEOUS) ×4 IMPLANT
CATH ROBINSON RED A/P 18FR (CATHETERS) ×8 IMPLANT
CATH THORACIC 28FR (CATHETERS) ×4 IMPLANT
CATH THORACIC 36FR (CATHETERS) ×4 IMPLANT
CATH THORACIC 36FR RT ANG (CATHETERS) ×4 IMPLANT
CLIP TI MEDIUM 24 (CLIP) IMPLANT
CLIP TI WIDE RED SMALL 24 (CLIP) ×4 IMPLANT
CRADLE DONUT ADULT HEAD (MISCELLANEOUS) ×4 IMPLANT
DRAPE CARDIOVASCULAR INCISE (DRAPES) ×2
DRAPE SLUSH/WARMER DISC (DRAPES) ×4 IMPLANT
DRAPE SRG 135X102X78XABS (DRAPES) ×2 IMPLANT
DRSG COVADERM 4X14 (GAUZE/BANDAGES/DRESSINGS) ×4 IMPLANT
ELECT CAUTERY BLADE 6.4 (BLADE) ×4 IMPLANT
ELECT REM PT RETURN 9FT ADLT (ELECTROSURGICAL) ×8
ELECTRODE REM PT RTRN 9FT ADLT (ELECTROSURGICAL) ×4 IMPLANT
FELT TEFLON 1X6 (MISCELLANEOUS) ×8 IMPLANT
GAUZE SPONGE 4X4 12PLY STRL (GAUZE/BANDAGES/DRESSINGS) ×8 IMPLANT
GLOVE BIO SURGEON STRL SZ 6 (GLOVE) IMPLANT
GLOVE BIO SURGEON STRL SZ 6.5 (GLOVE) ×18 IMPLANT
GLOVE BIO SURGEON STRL SZ7 (GLOVE) IMPLANT
GLOVE BIO SURGEON STRL SZ7.5 (GLOVE) ×8 IMPLANT
GLOVE BIO SURGEONS STRL SZ 6.5 (GLOVE) ×6
GLOVE BIOGEL PI IND STRL 6 (GLOVE) IMPLANT
GLOVE BIOGEL PI IND STRL 6.5 (GLOVE) IMPLANT
GLOVE BIOGEL PI IND STRL 7.0 (GLOVE) IMPLANT
GLOVE BIOGEL PI INDICATOR 6 (GLOVE)
GLOVE BIOGEL PI INDICATOR 6.5 (GLOVE)
GLOVE BIOGEL PI INDICATOR 7.0 (GLOVE)
GLOVE EUDERMIC 7 POWDERFREE (GLOVE) ×8 IMPLANT
GLOVE ORTHO TXT STRL SZ7.5 (GLOVE) IMPLANT
GOWN STRL REUS W/ TWL LRG LVL3 (GOWN DISPOSABLE) ×12 IMPLANT
GOWN STRL REUS W/ TWL XL LVL3 (GOWN DISPOSABLE) ×2 IMPLANT
GOWN STRL REUS W/TWL LRG LVL3 (GOWN DISPOSABLE) ×12
GOWN STRL REUS W/TWL XL LVL3 (GOWN DISPOSABLE) ×2
HEMOSTAT POWDER SURGIFOAM 1G (HEMOSTASIS) ×12 IMPLANT
HEMOSTAT SURGICEL 2X14 (HEMOSTASIS) ×4 IMPLANT
INSERT FOGARTY 61MM (MISCELLANEOUS) IMPLANT
INSERT FOGARTY XLG (MISCELLANEOUS) IMPLANT
KIT BASIN OR (CUSTOM PROCEDURE TRAY) ×4 IMPLANT
KIT CATH CPB BARTLE (MISCELLANEOUS) ×4 IMPLANT
KIT ROOM TURNOVER OR (KITS) ×4 IMPLANT
KIT SUCTION CATH 14FR (SUCTIONS) ×4 IMPLANT
KIT VASOVIEW HEMOPRO VH 3000 (KITS) ×4 IMPLANT
NS IRRIG 1000ML POUR BTL (IV SOLUTION) ×24 IMPLANT
PACK OPEN HEART (CUSTOM PROCEDURE TRAY) ×4 IMPLANT
PAD ARMBOARD 7.5X6 YLW CONV (MISCELLANEOUS) ×8 IMPLANT
PAD ELECT DEFIB RADIOL ZOLL (MISCELLANEOUS) ×4 IMPLANT
PENCIL BUTTON HOLSTER BLD 10FT (ELECTRODE) ×4 IMPLANT
PUNCH AORTIC ROTATE 4.0MM (MISCELLANEOUS) IMPLANT
PUNCH AORTIC ROTATE 4.5MM 8IN (MISCELLANEOUS) ×4 IMPLANT
PUNCH AORTIC ROTATE 5MM 8IN (MISCELLANEOUS) IMPLANT
SET CARDIOPLEGIA MPS 5001102 (MISCELLANEOUS) ×4 IMPLANT
SPONGE GAUZE 4X4 12PLY STER LF (GAUZE/BANDAGES/DRESSINGS) ×8 IMPLANT
SPONGE INTESTINAL PEANUT (DISPOSABLE) IMPLANT
SPONGE LAP 18X18 X RAY DECT (DISPOSABLE) IMPLANT
SPONGE LAP 4X18 X RAY DECT (DISPOSABLE) ×4 IMPLANT
SUT BONE WAX W31G (SUTURE) ×4 IMPLANT
SUT MNCRL AB 4-0 PS2 18 (SUTURE) IMPLANT
SUT PROLENE 3 0 SH DA (SUTURE) IMPLANT
SUT PROLENE 3 0 SH1 36 (SUTURE) ×4 IMPLANT
SUT PROLENE 4 0 RB 1 (SUTURE)
SUT PROLENE 4 0 SH DA (SUTURE) IMPLANT
SUT PROLENE 4-0 RB1 .5 CRCL 36 (SUTURE) IMPLANT
SUT PROLENE 5 0 C 1 36 (SUTURE) IMPLANT
SUT PROLENE 6 0 C 1 30 (SUTURE) IMPLANT
SUT PROLENE 7 0 BV 1 (SUTURE) IMPLANT
SUT PROLENE 7 0 BV1 MDA (SUTURE) ×4 IMPLANT
SUT PROLENE 8 0 BV175 6 (SUTURE) IMPLANT
SUT SILK  1 MH (SUTURE)
SUT SILK 1 MH (SUTURE) IMPLANT
SUT STEEL STERNAL CCS#1 18IN (SUTURE) IMPLANT
SUT STEEL SZ 6 DBL 3X14 BALL (SUTURE) ×12 IMPLANT
SUT VIC AB 1 CTX 36 (SUTURE) ×4
SUT VIC AB 1 CTX36XBRD ANBCTR (SUTURE) ×4 IMPLANT
SUT VIC AB 2-0 CT1 27 (SUTURE) ×2
SUT VIC AB 2-0 CT1 TAPERPNT 27 (SUTURE) ×2 IMPLANT
SUT VIC AB 2-0 CTX 27 (SUTURE) IMPLANT
SUT VIC AB 3-0 SH 27 (SUTURE)
SUT VIC AB 3-0 SH 27X BRD (SUTURE) IMPLANT
SUT VIC AB 3-0 X1 27 (SUTURE) ×4 IMPLANT
SUT VICRYL 4-0 PS2 18IN ABS (SUTURE) IMPLANT
SUTURE E-PAK OPEN HEART (SUTURE) ×4 IMPLANT
SYSTEM SAHARA CHEST DRAIN ATS (WOUND CARE) ×4 IMPLANT
TAPE CLOTH SURG 4X10 WHT LF (GAUZE/BANDAGES/DRESSINGS) ×4 IMPLANT
TAPE PAPER 2X10 WHT MICROPORE (GAUZE/BANDAGES/DRESSINGS) ×4 IMPLANT
TOWEL OR 17X24 6PK STRL BLUE (TOWEL DISPOSABLE) ×4 IMPLANT
TOWEL OR 17X26 10 PK STRL BLUE (TOWEL DISPOSABLE) ×4 IMPLANT
TRAY FOLEY IC TEMP SENS 16FR (CATHETERS) ×4 IMPLANT
TUBING INSUFFLATION (TUBING) ×4 IMPLANT
UNDERPAD 30X30 (UNDERPADS AND DIAPERS) ×4 IMPLANT
WATER STERILE IRR 1000ML POUR (IV SOLUTION) ×8 IMPLANT

## 2016-02-28 NOTE — Anesthesia Procedure Notes (Signed)
Procedure Name: Intubation Date/Time: 02/28/2016 7:40 AM Performed by: Melina Copa, Dhamar Gregory R Pre-anesthesia Checklist: Patient identified, Emergency Drugs available, Suction available, Patient being monitored and Timeout performed Patient Re-evaluated:Patient Re-evaluated prior to inductionOxygen Delivery Method: Circle system utilized Preoxygenation: Pre-oxygenation with 100% oxygen Intubation Type: IV induction Ventilation: Mask ventilation without difficulty and Oral airway inserted - appropriate to patient size Grade View: Grade II Tube type: Oral Tube size: 8.0 mm Number of attempts: 1 Airway Equipment and Method: Stylet Placement Confirmation: ETT inserted through vocal cords under direct vision,  positive ETCO2,  CO2 detector and breath sounds checked- equal and bilateral Secured at: 23 cm Tube secured with: Tape Dental Injury: Teeth and Oropharynx as per pre-operative assessment  Comments: Placed by Collene Mares SRNA

## 2016-02-28 NOTE — Progress Notes (Signed)
  SICU Evening Rounds:   Hemodynamically stable  CI = 2.6  He failed extubation this afternoon and had to be reintubated. He was anxious and tachypneic and I thought he would be fine once the tube was removed but then he was not moving much air and belly breathing with elevated PCO2. Now sedated on Precedex.  Urine output good  CT output low  CBC    Component Value Date/Time   WBC 11.3 (H) 02/28/2016 1820   RBC 4.76 02/28/2016 1820   HGB 12.6 (L) 02/28/2016 1823   HCT 37.0 (L) 02/28/2016 1823   PLT 198 02/28/2016 1820   MCV 81.3 02/28/2016 1820   MCH 26.9 02/28/2016 1820   MCHC 33.1 02/28/2016 1820   RDW 14.0 02/28/2016 1820   LYMPHSABS 1,768 01/29/2016 1103   MONOABS 612 01/29/2016 1103   EOSABS 136 01/29/2016 1103   BASOSABS 0 01/29/2016 1103     BMET    Component Value Date/Time   NA 144 02/28/2016 1823   K 3.8 02/28/2016 1823   CL 109 02/28/2016 1823   CO2 24 02/24/2016 1350   GLUCOSE 133 (H) 02/28/2016 1823   BUN 11 02/28/2016 1823   CREATININE 0.80 02/28/2016 1823   CREATININE 1.39 (H) 01/29/2016 1103   CALCIUM 8.3 (L) 02/24/2016 1350   GFRNONAA >60 02/28/2016 1820   GFRNONAA 82 02/02/2014 1550   GFRAA >60 02/28/2016 1820   GFRAA >89 02/02/2014 1550     A/P:  Stable postop course. Continue current plans. Plan to wean to extubate early in the am. Patient ID: Thomas [name], male   DOB: Jun 24, 1950, 65 y.o.   MRN: [id number]

## 2016-02-28 NOTE — Anesthesia Procedure Notes (Signed)
Central Venous Catheter Insertion Performed by: anesthesiologist Patient location: Pre-op. Preanesthetic checklist: patient identified, IV checked, site marked, risks and benefits discussed, surgical consent, monitors and equipment checked, pre-op evaluation, timeout performed and anesthesia consent Landmarks identified PA cath was placed.Swan type and PA catheter depth:thermodilation and 53PA Cath depth:53 Procedure performed using ultrasound guided technique. Attempts: 1 Patient tolerated the procedure well with no immediate complications.

## 2016-02-28 NOTE — Anesthesia Procedure Notes (Signed)
Central Venous Catheter Insertion Performed by: anesthesiologist Patient location: Pre-op. Preanesthetic checklist: patient identified, IV checked, site marked, risks and benefits discussed, surgical consent, monitors and equipment checked, pre-op evaluation, timeout performed and anesthesia consent Lidocaine 1% used for infiltration Landmarks identified Catheter size: 8.5 Fr Sheath introducer Procedure performed using ultrasound guided technique. Attempts: 1 Following insertion, line sutured and dressing applied. Post procedure assessment: blood return through all ports, free fluid flow and no air. Patient tolerated the procedure well with no immediate complications.       

## 2016-02-28 NOTE — Interval H&P Note (Signed)
History and Physical Interval Note:  02/28/2016 6:09 AM  Thomas [name]  has presented today for surgery, with the diagnosis of CAD  The various methods of treatment have been discussed with the patient and family. After consideration of risks, benefits and other options for treatment, the patient has consented to  Procedure(s): CORONARY ARTERY BYPASS GRAFTING (CABG) (N/A) TRANSESOPHAGEAL ECHOCARDIOGRAM (TEE) (N/A) as a surgical intervention .  The patient's history has been reviewed, patient examined, no change in status, stable for surgery.  I have reviewed the patient's chart and labs.  Questions were answered to the patient's satisfaction.     Gaye Pollack

## 2016-02-28 NOTE — OR Nursing (Signed)
11:15 - 20 minute call to SICU

## 2016-02-28 NOTE — Procedures (Signed)
Extubation Procedure Note  Patient Details:   Name: Thomas [name] DOB: Feb 28, 1951 MRN: [id number]   Airway Documentation:  Airway 8 mm (Active)  Secured at (cm) 23 cm 02/28/2016  3:15 PM  Measured From Lips 02/28/2016  3:15 PM  Secured Location Right 02/28/2016  3:15 PM  Secured By Rana Snare Tape 02/28/2016  3:15 PM  Cuff Pressure (cm H2O) 26 cm H2O 02/28/2016  3:15 PM  Site Condition Dry 02/28/2016  3:15 PM     Airway 7.5 mm (Active)  Secured at (cm) 22 cm 02/28/2016 12:00 AM    Evaluation  O2 sats: stable throughout Complications: Complications of increased work of breathing & pt. was moving very little air. Placed on BIPAP & Xopenex neb given. Dr. Rebekah Chesterfield was made aware. Patient did not tolerate procedure well. Bilateral Breath Sounds: Diminished   Yes   Pt. Was extubated to a 4L [place] & immediately began to experience shortness of breath, nasal flaring & was not moving much air. Pt. Was given neb tx. & placed on BIPAP. Dr. Cyndia Bent was called & anesthesia was called to re intubate. Pt.'s parameters were as follows; VC: 500 (Dr. Cyndia Bent was made aware), NIF: -60, positive cuff leak.  Rose Hegner, Eddie North 02/28/2016, 4:07 PM

## 2016-02-28 NOTE — Op Note (Signed)
CARDIOVASCULAR SURGERY OPERATIVE NOTE  02/28/2016  Surgeon:  Gaye Pollack, MD  First Assistant: Jadene Pierini,  PA-C   Preoperative Diagnosis:  Left main and single vesel coronary artery disease   Postoperative Diagnosis:  Same   Procedure:  1. Median Sternotomy 2. Extracorporeal circulation 3.   Coronary artery bypass grafting x 2   Left internal mammary graft to the LAD  SVG to OM2   4.   Endoscopic vein harvest from the right leg   Anesthesia:  General Endotracheal   Clinical History/Surgical Indication:  The patient is a 65 year old gentleman with a history of hypertension and hyperlipidemia who has been followed by Dr. Harl Bowie for chronic exertional chest pain for the past couple years. He has a low risk nuclear stress test in 01/2014 and an echo at that time showed normal LV function with an EF of 55-60%. He continued to have symptoms and underwent a nuclear stress test in 07/2015 showing a small, moderate intensity, reversible defect in the mid to apical inferolateral myocardium. The EF ws 37%. He was offered cath at that time but decided to continue medical therapy. He has continued to have exertional chest pain when walking up an incline or mowing his grass. He has been using NTG. He decided to proceed with cath on 01/31/2016 which showed an eccentric 70-80% ostial/proximal LCX lesion in a large vessel. The LM had 50% distal tapering stenosis. LV function was normal with an LVEDP of 21.  This 65 year old gentleman presents with stable exertional angina occurring with low level activity. His nuclear stress test showed a reversible defect in the region supplied by the LCX. I have personally reviewed and interpreted his cath films and this proximal LCX lesion compromises a large territory and is close enough to the stenosed distal left main that a PCI would be risky. His symptoms are  severely limiting his lifestyle and I think CABG is the best treatment for him. I discussed the operative procedure with the patient and his wifeincluding alternatives, benefits and risks; including but not limited to bleeding, blood transfusion, infection, stroke, myocardial infarction, graft failure, heart block requiring a permanent pacemaker, organ dysfunction, and death. I answered all of their questions. [name] understands and agrees to proceed.   Preparation:  The patient was seen in the preoperative holding area and the correct patient, correct operation were confirmed with the patient after reviewing the medical record and catheterization. The consent was signed by me. Preoperative antibiotics were given. A pulmonary arterial line and radial arterial line were placed by the anesthesia team. The patient was taken back to the operating room and positioned supine on the operating room table. After being placed under general endotracheal anesthesia by the anesthesia team a foley catheter was placed. The neck, chest, abdomen, and both legs were prepped with betadine soap and solution and draped in the usual sterile manner. A surgical time-out was taken and the correct patient and operative procedure were confirmed with the nursing and anesthesia staff.   Cardiopulmonary Bypass:  A median sternotomy was performed. The pericardium was opened in the midline. Right ventricular function appeared normal. The ascending aorta was of normal size and had no palpable plaque. There were no contraindications to aortic cannulation or cross-clamping. The patient was fully systemically heparinized and the ACT was maintained > 400 sec. The proximal aortic arch was cannulated with a 20 F aortic cannula for arterial inflow. Venous cannulation was performed via the right atrial appendage  using a two-staged venous cannula. An antegrade cardioplegia/vent cannula was inserted into the mid-ascending aorta. Aortic  occlusion was performed with a single cross-clamp. Systemic cooling to 34 degrees Centigrade and topical cooling of the heart with iced saline were used. Hyperkalemic antegrade cold blood cardioplegia was used to induce diastolic arrest and was then given at about 20 minute intervals throughout the period of arrest to maintain myocardial temperature at or below 10 degrees centigrade. A temperature probe was inserted into the interventricular septum and an insulating pad was placed in the pericardium.   Left internal mammary harvest:  The left side of the sternum was retracted using the Rultract retractor. The left internal mammary artery was harvested as a pedicle graft. All side branches were clipped. It was a medium-sized vessel of good quality with excellent blood flow. It was ligated distally and divided. It was sprayed with topical papaverine solution to prevent vasospasm.   Endoscopic vein harvest:  The right greater saphenous vein was harvested endoscopically through a 2 cm incision medial to the right knee. It was harvested from the upper thigh to below the knee. It was a medium-sized vein of good quality. The side branches were all ligated with 4-0 silk ties.    Coronary arteries:  The coronary arteries were examined.   LAD:  Intramyocardial throughout the proximal and mid vessel. Visible on the surface distally where it was still a medium sized vessel with no distal disease  LCX:  OM branches were all intramyocardial. The OM2 appeared to be the largest on cath and was visible just below the epicardium. It was a large vessel in the mid portion with no disease.  RCA:  Small vessel.   Grafts:  1. LIMA to the LAD: 1.75 mm. It was sewn end to side using 8-0 prolene continuous suture. 2. SVG to OM 2:  1.75 mm. It was sewn end to side using 7-0 prolene continuous suture.   The proximal vein graft anastomoses were performed to the mid-ascending aorta using continuous 6-0 prolene  suture. Graft markers were placed around the proximal anastomoses.   Completion:  The patient was rewarmed to 37 degrees Centigrade. The clamp was removed from the LIMA pedicle and there was rapid warming of the septum and return of ventricular fibrillation. The crossclamp was removed with a time of 40 minutes. There was spontaneous return of sinus rhythm. The distal and proximal anastomoses were checked for hemostasis. The position of the grafts was satisfactory. Two temporary epicardial pacing wires were placed on the right atrium and two on the right ventricle. The patient was weaned from CPB without difficulty on no inotropes. CPB time was 57 minutes. Cardiac output was 4.2 LPM. Heparin was fully reversed with protamine and the aortic and venous cannulas removed. Hemostasis was achieved. Mediastinal and left pleural drainage tubes were placed. The sternum was closed with double #6 stainless steel wires. The fascia was closed with continuous # 1 vicryl suture. The subcutaneous tissue was closed with 2-0 vicryl continuous suture. The skin was closed with 3-0 vicryl subcuticular suture. All sponge, needle, and instrument counts were reported correct at the end of the case. Dry sterile dressings were placed over the incisions and around the chest tubes which were connected to pleurevac suction. The patient was then transported to the surgical intensive care unit in critical but stable condition.

## 2016-02-28 NOTE — Transfer of Care (Signed)
Immediate Anesthesia Transfer of Care Note  Patient: Thomas [name]  Procedure(s) Performed: Procedure(s): CORONARY ARTERY BYPASS GRAFTING x two using left internal mammary artery and right leg greater saphenous vein harvested endoscopically (N/A) TRANSESOPHAGEAL ECHOCARDIOGRAM (TEE) (N/A)  Patient Location: ICU    Anesthesia Type:General  Level of Consciousness: unresponsive and Patient remains intubated per anesthesia plan  Airway & Oxygen Therapy: Patient remains intubated per anesthesia plan and Patient placed on Ventilator (see vital sign flow sheet for setting)  Post-op Assessment: Report given to RN and Post -op Vital signs reviewed and stable  Post vital signs: Reviewed and stable  Last Vitals:  Vitals:   02/28/16 0622 02/28/16 1150  BP: (!) 175/112 95/60  Pulse: 69 82  Resp: 20 12  Temp: 36.4 C     Last Pain:  Vitals:   02/28/16 0622  TempSrc: Oral      Patients Stated Pain Goal: 1 ([id number])  Complications: No apparent anesthesia complications

## 2016-02-28 NOTE — OR Nursing (Signed)
10:45 - 45 minute call to SICU

## 2016-02-28 NOTE — Brief Op Note (Signed)
02/28/2016  10:20 AM      [address]       [place] [address]             [phone]     02/28/2016  10:20 AM  PATIENT:  Thomas [name]  65 y.o. male  PRE-OPERATIVE DIAGNOSIS:  CAD  POST-OPERATIVE DIAGNOSIS:  CAD  PROCEDURE:  Procedure(s): CORONARY ARTERY BYPASS GRAFTING x two using left internal mammary artery and right leg greater saphenous vein(EVH) TRANSESOPHAGEAL ECHOCARDIOGRAM (TEE) LIMA-LAD SVG-OM2  SURGEON:  Surgeon(s): Gaye Pollack, MD  PHYSICIAN ASSISTANT: WAYNE GOLD PA-C  ANESTHESIA:   general  PATIENT CONDITION:  ICU - intubated and hemodynamically stable.  PRE-OPERATIVE WEIGHT: 96 kg  COMPLICATIONS: NO KNOWN

## 2016-02-29 ENCOUNTER — Inpatient Hospital Stay (HOSPITAL_COMMUNITY): Payer: Medicare HMO

## 2016-02-29 ENCOUNTER — Encounter (HOSPITAL_COMMUNITY): Payer: Self-pay | Admitting: Surgery

## 2016-02-29 LAB — CBC
HEMATOCRIT: 36.7 % — AB (ref 39.0–52.0)
HEMATOCRIT: 35.2 % — AB (ref 39.0–52.0)
HEMOGLOBIN: 11.3 g/dL — AB (ref 13.0–17.0)
Hemoglobin: 12.2 g/dL — ABNORMAL LOW (ref 13.0–17.0)
MCH: 27.1 pg (ref 26.0–34.0)
MCH: 26.7 pg (ref 26.0–34.0)
MCHC: 33.2 g/dL (ref 30.0–36.0)
MCHC: 32.1 g/dL (ref 30.0–36.0)
MCV: 81.4 fL (ref 78.0–100.0)
MCV: 83.2 fL (ref 78.0–100.0)
PLATELETS: 181 10*3/uL (ref 150–400)
Platelets: 168 10*3/uL (ref 150–400)
RBC: 4.51 MIL/uL (ref 4.22–5.81)
RBC: 4.23 MIL/uL (ref 4.22–5.81)
RDW: 13.9 % (ref 11.5–15.5)
RDW: 14.7 % (ref 11.5–15.5)
WBC: 10.1 10*3/uL (ref 4.0–10.5)
WBC: 11.8 10*3/uL — AB (ref 4.0–10.5)

## 2016-02-29 LAB — BASIC METABOLIC PANEL
Anion gap: 4 — ABNORMAL LOW (ref 5–15)
BUN: 11 mg/dL (ref 6–20)
CO2: 24 mmol/L (ref 22–32)
Calcium: 7.2 mg/dL — ABNORMAL LOW (ref 8.9–10.3)
Chloride: 114 mmol/L — ABNORMAL HIGH (ref 101–111)
Creatinine, Ser: 0.82 mg/dL (ref 0.61–1.24)
GFR calc Af Amer: >60 mL/min (ref >60)
GLUCOSE: 144 mg/dL — AB (ref 65–99)
POTASSIUM: 3.5 mmol/L (ref 3.5–5.1)
Sodium: 142 mmol/L (ref 135–145)

## 2016-02-29 LAB — MAGNESIUM
MAGNESIUM: 2.2 mg/dL (ref 1.7–2.4)
Magnesium: 2.2 mg/dL (ref 1.7–2.4)

## 2016-02-29 LAB — GLUCOSE, CAPILLARY
GLUCOSE-CAPILLARY: 119 mg/dL — AB (ref 65–99)
GLUCOSE-CAPILLARY: 99 mg/dL (ref 65–99)
Glucose-Capillary: 95 mg/dL (ref 65–99)
Glucose-Capillary: 142 mg/dL — ABNORMAL HIGH (ref 65–99)
Glucose-Capillary: 141 mg/dL — ABNORMAL HIGH (ref 65–99)
Glucose-Capillary: 98 mg/dL (ref 65–99)
Glucose-Capillary: 157 mg/dL — ABNORMAL HIGH (ref 65–99)
Glucose-Capillary: 161 mg/dL — ABNORMAL HIGH (ref 65–99)
Glucose-Capillary: 115 mg/dL — ABNORMAL HIGH (ref 65–99)

## 2016-02-29 LAB — CREATININE, SERUM: Creatinine, Ser: 0.89 mg/dL (ref 0.61–1.24)

## 2016-02-29 LAB — POCT I-STAT 3, ART BLOOD GAS (G3+)
ACID-BASE DEFICIT: 1 mmol/L (ref 0.0–2.0)
BICARBONATE: 23.4 mmol/L (ref 20.0–28.0)
O2 SAT: 96 %
PCO2 ART: 39.3 mmHg (ref 32.0–48.0)
PO2 ART: 86 mmHg (ref 83.0–108.0)
Patient temperature: 37.8
TCO2: 25 mmol/L (ref 0–100)
pH, Arterial: 7.386 (ref 7.350–7.450)

## 2016-02-29 LAB — POCT I-STAT, CHEM 8
BUN: 12 mg/dL (ref 6–20)
CREATININE: 0.9 mg/dL (ref 0.61–1.24)
Calcium, Ion: 1.08 mmol/L — ABNORMAL LOW (ref 1.15–1.40)
Chloride: 104 mmol/L (ref 101–111)
GLUCOSE: 108 mg/dL — AB (ref 65–99)
HEMATOCRIT: 35 % — AB (ref 39.0–52.0)
Hemoglobin: 11.9 g/dL — ABNORMAL LOW (ref 13.0–17.0)
POTASSIUM: 3.3 mmol/L — AB (ref 3.5–5.1)
Sodium: 143 mmol/L (ref 135–145)
TCO2: 25 mmol/L (ref 0–100)

## 2016-02-29 MED ORDER — ORAL CARE MOUTH RINSE
15.0000 mL | Freq: Two times a day (BID) | OROMUCOSAL | Status: DC
  Administered 2016-02-29 – 2016-03-04 (×5): 15 mL via OROMUCOSAL

## 2016-02-29 MED ORDER — FUROSEMIDE 10 MG/ML IJ SOLN
40.0000 mg | Freq: Once | INTRAMUSCULAR | Status: AC
  Administered 2016-02-29: 40 mg via INTRAVENOUS
  Filled 2016-02-29: qty 4

## 2016-02-29 MED ORDER — INFLUENZA VAC SPLIT QUAD 0.5 ML IM SUSY
0.5000 mL | PREFILLED_SYRINGE | INTRAMUSCULAR | Status: AC
  Administered 2016-03-03: 0.5 mL via INTRAMUSCULAR
  Filled 2016-02-29: qty 0.5

## 2016-02-29 MED ORDER — INSULIN ASPART 100 UNIT/ML ~~LOC~~ SOLN: 0.0000 [IU] | SUBCUTANEOUS | Status: DC

## 2016-02-29 MED ORDER — POTASSIUM CHLORIDE 10 MEQ/50ML IV SOLN
10.0000 meq | INTRAVENOUS | Status: AC
  Administered 2016-02-29 (×3): 10 meq via INTRAVENOUS
  Filled 2016-02-29 (×3): qty 50

## 2016-02-29 MED ORDER — KETOROLAC TROMETHAMINE 15 MG/ML IJ SOLN
15.0000 mg | Freq: Four times a day (QID) | INTRAMUSCULAR | Status: DC | PRN
  Administered 2016-02-29 – 2016-03-01 (×4): 15 mg via INTRAVENOUS
  Filled 2016-02-29 (×4): qty 1

## 2016-02-29 MED ORDER — POTASSIUM CHLORIDE 10 MEQ/50ML IV SOLN
10.0000 meq | INTRAVENOUS | Status: AC | PRN
  Administered 2016-02-29 (×3): 10 meq via INTRAVENOUS
  Filled 2016-02-29 (×3): qty 50

## 2016-02-29 MED ORDER — PNEUMOCOCCAL VAC POLYVALENT 25 MCG/0.5ML IJ INJ
0.5000 mL | INJECTION | INTRAMUSCULAR | Status: AC
  Administered 2016-03-03: 0.5 mL via INTRAMUSCULAR
  Filled 2016-02-29 (×2): qty 0.5

## 2016-02-29 MED ORDER — INSULIN ASPART 100 UNIT/ML ~~LOC~~ SOLN
0.0000 [IU] | SUBCUTANEOUS | Status: DC
  Administered 2016-02-29: 2 [IU] via SUBCUTANEOUS
  Administered 2016-02-29: 4 [IU] via SUBCUTANEOUS
  Administered 2016-02-29: 2 [IU] via SUBCUTANEOUS

## 2016-02-29 MED ORDER — ENOXAPARIN SODIUM 40 MG/0.4ML ~~LOC~~ SOLN
40.0000 mg | Freq: Every day | SUBCUTANEOUS | Status: DC
  Administered 2016-02-29 – 2016-03-03 (×4): 40 mg via SUBCUTANEOUS
  Filled 2016-02-29 (×4): qty 0.4

## 2016-02-29 NOTE — Progress Notes (Signed)
Patient parameters as follows: NIF: >-60 FVC: 1.4L  Cuff leak: positive   Awaiting orders from MD

## 2016-02-29 NOTE — Progress Notes (Signed)
1 Day Post-Op Procedure(s) (LRB): CORONARY ARTERY BYPASS GRAFTING x two using left internal mammary artery and right leg greater saphenous vein harvested endoscopically (N/A) TRANSESOPHAGEAL ECHOCARDIOGRAM (TEE) (N/A) Subjective:  Just extubated. Feels much better this morning. Some soreness in chest but feels like he is breathing fine. Says he felt like he could not move any air when extubated yesterday.  Objective: Vital signs in last 24 hours: Temp:  [97 F (36.1 C)-100.2 F (37.9 C)] 100.2 F (37.9 C) (10/31 0600) Pulse Rate:  [73-90] 80 (10/31 0600) Cardiac Rhythm: A-V Sequential paced (10/31 0400) Resp:  [12-32] 15 (10/31 0600) BP: (72-130)/(53-109) 128/89 (10/31 0600) SpO2:  [89 %-100 %] 99 % (10/31 0600) Arterial Line BP: (69-125)/(45-79) 125/79 (10/30 1445) FiO2 (%):  [40 %-50 %] 40 % (10/31 0616) Weight:  [96.5 kg (212 lb 11.2 oz)-103.5 kg (228 lb 2.8 oz)] 103.5 kg (228 lb 2.8 oz) (10/31 0500)  Hemodynamic parameters for last 24 hours: PAP: (23-65)/(9-34) 31/12 CO:  [3.7 L/min-6.5 L/min] 5.8 L/min CI:  [1.7 L/min/m2-3.1 L/min/m2] 2.7 L/min/m2  Intake/Output from previous day: 10/30 0701 - 10/31 0700 In: [id number] [I.V.:4337; Blood:250; NG/GT:105; IV Piggyback:350] Out: [id number] [Urine:1730; Emesis/NG output:650; Blood:500; Chest Tube:500] Intake/Output this shift: No intake/output data recorded.  General appearance: alert and cooperative Neurologic: intact Heart: regular rate and rhythm, S1, S2 normal, no murmur, click, rub or gallop Lungs: clear to auscultation bilaterally Extremities: edema mild Wound: dressing dry  Lab Results:  Recent Labs  02/28/16 1820 02/28/16 1823 02/29/16 0350  WBC 11.3*  --  10.1  HGB 12.8* 12.6* 12.2*  HCT 38.7* 37.0* 36.7*  PLT 198  --  181   BMET:  Recent Labs  02/28/16 1823 02/29/16 0350  NA 144 142  K 3.8 3.5  CL 109 114*  CO2  --  24  GLUCOSE 133* 144*  BUN 11 11  CREATININE 0.80 0.82  CALCIUM  --  7.2*    PT/INR:   Recent Labs  02/28/16 1208  LABPROT 15.3*  INR 1.21   ABG    Component Value Date/Time   PHART 7.386 02/29/2016 0643   HCO3 23.4 02/29/2016 0643   TCO2 25 02/29/2016 0643   ACIDBASEDEF 1.0 02/29/2016 0643   O2SAT 96.0 02/29/2016 0643   CBG (last 3)   Recent Labs  02/29/16 0109 02/29/16 0204 02/29/16 0349  GLUCAP 95 99 142*   CXR: mild left base atelectasis  ECG: sinus, no acute changes  Assessment/Plan: S/P Procedure(s) (LRB): CORONARY ARTERY BYPASS GRAFTING x two using left internal mammary artery and right leg greater saphenous vein harvested endoscopically (N/A) TRANSESOPHAGEAL ECHOCARDIOGRAM (TEE) (N/A) Mobilize Diuresis d/c tubes/lines DM: preop Hgb A1c was 6.4 on metformin. Will continue SSI for now. Continue foley due to diuresing patient and patient in ICU See progression orders   LOS: 1 day    Gaye Pollack 02/29/2016

## 2016-02-29 NOTE — Progress Notes (Signed)
Rapid Wean Protocol begun at 0555. Patient is tolerating well and RT and RN will continue to monitor at bedside.

## 2016-02-29 NOTE — Care Management Note (Signed)
Case Management Note  Patient Details  Name: Thomas [name] MRN: [id number] Date of Birth: [birth year]  Subjective/Objective:  Pt lives alone but significant other lives in [place], [place] and will arrive in [place] on Saturday and plans to stay with pt as long as needed.                          Expected Discharge Plan:  Home/Self Care  Discharge planning Services  CM Consult  Status of Service:  In process, will continue to follow  Girard Cooter, RN 02/29/2016, 11:17 AM

## 2016-02-29 NOTE — Progress Notes (Signed)
Patient ID: Thomas [name], male   DOB: [birth year], 65 y.o.   MRN: [id number] EVENING ROUNDS NOTE :     [address]       [place] [address]             [phone]                 1 Day Post-Op Procedure(s) (LRB): CORONARY ARTERY BYPASS GRAFTING x two using left internal mammary artery and right leg greater saphenous vein harvested endoscopically (N/A) TRANSESOPHAGEAL ECHOCARDIOGRAM (TEE) (N/A)  Total Length of Stay:  LOS: 1 day  BP 133/87   Pulse 86   Temp 98.3 F (36.8 C) (Oral)   Resp (!) 22   Ht 5\' 9"  (1.753 m)   Wt 228 lb 2.8 oz (103.5 kg)   SpO2 97%   BMI 33.70 kg/m   .Intake/Output      10/31 0701 - 11/01 0700   P.O. 240   I.V. (mL/kg) 60 (0.6)   Blood    NG/GT    IV Piggyback 150   Total Intake(mL/kg) 450 (4.3)   Urine (mL/kg/hr) 1800 (1.4)   Emesis/NG output    Blood    Chest Tube    Total Output 1800   Net -1350         . sodium chloride Stopped (02/29/16 1000)  . sodium chloride    . sodium chloride 20 mL/hr at 02/29/16 0600  . dexmedetomidine Stopped (02/29/16 0504)  . lactated ringers 20 mL/hr at 02/29/16 0500  . lactated ringers 20 mL/hr at 02/29/16 0500  . nitroGLYCERIN 20 mcg/min (02/28/16 2100)  . phenylephrine (NEO-SYNEPHRINE) Adult infusion Stopped (02/28/16 1544)     Lab Results  Component Value Date   WBC 11.8 (H) 02/29/2016   HGB 11.9 (L) 02/29/2016   HCT 35.0 (L) 02/29/2016   PLT 168 02/29/2016   GLUCOSE 108 (H) 02/29/2016   CHOL 193 06/20/2011   TRIG 250 (H) 06/20/2011   HDL 41 06/20/2011   LDLCALC 102 (H) 06/20/2011   ALT 37 02/24/2016   AST 36 02/24/2016   NA 143 02/29/2016   K 3.3 (L) 02/29/2016   CL 104 02/29/2016   CREATININE 0.90 02/29/2016   BUN 12 02/29/2016   CO2 24 02/29/2016   TSH 1.39 08/09/2015   PSA 3.42 06/20/2011   INR 1.21 02/28/2016   HGBA1C 6.4 (H) 02/24/2016   Stable day, walked 600 feet first time up   Grace Isaac MD  Beeper [phone] Office [phone] 02/29/2016 7:25  PM

## 2016-02-29 NOTE — Procedures (Signed)
Extubation Procedure Note  Patient Details:   Name: Thomas [name] DOB: Feb 05, 1951 MRN: [id number]   Airway Documentation:     Evaluation  O2 sats: stable throughout Complications: No apparent complications Patient did tolerate procedure well. Bilateral Breath Sounds: Clear, Diminished   Yes   Pt. Was extubated to a 4L [place] without any complications, dyspnea or stridor noted. Pt. Achieved 1.4L on VC & -60 on NIF. Pt. Was instructed on IS x 10, highest goal reached was 1231mL.   Sharay Bellissimo, Eddie North 02/29/2016, 7:19 AM

## 2016-02-29 NOTE — Progress Notes (Signed)
Pt place on home cpap for the night. tol well at this time.

## 2016-02-29 NOTE — Anesthesia Postprocedure Evaluation (Signed)
Anesthesia Post Note  Patient: SHAYDEN VANGORDEN  Procedure(s) Performed: Procedure(s) (LRB): CORONARY ARTERY BYPASS GRAFTING x two using left internal mammary artery and right leg greater saphenous vein harvested endoscopically (N/A) TRANSESOPHAGEAL ECHOCARDIOGRAM (TEE) (N/A)  Patient location during evaluation: SICU Anesthesia Type: General Level of consciousness: sedated and patient remains intubated per anesthesia plan Pain management: pain level controlled Vital Signs Assessment: post-procedure vital signs reviewed and stable Respiratory status: patient remains intubated per anesthesia plan and patient on ventilator - see flowsheet for VS Cardiovascular status: stable Anesthetic complications: no    Last Vitals:  Vitals:   02/29/16 0600 02/29/16 0719  BP: 128/89 113/60  Pulse: 80 78  Resp: 15 20  Temp: 37.9 C     Last Pain:  Vitals:   02/29/16 0600  TempSrc: Core (Comment)                 Catalina Gravel

## 2016-03-01 ENCOUNTER — Inpatient Hospital Stay (HOSPITAL_COMMUNITY): Payer: Medicare HMO

## 2016-03-01 LAB — BASIC METABOLIC PANEL
ANION GAP: 6 (ref 5–15)
BUN: 12 mg/dL (ref 6–20)
CHLORIDE: 107 mmol/L (ref 101–111)
CO2: 26 mmol/L (ref 22–32)
Calcium: 7.6 mg/dL — ABNORMAL LOW (ref 8.9–10.3)
Creatinine, Ser: 0.85 mg/dL (ref 0.61–1.24)
GFR calc Af Amer: >60 mL/min (ref >60)
GLUCOSE: 116 mg/dL — AB (ref 65–99)
POTASSIUM: 3.4 mmol/L — AB (ref 3.5–5.1)
Sodium: 139 mmol/L (ref 135–145)

## 2016-03-01 LAB — GLUCOSE, CAPILLARY
GLUCOSE-CAPILLARY: 123 mg/dL — AB (ref 65–99)
GLUCOSE-CAPILLARY: 192 mg/dL — AB (ref 65–99)
Glucose-Capillary: 114 mg/dL — ABNORMAL HIGH (ref 65–99)
Glucose-Capillary: 143 mg/dL — ABNORMAL HIGH (ref 65–99)

## 2016-03-01 LAB — CBC
HEMATOCRIT: 32.6 % — AB (ref 39.0–52.0)
HEMOGLOBIN: 10.6 g/dL — AB (ref 13.0–17.0)
MCH: 26.8 pg (ref 26.0–34.0)
MCHC: 32.5 g/dL (ref 30.0–36.0)
MCV: 82.3 fL (ref 78.0–100.0)
Platelets: 170 10*3/uL (ref 150–400)
RBC: 3.96 MIL/uL — ABNORMAL LOW (ref 4.22–5.81)
RDW: 14.3 % (ref 11.5–15.5)
WBC: 11.3 10*3/uL — AB (ref 4.0–10.5)

## 2016-03-01 MED ORDER — ONDANSETRON HCL 4 MG/2ML IJ SOLN: 4.0000 mg | Freq: Four times a day (QID) | INTRAMUSCULAR | Status: DC | PRN

## 2016-03-01 MED ORDER — PANTOPRAZOLE SODIUM 40 MG PO TBEC
40.0000 mg | DELAYED_RELEASE_TABLET | Freq: Every day | ORAL | Status: DC
  Administered 2016-03-01 – 2016-03-04 (×4): 40 mg via ORAL
  Filled 2016-03-01 (×4): qty 1

## 2016-03-01 MED ORDER — BISACODYL 5 MG PO TBEC
10.0000 mg | DELAYED_RELEASE_TABLET | Freq: Every day | ORAL | Status: DC | PRN
  Administered 2016-03-02 – 2016-03-03 (×2): 10 mg via ORAL
  Filled 2016-03-01 (×2): qty 2

## 2016-03-01 MED ORDER — DOCUSATE SODIUM 100 MG PO CAPS
200.0000 mg | ORAL_CAPSULE | Freq: Every day | ORAL | Status: DC
  Administered 2016-03-01 – 2016-03-04 (×4): 200 mg via ORAL
  Filled 2016-03-01 (×4): qty 2

## 2016-03-01 MED ORDER — POTASSIUM CHLORIDE 10 MEQ/50ML IV SOLN
10.0000 meq | INTRAVENOUS | Status: DC
  Administered 2016-03-01 (×2): 10 meq via INTRAVENOUS
  Filled 2016-03-01: qty 50

## 2016-03-01 MED ORDER — FLUTICASONE PROPIONATE 50 MCG/ACT NA SUSP
2.0000 | Freq: Every day | NASAL | Status: DC
  Administered 2016-03-01 – 2016-03-03 (×2): 2 via NASAL
  Filled 2016-03-01 (×2): qty 16

## 2016-03-01 MED ORDER — SODIUM CHLORIDE 0.9% FLUSH: 3.0000 mL | INTRAVENOUS | Status: DC | PRN

## 2016-03-01 MED ORDER — OXYCODONE HCL 5 MG PO TABS
5.0000 mg | ORAL_TABLET | ORAL | Status: DC | PRN
  Administered 2016-03-01 – 2016-03-02 (×5): 10 mg via ORAL
  Administered 2016-03-02: 5 mg via ORAL
  Administered 2016-03-02 – 2016-03-04 (×7): 10 mg via ORAL
  Filled 2016-03-01 (×7): qty 2
  Filled 2016-03-01: qty 1
  Filled 2016-03-01 (×5): qty 2

## 2016-03-01 MED ORDER — SODIUM CHLORIDE 0.9% FLUSH
3.0000 mL | Freq: Two times a day (BID) | INTRAVENOUS | Status: DC
  Administered 2016-03-01 – 2016-03-02 (×2): 3 mL via INTRAVENOUS

## 2016-03-01 MED ORDER — POTASSIUM CHLORIDE CRYS ER 20 MEQ PO TBCR
20.0000 meq | EXTENDED_RELEASE_TABLET | Freq: Two times a day (BID) | ORAL | Status: AC
  Administered 2016-03-01 – 2016-03-02 (×4): 20 meq via ORAL
  Filled 2016-03-01 (×4): qty 1

## 2016-03-01 MED ORDER — ACETAMINOPHEN 325 MG PO TABS: 650.0000 mg | ORAL_TABLET | Freq: Four times a day (QID) | ORAL | Status: DC | PRN

## 2016-03-01 MED ORDER — INSULIN ASPART 100 UNIT/ML ~~LOC~~ SOLN
0.0000 [IU] | Freq: Three times a day (TID) | SUBCUTANEOUS | Status: DC
  Administered 2016-03-01: 2 [IU] via SUBCUTANEOUS
  Administered 2016-03-01: 4 [IU] via SUBCUTANEOUS
  Administered 2016-03-02 – 2016-03-03 (×3): 2 [IU] via SUBCUTANEOUS

## 2016-03-01 MED ORDER — FUROSEMIDE 40 MG PO TABS
40.0000 mg | ORAL_TABLET | Freq: Every day | ORAL | Status: AC
  Administered 2016-03-01 – 2016-03-02 (×2): 40 mg via ORAL
  Filled 2016-03-01 (×2): qty 1

## 2016-03-01 MED ORDER — METOPROLOL TARTRATE 12.5 MG HALF TABLET
12.5000 mg | ORAL_TABLET | Freq: Two times a day (BID) | ORAL | Status: DC
  Administered 2016-03-01 – 2016-03-04 (×7): 12.5 mg via ORAL
  Filled 2016-03-01 (×7): qty 1

## 2016-03-01 MED ORDER — ONDANSETRON HCL 4 MG PO TABS: 4.0000 mg | ORAL_TABLET | Freq: Four times a day (QID) | ORAL | Status: DC | PRN

## 2016-03-01 MED ORDER — MOVING RIGHT ALONG BOOK
Freq: Once | Status: AC
  Administered 2016-03-01: 1
  Filled 2016-03-01: qty 1

## 2016-03-01 MED ORDER — ASPIRIN EC 325 MG PO TBEC
325.0000 mg | DELAYED_RELEASE_TABLET | Freq: Every day | ORAL | Status: DC
  Administered 2016-03-01 – 2016-03-04 (×4): 325 mg via ORAL
  Filled 2016-03-01 (×4): qty 1

## 2016-03-01 MED ORDER — SODIUM CHLORIDE 0.9 % IV SOLN: 250.0000 mL | INTRAVENOUS | Status: DC | PRN

## 2016-03-01 MED ORDER — TRAMADOL HCL 50 MG PO TABS: 50.0000 mg | ORAL_TABLET | ORAL | Status: DC | PRN

## 2016-03-01 MED ORDER — BISACODYL 10 MG RE SUPP
10.0000 mg | Freq: Every day | RECTAL | Status: DC | PRN
  Administered 2016-03-04: 10 mg via RECTAL
  Filled 2016-03-01: qty 1

## 2016-03-01 NOTE — Progress Notes (Signed)
2 Days Post-Op Procedure(s) (LRB): CORONARY ARTERY BYPASS GRAFTING x two using left internal mammary artery and right leg greater saphenous vein harvested endoscopically (N/A) TRANSESOPHAGEAL ECHOCARDIOGRAM (TEE) (N/A) Subjective: No complaints Ambulating well Pain well-controlled Used his CPAP and slept some.  Objective: Vital signs in last 24 hours: Temp:  [98.1 F (36.7 C)-99.3 F (37.4 C)] 98.6 F (37 C) (11/01 0400) Pulse Rate:  [65-95] 85 (11/01 0700) Cardiac Rhythm: Normal sinus rhythm (11/01 0400) Resp:  [14-23] 22 (11/01 0700) BP: (90-139)/(53-87) 133/77 (11/01 0700) SpO2:  [93 %-99 %] 96 % (11/01 0700) Weight:  [99.3 kg (218 lb 14.7 oz)] 99.3 kg (218 lb 14.7 oz) (11/01 0500)  Hemodynamic parameters for last 24 hours:    Intake/Output from previous day: 10/31 0701 - 11/01 0700 In: 1100 [P.O.:640; I.V.:60; IV Piggyback:400] Out: 2760 [Urine:2760] Intake/Output this shift: No intake/output data recorded.  General appearance: alert and cooperative Neurologic: intact Heart: regular rate and rhythm, S1, S2 normal, no murmur, click, rub or gallop Lungs: clear to auscultation bilaterally Extremities: extremities normal, atraumatic, no cyanosis or edema Wound: dressing dry  Lab Results:  Recent Labs  02/29/16 1700 02/29/16 1716 03/01/16 0410  WBC 11.8*  --  11.3*  HGB 11.3* 11.9* 10.6*  HCT 35.2* 35.0* 32.6*  PLT 168  --  170   BMET:  Recent Labs  02/29/16 0350  02/29/16 1716 03/01/16 0410  NA 142  --  143 139  K 3.5  --  3.3* 3.4*  CL 114*  --  104 107  CO2 24  --   --  26  GLUCOSE 144*  --  108* 116*  BUN 11  --  12 12  CREATININE 0.82  < > 0.90 0.85  CALCIUM 7.2*  --   --  7.6*  < > = values in this interval not displayed.  PT/INR:  Recent Labs  02/28/16 1208  LABPROT 15.3*  INR 1.21   ABG    Component Value Date/Time   PHART 7.386 02/29/2016 0643   HCO3 23.4 02/29/2016 0643   TCO2 25 02/29/2016 1716   ACIDBASEDEF 1.0 02/29/2016 0643    O2SAT 96.0 02/29/2016 0643   CBG (last 3)   Recent Labs  02/29/16 1917 02/29/16 2330 03/01/16 0351  GLUCAP 161* 115* 114*   CXR: ok  Assessment/Plan: S/P Procedure(s) (LRB): CORONARY ARTERY BYPASS GRAFTING x two using left internal mammary artery and right leg greater saphenous vein harvested endoscopically (N/A) TRANSESOPHAGEAL ECHOCARDIOGRAM (TEE) (N/A)  He is hemodynamically stable in sinus rhythm and feels well Will remove sleeve and foley and transfer to 2W. Continue lopressor, add lisinopril as BP allows. Continue Pravachol Glucose under good control. Continue SSI and resume Glucophage if eating well today.   LOS: 2 days    Gaye Pollack 03/01/2016

## 2016-03-01 NOTE — Progress Notes (Signed)
Patient admitted to 2W10 A&O x4, VSS. Patient placed on tele and CCMD notified. No questions or concerns at this time. Oriented to room and call light placed within reach.

## 2016-03-02 LAB — BASIC METABOLIC PANEL
Anion gap: 6 (ref 5–15)
BUN: 12 mg/dL (ref 6–20)
CALCIUM: 8.1 mg/dL — AB (ref 8.9–10.3)
CO2: 26 mmol/L (ref 22–32)
CREATININE: 0.87 mg/dL (ref 0.61–1.24)
Chloride: 108 mmol/L (ref 101–111)
GFR calc non Af Amer: >60 mL/min (ref >60)
Glucose, Bld: 93 mg/dL (ref 65–99)
Potassium: 3.8 mmol/L (ref 3.5–5.1)
SODIUM: 140 mmol/L (ref 135–145)

## 2016-03-02 LAB — GLUCOSE, CAPILLARY
GLUCOSE-CAPILLARY: 94 mg/dL (ref 65–99)
GLUCOSE-CAPILLARY: 109 mg/dL — AB (ref 65–99)
Glucose-Capillary: 99 mg/dL (ref 65–99)
Glucose-Capillary: 129 mg/dL — ABNORMAL HIGH (ref 65–99)
Glucose-Capillary: 108 mg/dL — ABNORMAL HIGH (ref 65–99)

## 2016-03-02 LAB — CBC
HCT: 32.6 % — ABNORMAL LOW (ref 39.0–52.0)
Hemoglobin: 10.5 g/dL — ABNORMAL LOW (ref 13.0–17.0)
MCH: 26.6 pg (ref 26.0–34.0)
MCHC: 32.2 g/dL (ref 30.0–36.0)
MCV: 82.7 fL (ref 78.0–100.0)
PLATELETS: 178 10*3/uL (ref 150–400)
RBC: 3.94 MIL/uL — ABNORMAL LOW (ref 4.22–5.81)
RDW: 14.4 % (ref 11.5–15.5)
WBC: 10.1 10*3/uL (ref 4.0–10.5)

## 2016-03-02 MED ORDER — LISINOPRIL 10 MG PO TABS
10.0000 mg | ORAL_TABLET | Freq: Every day | ORAL | Status: DC
  Administered 2016-03-02: 10 mg via ORAL
  Filled 2016-03-02: qty 1

## 2016-03-02 MED ORDER — METFORMIN HCL 500 MG PO TABS
500.0000 mg | ORAL_TABLET | Freq: Two times a day (BID) | ORAL | Status: DC
  Administered 2016-03-02 – 2016-03-04 (×5): 500 mg via ORAL
  Filled 2016-03-02 (×5): qty 1

## 2016-03-02 MED ORDER — PHENOL 1.4 % MT LIQD
1.0000 | OROMUCOSAL | Status: DC | PRN
  Administered 2016-03-03 (×2): 1 via OROMUCOSAL
  Filled 2016-03-02: qty 177

## 2016-03-02 NOTE — Discharge Summary (Signed)
Physician Discharge Summary  Patient ID: Thomas [name] MRN: [id number] DOB/AGE: Jun 10, 1950 65 y.o.  Admit date: 02/28/2016 Discharge date: 03/04/2016  Admission Diagnoses:Coronary artery disease  Discharge Diagnoses:  Active Problems:   S/P CABG x 2  Patient Active Problem List   Diagnosis Date Noted  . S/P CABG x 2 02/28/2016  . Adenomatous colon polyp 03/30/2011  . GERD 07/08/2010  . GASTRITIS 07/08/2010  . BACK PAIN 03/29/2010  . ANXIETY DEPRESSION 12/02/2009  . UNSPECIFIED VITAMIN D DEFICIENCY 05/13/2009  . RENAL CYST 06/24/2008  . PARESTHESIA 06/24/2008  . OTHER SYMPTOMS INVOLVING CARDIOVASCULAR SYSTEM 06/24/2008  . CHEST PAIN, ATYPICAL 06/24/2008  . RUQ PAIN 06/24/2008  . FATIGUE 01/16/2008  . SYMPTOM, PAIN, ABDOMINAL, EPIGASTRIC 08/08/2006  . INSOMNIA NOS 02/06/2006   HPI:At time of admission  The patient is a 65 year old gentleman with a history of hypertension and hyperlipidemia who has been followed by Dr. Harl Bowie for chronic exertional chest pain for the past couple years. He has a low risk nuclear stress test in 01/2014 and an echo at that time showed normal LV function with an EF of 55-60%. He continued to have symptoms and underwent a nuclear stress test in 07/2015 showing a small, moderate intensity, reversible defect in the mid to apical inferolateral myocardium. The EF ws 37%. He was offered cath at that time but decided to continue medical therapy. He has continued to have exertional chest pain when walking up an incline or mowing his grass. He has been using NTG. He decided to proceed with cath on 01/31/2016 which showed an eccentric 70-80% ostial/proximal LCX lesion in a large vessel. The LM had 50% distal tapering stenosis. LV function was normal with an LVEDP of 21.   Discharged Condition: good  Hospital Course: The patient was taken to the operating room 02/28/2016 oriented with the below described procedure. He tolerated well was taken to surgical  intensive care unit in stable condition.   Postoperative hospital course: He tolerated well was taken to surgical intensive care unit in stable condition. He was extubated initially using standard protocols. He did however have to be reintubated as he was significantly anxious and tachypnea. He was started on Precedex for sedation. He was later extubated without significant difficulty. He did have some postoperative volume overload but is responding well to diuretics. He does have a very mild postoperative anemia which is stable. He has maintained sinus rhythm. All routine lines, monitors and drainage devices have been discontinued in the standard fashion. Incisions are healing well without evidence of infection. He is tolerating diet. He is tolerating gradually increasing activities using cardiac rehabilitation protocols. At time of discharge he is felt to be quite stable.  Consult: None  Significant Diagnostic Studies: routine labs/CXR's  Treatments: surgery:  ARDIOVASCULAR SURGERY OPERATIVE NOTE  02/28/2016  Surgeon:  Gaye Pollack, MD  First Assistant: Jadene Pierini,  PA-C   Preoperative Diagnosis:  Left main and single vesel coronary artery disease   Postoperative Diagnosis:  Same   Procedure:  1. Median Sternotomy 2. Extracorporeal circulation 3.   Coronary artery bypass grafting x 2   Left internal mammary graft to the LAD  SVG to OM2   4.   Endoscopic vein harvest from the right leg   Anesthesia:  General Endotracheal  Discharge Exam: Blood pressure (!) 162/83, pulse 77, temperature 98.3 F (36.8 C), temperature source Oral, resp. rate 20, height 5\' 9"  (1.753 m), weight 219 lb 4.8 oz (99.5 kg), SpO2 99 %.  General appearance: alert, cooperative and no distress Heart: regular rate and rhythm, S1, S2 normal, no murmur, click, rub or gallop Lungs: clear to auscultation bilaterally Abdomen: soft, non-tender; bowel sounds normal; no masses,  no  organomegaly Extremities: extremities normal, atraumatic, + edema Wound: c/d/i without issue  Disposition: 01-Home or Self Care  Discharge Instructions    Amb Referral to Cardiac Rehabilitation    Complete by:  As directed    Diagnosis:  CABG   CABG X ___:  2       Medication List    STOP taking these medications   amLODipine 5 MG tablet Commonly known as:  NORVASC   BAYER BACK & BODY PAIN EX ST 500-32.5 MG Tabs Generic drug:  Aspirin-Caffeine   nitroGLYCERIN 0.4 MG SL tablet Commonly known as:  NITROSTAT   triamterene-hydrochlorothiazide 37.5-25 MG tablet Commonly known as:  MAXZIDE-25     TAKE these medications   aspirin 325 MG EC tablet Take 1 tablet (325 mg total) by mouth daily. Start taking on:  03/05/2016 What changed:  medication strength  how much to take  when to take this   esomeprazole 20 MG capsule Commonly known as:  NEXIUM Take 20 mg by mouth daily as needed (heartburn).   furosemide 40 MG tablet Commonly known as:  LASIX Take 1 tablet (40 mg total) by mouth daily. Start taking on:  03/05/2016   GLUCOPHAGE 500 MG tablet Generic drug:  metFORMIN Take 500 mg by mouth 2 (two) times daily with a meal.   lisinopril 40 MG tablet Commonly known as:  PRINIVIL,ZESTRIL Take 1 tablet (40 mg total) by mouth daily. What changed:  when to take this   metoprolol tartrate 25 MG tablet Commonly known as:  LOPRESSOR Take 0.5 tablets (12.5 mg total) by mouth 2 (two) times daily.   mometasone 50 MCG/ACT nasal spray Commonly known as:  NASONEX Place 2 sprays into the nose daily as needed (for nasal congestion).   oxyCODONE 5 MG immediate release tablet Commonly known as:  Oxy IR/ROXICODONE Take 1 tablet (5 mg total) by mouth every 6 (six) hours as needed for severe pain.   potassium chloride SA 20 MEQ tablet Commonly known as:  K-DUR,KLOR-CON Take 1 tablet (20 mEq total) by mouth daily. Start taking on:  03/05/2016   pravastatin 20 MG  tablet Commonly known as:  PRAVACHOL Take 1 tablet (20 mg total) by mouth every evening.      Follow-up Information    Gaye Pollack, MD .   Specialty:  Cardiothoracic Surgery Why:  Appointment to see Dr. Cyndia Bent on 04/05/2016 at 10 AM. Please obtain a chest x-ray [facility] at 9:30 AM. [facility] is located in the same office complex. Contact information: [address] [place] [address] [phone]        Henry W Smith III, MD .   Specialty:  Cardiology Contact information: [phone] [address] [place] [address] [phone]          The patient has been discharged on:   1.Beta Blocker:  Yes [ x  ]                              No   [   ]  If No, reason:  2.Ace Inhibitor/ARB: Yes [ x  ]                                     No  [    ]                                     If No, reason:  3.Statin:   Yes [  x ]                  No  [   ]                  If No, reason:  4.Ecasa:  Yes  [  x ]                  No   [   ]                  If No, reason:   Signed: Elgie Collard 03/04/2016, 2:10 PM

## 2016-03-02 NOTE — Progress Notes (Signed)
[facility] [address]       [place] [address]             [phone]      3 Days Post-Op Procedure(s) (LRB): CORONARY ARTERY BYPASS GRAFTING x two using left internal mammary artery and right leg greater saphenous vein harvested endoscopically (N/A) TRANSESOPHAGEAL ECHOCARDIOGRAM (TEE) (N/A) Subjective:  Has multiple c/o primarily about hospital care. Does have sore throat.   Objective: Vital signs in last 24 hours: Temp:  [98.2 F (36.8 C)-100.3 F (37.9 C)] 98.4 F (36.9 C) (11/02 0522) Pulse Rate:  [76-135] 89 (11/02 0522) Cardiac Rhythm: Normal sinus rhythm (11/01 2100) Resp:  [18-26] 20 (11/02 0522) BP: (115-158)/(76-92) 138/88 (11/02 0633) SpO2:  [96 %-100 %] 99 % (11/02 0522) Weight:  [216 lb 3.2 oz (98.1 kg)] 216 lb 3.2 oz (98.1 kg) (11/02 0522)  Hemodynamic parameters for last 24 hours:    Intake/Output from previous day: 11/01 0701 - 11/02 0700 In: 1270 [P.O.:1220; IV Piggyback:50] Out: 200 [Urine:200] Intake/Output this shift: No intake/output data recorded.  General appearance: alert, cooperative and no distress Heart: regular rate and rhythm Lungs: coarse BS Abdomen: benign Extremities: min edema Wound: incis healing well  Lab Results:  Recent Labs  03/01/16 0410 03/02/16 0235  WBC 11.3* 10.1  HGB 10.6* 10.5*  HCT 32.6* 32.6*  PLT 170 178   BMET:  Recent Labs  03/01/16 0410 03/02/16 0235  NA 139 140  K 3.4* 3.8  CL 107 108  CO2 26 26  GLUCOSE 116* 93  BUN 12 12  CREATININE 0.85 0.87  CALCIUM 7.6* 8.1*    PT/INR:  Recent Labs  02/28/16 1208  LABPROT 15.3*  INR 1.21   ABG    Component Value Date/Time   PHART 7.386 02/29/2016 0643   HCO3 23.4 02/29/2016 0643   TCO2 25 02/29/2016 1716   ACIDBASEDEF 1.0 02/29/2016 0643   O2SAT 96.0 02/29/2016 0643   CBG (last 3)   Recent Labs  03/01/16 1209 03/01/16 2114 03/02/16 0621  GLUCAP 143* 192* 94    Meds Scheduled Meds: . aspirin EC  325 mg Oral Daily  .  docusate sodium  200 mg Oral Daily  . enoxaparin (LOVENOX) injection  40 mg Subcutaneous QHS  . fluticasone  2 spray Each Nare Daily  . furosemide  40 mg Oral Daily  . Influenza vac split quadrivalent PF  0.5 mL Intramuscular Tomorrow-1000  . insulin aspart  0-24 Units Subcutaneous TID AC & HS  . mouth rinse  15 mL Mouth Rinse BID  . metoprolol tartrate  12.5 mg Oral BID  . pantoprazole  40 mg Oral QAC breakfast  . pneumococcal 23 valent vaccine  0.5 mL Intramuscular Tomorrow-1000  . potassium chloride  20 mEq Oral BID  . pravastatin  20 mg Oral QPM  . sodium chloride flush  3 mL Intravenous Q12H   Continuous Infusions:  PRN Meds:.sodium chloride, acetaminophen, bisacodyl **OR** bisacodyl, ketorolac, ondansetron **OR** ondansetron (ZOFRAN) IV, oxyCODONE, sodium chloride flush, traMADol  Xrays Dg Chest Port 1 View  Result Date: 03/01/2016 CLINICAL DATA:  Status post CABG two days ago EXAM: PORTABLE CHEST 1 VIEW COMPARISON:  Portable chest x-ray of February 29, 2016 FINDINGS: There has been interval extubation of the trachea and esophagus. The Swan-Ganz catheter is been removed as have the chest tubes and mediastinal drain. The lungs are borderline hypoinflated. There is improved appearance of the left lower lobe. There is no pleural effusion or pneumothorax.  The cardiac silhouette remains enlarged. The pulmonary vascularity is normal. The right internal jugular Cordis sheath tip projects over the proximal SVC. The sternal wires are intact. IMPRESSION: Mild hypoinflation. Improving left lower lobe atelectasis. No pulmonary edema, pleural effusion, or pneumothorax. Electronically Signed   By: David  Martinique M.D.   On: 03/01/2016 07:58    Assessment/Plan: S/P Procedure(s) (LRB): CORONARY ARTERY BYPASS GRAFTING x two using left internal mammary artery and right leg greater saphenous vein harvested endoscopically (N/A) TRANSESOPHAGEAL ECHOCARDIOGRAM (TEE) (N/A)  1 doing well 2 add throat spray,  check throat cx, low grade temp 3 push pulm toilet/rehab as able 4 labs stable 5 sinus with some PVC's 6 add ace-I 7 cont diuretic 8 restart glucophage  LOS: 3 days    GOLD,WAYNE E 03/02/2016

## 2016-03-02 NOTE — Discharge Instructions (Signed)
Endoscopic Saphenous Vein Harvesting, Care After °Refer to this sheet in the next few weeks. These instructions provide you with information on caring for yourself after your procedure. Your health care provider may also give you more specific instructions. Your treatment has been planned according to current medical practices, but problems sometimes occur. Call your health care provider if you have any problems or questions after your procedure. °HOME CARE INSTRUCTIONS °Medicine °· Take whatever pain medicine your surgeon prescribes. Follow the directions carefully. Do not take over-the-counter pain medicine unless your surgeon says it is okay. Some pain medicine can cause bleeding problems for several weeks after surgery. °· Follow your surgeon's instructions about driving. You will probably not be permitted to drive after heart surgery. °· Take any medicines your surgeon prescribes. Any medicines you took before your heart surgery should be checked with your health care provider before you start taking them again. °Wound care °· If your surgeon has prescribed an elastic bandage or stocking, ask how long you should wear it. °· Check the area around your surgical cuts (incisions) whenever your bandages (dressings) are changed. Look for any redness or swelling. °· You will need to return to have the stitches (sutures) or staples taken out. Ask your surgeon when to do that. °· Ask your surgeon when you can shower or bathe. °Activity °· Try to keep your legs raised when you are sitting. °· Do any exercises your health care providers have given you. These may include deep breathing exercises, coughing, walking, or other exercises. °SEEK MEDICAL CARE IF: °· You have any questions about your medicines. °· You have more leg pain, especially if your pain medicine stops working. °· New or growing bruises develop on your leg. °· Your leg swells, feels tight, or becomes red. °· You have numbness in your leg. °SEEK IMMEDIATE  MEDICAL CARE IF: °· Your pain gets much worse. °· Blood or fluid leaks from any of the incisions. °· Your incisions become warm, swollen, or red. °· You have chest pain. °· You have trouble breathing. °· You have a fever. °· You have more pain near your leg incision. °MAKE SURE YOU: °· Understand these instructions. °· Will watch your condition. °· Will get help right away if you are not doing well or get worse. °  °This information is not intended to replace advice given to you by your health care provider. Make sure you discuss any questions you have with your health care provider. °  °Document Released: 12/28/2010 Document Revised: 05/08/2014 Document Reviewed: 12/28/2010 °Elsevier Interactive Patient Education ©2016 [employer] °Coronary Artery Bypass Grafting, Care After °These instructions give you information on caring for yourself after your procedure. Your doctor may also give you more specific instructions. Call your doctor if you have any problems or questions after your procedure.  °HOME CARE °· Only take medicine as told by your doctor. Take medicines exactly as told. Do not stop taking medicines or start any new medicines without talking to your doctor first. °· Take your pulse as told by your doctor. °· Do deep breathing as told by your doctor. Use your breathing device (incentive spirometer), if given, to practice deep breathing several times a day. Support your chest with a pillow or your arms when you take deep breaths or cough. °· Keep the area clean, dry, and protected where the surgery cuts (incisions) were made. Remove bandages (dressings) only as told by your doctor. If strips were applied to surgical area, do not take   them off. They fall off on their own. °· Check the surgery area daily for puffiness (swelling), redness, or leaking fluid. °· If surgery cuts were made in your legs: °· Avoid crossing your legs. °· Avoid sitting for long periods of time. Change positions every 30  minutes. °· Raise your legs when you are sitting. Place them on pillows. °· Wear stockings that help keep blood clots from forming in your legs (compression stockings). °· Only take sponge baths until your doctor says it is okay to take showers. Pat the surgery area dry. Do not rub the surgery area with a washcloth or towel. Do not bathe, swim, or use a hot tub until your doctor says it is okay. °· Eat foods that are high in fiber. These include raw fruits and vegetables, whole grains, beans, and nuts. Choose lean meats. Avoid canned, processed, and fried foods. °· Drink enough fluids to keep your pee (urine) clear or pale yellow. °· Weigh yourself every day. °· Rest and limit activity as told by your doctor. You may be told to: °· Stop any activity if you have chest pain, shortness of breath, changes in heartbeat, or dizziness. Get help right away if this happens. °· Move around often for short amounts of time or take short walks as told by your doctor. Gradually become more active. You may need help to strengthen your muscles and build endurance. °· Avoid lifting, pushing, or pulling anything heavier than 10 pounds (4.5 kg) for at least 6 weeks after surgery. °· Do not drive until your doctor says it is okay. °· Ask your doctor when you can go back to work. °· Ask your doctor when you can begin sexual activity again. °· Follow up with your doctor as told. °GET HELP IF: °· You have puffiness, redness, more pain, or fluid draining from the incision site. °· You have a fever. °· You have puffiness in your ankles or legs. °· You have pain in your legs. °· You gain 2 or more pounds (0.9 kg) a day. °· You feel sick to your stomach (nauseous) or throw up (vomit). °· You have watery poop (diarrhea). °GET HELP RIGHT AWAY IF: °· You have chest pain that goes to your jaw or arms. °· You have shortness of breath. °· You have a fast or irregular heartbeat. °· You notice a "clicking" in your breastbone when you move. °· You  have numbness or weakness in your arms or legs. °· You feel dizzy or light-headed. °MAKE SURE YOU: °· Understand these instructions. °· Will watch your condition. °· Will get help right away if you are not doing well or get worse. °  °This information is not intended to replace advice given to you by your health care provider. Make sure you discuss any questions you have with your health care provider. °  °Document Released: 04/22/2013 Document Reviewed: 04/22/2013 °Elsevier Interactive Patient Education ©2016 Elsevier Inc. ° °

## 2016-03-02 NOTE — Progress Notes (Signed)
CARDIAC REHAB PHASE I   PRE:  Rate/Rhythm: up with student    BP: sitting     SaO2:   MODE:  Ambulation: 700 ft   POST:  Rate/Rhythm: 100 ST with PVC occasionally    BP: sitting 146/82     SaO2: 97 RA  Pt up walking with student RN and I joined him. Steady, no assist needed. Doing very well, no c/o walking. To recliner. Practiced IS (1500 mL). Gave d/c video to view. This was third walk today. [place], ACSM 03/02/2016 1:36 PM

## 2016-03-03 LAB — GLUCOSE, CAPILLARY
GLUCOSE-CAPILLARY: 106 mg/dL — AB (ref 65–99)
Glucose-Capillary: 99 mg/dL (ref 65–99)
Glucose-Capillary: 127 mg/dL — ABNORMAL HIGH (ref 65–99)
Glucose-Capillary: 137 mg/dL — ABNORMAL HIGH (ref 65–99)

## 2016-03-03 MED ORDER — MAGNESIUM HYDROXIDE 400 MG/5ML PO SUSP: 30.0000 mL | Freq: Every day | ORAL | Status: DC | PRN

## 2016-03-03 MED ORDER — ALBUTEROL SULFATE (2.5 MG/3ML) 0.083% IN NEBU: 2.5000 mg | INHALATION_SOLUTION | RESPIRATORY_TRACT | Status: DC | PRN

## 2016-03-03 MED ORDER — ALBUTEROL SULFATE (2.5 MG/3ML) 0.083% IN NEBU
2.5000 mg | INHALATION_SOLUTION | Freq: Four times a day (QID) | RESPIRATORY_TRACT | Status: DC
  Administered 2016-03-03: 2.5 mg via RESPIRATORY_TRACT
  Filled 2016-03-03: qty 3

## 2016-03-03 MED ORDER — LISINOPRIL 10 MG PO TABS
20.0000 mg | ORAL_TABLET | Freq: Every day | ORAL | Status: DC
  Administered 2016-03-03 – 2016-03-04 (×2): 20 mg via ORAL
  Filled 2016-03-03 (×2): qty 2

## 2016-03-03 NOTE — Progress Notes (Signed)
Patient education completed for removal of pacing wires , patient verbalised understanding, 4 pacing wires removed as ordered, patient now  on bed rest, call  Light within reach will continue to monitor

## 2016-03-03 NOTE — Progress Notes (Addendum)
[address]       [place] [address]             [phone]      4 Days Post-Op Procedure(s) (LRB): CORONARY ARTERY BYPASS GRAFTING x two using left internal mammary artery and right leg greater saphenous vein harvested endoscopically (N/A) TRANSESOPHAGEAL ECHOCARDIOGRAM (TEE) (N/A) Subjective: Sore, some productive sputum, no BM, throat still sore but better  Objective: Vital signs in last 24 hours: Temp:  [98.2 F (36.8 C)-98.3 F (36.8 C)] 98.3 F (36.8 C) (11/03 0435) Pulse Rate:  [75-84] 79 (11/03 0435) Cardiac Rhythm: Normal sinus rhythm (11/02 1900) Resp:  [18] 18 (11/03 0435) BP: (123-131)/(73-82) 131/82 (11/03 0435) SpO2:  [94 %-98 %] 94 % (11/03 0435) Weight:  [214 lb 14.4 oz (97.5 kg)] 214 lb 14.4 oz (97.5 kg) (11/03 0435)  Hemodynamic parameters for last 24 hours:    Intake/Output from previous day: 11/02 0701 - 11/03 0700 In: 720 [P.O.:720] Out: 250 [Urine:250] Intake/Output this shift: No intake/output data recorded.  General appearance: alert, cooperative and no distress Heart: regular rate and rhythm and occ extrasystole Lungs: scattered wheeze Abdomen: mod dist, + BS, non-tender Extremities: + minor edema LE's Wound: incis healing well  Lab Results:  Recent Labs  03/01/16 0410 03/02/16 0235  WBC 11.3* 10.1  HGB 10.6* 10.5*  HCT 32.6* 32.6*  PLT 170 178   BMET:  Recent Labs  03/01/16 0410 03/02/16 0235  NA 139 140  K 3.4* 3.8  CL 107 108  CO2 26 26  GLUCOSE 116* 93  BUN 12 12  CREATININE 0.85 0.87  CALCIUM 7.6* 8.1*    PT/INR: No results for input(s): LABPROT, INR in the last 72 hours. ABG    Component Value Date/Time   PHART 7.386 02/29/2016 0643   HCO3 23.4 02/29/2016 0643   TCO2 25 02/29/2016 1716   ACIDBASEDEF 1.0 02/29/2016 0643   O2SAT 96.0 02/29/2016 0643   CBG (last 3)   Recent Labs  03/02/16 1101 03/02/16 1626 03/02/16 2112  GLUCAP 109* 129* 108*    Meds Scheduled Meds: .  aspirin EC  325 mg Oral Daily  . docusate sodium  200 mg Oral Daily  . enoxaparin (LOVENOX) injection  40 mg Subcutaneous QHS  . fluticasone  2 spray Each Nare Daily  . Influenza vac split quadrivalent PF  0.5 mL Intramuscular Tomorrow-1000  . insulin aspart  0-24 Units Subcutaneous TID AC & HS  . lisinopril  10 mg Oral Daily  . mouth rinse  15 mL Mouth Rinse BID  . metFORMIN  500 mg Oral BID WC  . metoprolol tartrate  12.5 mg Oral BID  . pantoprazole  40 mg Oral QAC breakfast  . pneumococcal 23 valent vaccine  0.5 mL Intramuscular Tomorrow-1000  . pravastatin  20 mg Oral QPM  . sodium chloride flush  3 mL Intravenous Q12H   Continuous Infusions:  PRN Meds:.sodium chloride, acetaminophen, bisacodyl **OR** bisacodyl, ketorolac, ondansetron **OR** ondansetron (ZOFRAN) IV, oxyCODONE, phenol, sodium chloride flush, traMADol  Xrays No results found.  Assessment/Plan: S/P Procedure(s) (LRB): CORONARY ARTERY BYPASS GRAFTING x two using left internal mammary artery and right leg greater saphenous vein harvested endoscopically (N/A) TRANSESOPHAGEAL ECHOCARDIOGRAM (TEE) (N/A)  1 progressing well  2 d/c epw's 3 throat cx pending 4 Add albuterol 5 labs stable 6 increase lisinopril a little 7 push rehab /pulm toilet as able 8 laxative  LOS: 4 days    Thomas [name],Thomas [name] 03/03/2016  Chart reviewed, patient examined, agree with above. He continues to improve and says he is ready to go home tomorrow.

## 2016-03-03 NOTE — Progress Notes (Signed)
Pt is walking independently without difficulty. Ed completed with focus on DM diet (did not receive ed when first diagnosed). Will send CRPII to [facility].  [place], ACSM 10:34 AM 03/03/2016

## 2016-03-03 NOTE — Care Management Important Message (Signed)
Important Message  Patient Details  Name: Thomas [name] MRN: [id number] Date of Birth: [birth year]   Medicare Important Message Given:  Yes    Nathen May 03/03/2016, 10:24 AM

## 2016-03-04 LAB — GLUCOSE, CAPILLARY
GLUCOSE-CAPILLARY: 98 mg/dL (ref 65–99)
Glucose-Capillary: 93 mg/dL (ref 65–99)

## 2016-03-04 LAB — CULTURE, GROUP A STREP (THRC)

## 2016-03-04 MED ORDER — FUROSEMIDE 40 MG PO TABS
40.0000 mg | ORAL_TABLET | Freq: Every day | ORAL | Status: DC
  Administered 2016-03-04: 40 mg via ORAL
  Filled 2016-03-04: qty 1

## 2016-03-04 MED ORDER — LISINOPRIL 20 MG PO TABS: 20.0000 mg | ORAL_TABLET | Freq: Every day | ORAL | 1 refills | Status: DC

## 2016-03-04 MED ORDER — OXYCODONE HCL 5 MG PO TABS: 5.0000 mg | ORAL_TABLET | Freq: Four times a day (QID) | ORAL | 0 refills | Status: DC | PRN

## 2016-03-04 MED ORDER — POTASSIUM CHLORIDE CRYS ER 20 MEQ PO TBCR
20.0000 meq | EXTENDED_RELEASE_TABLET | Freq: Every day | ORAL | 0 refills | Status: DC
Start: 2016-03-05 — End: 2016-06-23

## 2016-03-04 MED ORDER — POLYETHYLENE GLYCOL 3350 17 G PO PACK
17.0000 g | PACK | Freq: Every day | ORAL | Status: DC | PRN
  Administered 2016-03-04: 17 g via ORAL
  Filled 2016-03-04: qty 1

## 2016-03-04 MED ORDER — METOPROLOL TARTRATE 25 MG PO TABS
12.5000 mg | ORAL_TABLET | Freq: Two times a day (BID) | ORAL | 1 refills | Status: DC
Start: 2016-03-04 — End: 2017-05-24

## 2016-03-04 MED ORDER — FUROSEMIDE 40 MG PO TABS: 40.0000 mg | ORAL_TABLET | Freq: Every day | ORAL | 0 refills | Status: DC

## 2016-03-04 MED ORDER — ASPIRIN 325 MG PO TBEC: 325.0000 mg | DELAYED_RELEASE_TABLET | Freq: Every day | ORAL | 0 refills | Status: DC

## 2016-03-04 MED ORDER — POTASSIUM CHLORIDE CRYS ER 20 MEQ PO TBCR
20.0000 meq | EXTENDED_RELEASE_TABLET | Freq: Every day | ORAL | Status: DC
  Administered 2016-03-04: 20 meq via ORAL
  Filled 2016-03-04: qty 1

## 2016-03-04 NOTE — Progress Notes (Addendum)
      [address]       [place] [address]             [phone]      5 Days Post-Op Procedure(s) (LRB): CORONARY ARTERY BYPASS GRAFTING x two using left internal mammary artery and right leg greater saphenous vein harvested endoscopically (N/A) TRANSESOPHAGEAL ECHOCARDIOGRAM (TEE) (N/A) Subjective:  Feels good today. Asking to go home.  Objective: Vital signs in last 24 hours: Temp:  [98 F (36.7 C)-99.5 F (37.5 C)] 99.5 F (37.5 C) (11/04 0500) Pulse Rate:  [75-82] 75 (11/04 0500) Cardiac Rhythm: Normal sinus rhythm (11/03 2030) Resp:  [18-20] 20 (11/04 0500) BP: (130-143)/(69-87) 130/78 (11/04 0500) SpO2:  [96 %-99 %] 97 % (11/04 0500) Weight:  [219 lb 4.8 oz (99.5 kg)] 219 lb 4.8 oz (99.5 kg) (11/04 0500)  Hemodynamic parameters for last 24 hours:    Intake/Output from previous day: 11/03 0701 - 11/04 0700 In: 960 [P.O.:960] Out: -  Intake/Output this shift: No intake/output data recorded.  General appearance: alert, cooperative and no distress Heart: regular rate and rhythm, S1, S2 normal, no murmur, click, rub or gallop Lungs: clear to auscultation bilaterally Abdomen: soft, non-tender; bowel sounds normal; no masses,  no organomegaly Extremities: extremities normal, atraumatic, + edema Wound: c/d/i without issue  Lab Results:  Recent Labs  03/02/16 0235  WBC 10.1  HGB 10.5*  HCT 32.6*  PLT 178   BMET:  Recent Labs  03/02/16 0235  NA 140  K 3.8  CL 108  CO2 26  GLUCOSE 93  BUN 12  CREATININE 0.87  CALCIUM 8.1*    PT/INR: No results for input(s): LABPROT, INR in the last 72 hours. ABG    Component Value Date/Time   PHART 7.386 02/29/2016 0643   HCO3 23.4 02/29/2016 0643   TCO2 25 02/29/2016 1716   ACIDBASEDEF 1.0 02/29/2016 0643   O2SAT 96.0 02/29/2016 0643   CBG (last 3)   Recent Labs  03/03/16 1634 03/03/16 2102 03/04/16 0602  GLUCAP 106* 137* 98    Assessment/Plan: S/P Procedure(s) (LRB): CORONARY  ARTERY BYPASS GRAFTING x two using left internal mammary artery and right leg greater saphenous vein harvested endoscopically (N/A) TRANSESOPHAGEAL ECHOCARDIOGRAM (TEE) (N/A)  1. S/p CABG. NSR 70s. BP well controlled. ASA, statin, Bb, ACEI.  2. pulm-tolerating room air with good oxygen saturation. Encouraged IS and flutter valve use. 3. Renal-weight up a few pounds today. Not much urine output recorded. Last creatinine stable. Will give Lasix 40mg  today with potassium 4. Endocrine-blood glucose well controlled 5. ID-no fever for several days. Throat culture negative. Last WBC 10.1  Plan: Weight up a few pounds. Added diuretic. Home soon.    LOS: 5 days    Elgie Collard 03/04/2016  Chart reviewed, patient examined, agree with above. He is doing well, walking without oxygen. Wt is about 7 lbs over preop but who knows if this is accurate since it jumped up 5 lbs from yesterday. I/O inaccurate. I think he can go home today on Lasix 40 mg daily for 7 days and KCL 40 meg daily.

## 2016-03-04 NOTE — Progress Notes (Signed)
Order to discharge received, telemetry removed & CCMD notifed.  No IV access to be removed.  CT sutures to be removed at f/u appt per Dr. Cyndia Bent.

## 2016-03-08 ENCOUNTER — Other Ambulatory Visit: Payer: Self-pay | Admitting: *Deleted

## 2016-03-08 DIAGNOSIS — G8918 Other acute postprocedural pain: Secondary | ICD-10-CM

## 2016-03-08 MED ORDER — OXYCODONE HCL 5 MG PO TABS: 5.0000 mg | ORAL_TABLET | ORAL | 0 refills | Status: DC | PRN

## 2016-03-09 ENCOUNTER — Ambulatory Visit (INDEPENDENT_AMBULATORY_CARE_PROVIDER_SITE_OTHER): Payer: Self-pay | Admitting: *Deleted

## 2016-03-09 DIAGNOSIS — I2510 Atherosclerotic heart disease of native coronary artery without angina pectoris: Secondary | ICD-10-CM

## 2016-03-09 DIAGNOSIS — Z951 Presence of aortocoronary bypass graft: Secondary | ICD-10-CM

## 2016-03-09 DIAGNOSIS — Z4802 Encounter for removal of sutures: Secondary | ICD-10-CM

## 2016-03-10 NOTE — Progress Notes (Signed)
Mr. Fritch returns s/p CABG X  2 for the removal of sutures from 3 previous chest tube sites. This was easily done. These sites as well as the sternal incision and right leg EVH incision are all very well healed. He relates no issues with diet or bowels. He does request a refill for Oxycodone and a new script was provided from his surgeon, Dr. Cyndia Bent. He will return as scheduled with a CXR.

## 2016-03-17 ENCOUNTER — Ambulatory Visit (INDEPENDENT_AMBULATORY_CARE_PROVIDER_SITE_OTHER): Payer: Medicare HMO | Admitting: Adult Health

## 2016-03-17 ENCOUNTER — Encounter: Payer: Self-pay | Admitting: Adult Health

## 2016-03-17 VITALS — BP 136/76 | HR 77 | Ht 69.5 in | Wt 209.0 lb

## 2016-03-17 DIAGNOSIS — I10 Essential (primary) hypertension: Secondary | ICD-10-CM | POA: Diagnosis not present

## 2016-03-17 DIAGNOSIS — K219 Gastro-esophageal reflux disease without esophagitis: Secondary | ICD-10-CM

## 2016-03-17 DIAGNOSIS — I2510 Atherosclerotic heart disease of native coronary artery without angina pectoris: Secondary | ICD-10-CM

## 2016-03-17 DIAGNOSIS — Z951 Presence of aortocoronary bypass graft: Secondary | ICD-10-CM

## 2016-03-17 MED ORDER — ESOMEPRAZOLE MAGNESIUM 20 MG PO CPDR: 20.0000 mg | DELAYED_RELEASE_CAPSULE | Freq: Every day | ORAL | 3 refills | Status: DC | PRN

## 2016-03-17 NOTE — Patient Instructions (Signed)
Medication Instructions:  Take metoprolol 12.5 mg - two times daily ( 1/2 tablet, twice daily)   Labwork: none  Testing/Procedures: You have been referred to - Cardiac Rehab    Follow-Up: Your physician recommends that you schedule a follow-up appointment in: 3 months    Any Other Special Instructions Will Be Listed Below (If Applicable).     If you need a refill on your cardiac medications before your next appointment, please call your pharmacy.

## 2016-03-17 NOTE — Progress Notes (Signed)
Name: Thomas [name]    DOB: 04/06/1951  Age: 65 y.o.  MR#: [id number]       PCP:  Wende Neighbors, MD      Insurance: Payor: [employer] MEDICARE / Plan: [employer] MEDICARE HMO/PPO / Product Type: *No Product type* /   CC:   No chief complaint on file.   VS Vitals:   03/17/16 1314  Pulse: 77  SpO2: 96%  Weight: 209 lb (94.8 kg)  Height: 5' 9.5" (1.765 m)    Weights Current Weight  03/17/16 209 lb (94.8 kg)  03/04/16 219 lb 4.8 oz (99.5 kg)  02/24/16 212 lb 11.2 oz (96.5 kg)    Blood Pressure  BP Readings from Last 3 Encounters:  03/04/16 (!) 162/83  02/24/16 (!) 155/80  02/02/16 (!) 151/91     Admit date:  (Not on file) Last encounter with RMR:  01/25/2016   Allergy Other and Simvastatin  Current Outpatient Prescriptions  Medication Sig Dispense Refill  . aspirin EC 325 MG EC tablet Take 1 tablet (325 mg total) by mouth daily. 30 tablet 0  . esomeprazole (NEXIUM) 20 MG capsule Take 20 mg by mouth daily as needed (heartburn).    Marland Kitchen lisinopril (PRINIVIL,ZESTRIL) 40 MG tablet Take 1 tablet (40 mg total) by mouth daily. (Patient taking differently: Take 40 mg by mouth every evening. ) 90 tablet 3  . metFORMIN (GLUCOPHAGE) 500 MG tablet Take 500 mg by mouth 2 (two) times daily with a meal.    . metoprolol tartrate (LOPRESSOR) 25 MG tablet Take 0.5 tablets (12.5 mg total) by mouth 2 (two) times daily. 30 tablet 1  . mometasone (NASONEX) 50 MCG/ACT nasal spray Place 2 sprays into the nose daily as needed (for nasal congestion).    . pravastatin (PRAVACHOL) 20 MG tablet Take 1 tablet (20 mg total) by mouth every evening. 90 tablet 3  . furosemide (LASIX) 40 MG tablet Take 1 tablet (40 mg total) by mouth daily. 7 tablet 0  . potassium chloride SA (K-DUR,KLOR-CON) 20 MEQ tablet Take 1 tablet (20 mEq total) by mouth daily. 7 tablet 0   No current facility-administered medications for this visit.     Discontinued Meds:    Medications Discontinued During This Encounter  Medication Reason  .  oxyCODONE (OXY IR/ROXICODONE) 5 MG immediate release tablet Error    Patient Active Problem List   Diagnosis Date Noted  . S/P CABG x 2 02/28/2016  . Adenomatous colon polyp 03/30/2011  . GERD 07/08/2010  . GASTRITIS 07/08/2010  . BACK PAIN 03/29/2010  . ANXIETY DEPRESSION 12/02/2009  . UNSPECIFIED VITAMIN D DEFICIENCY 05/13/2009  . RENAL CYST 06/24/2008  . PARESTHESIA 06/24/2008  . OTHER SYMPTOMS INVOLVING CARDIOVASCULAR SYSTEM 06/24/2008  . CHEST PAIN, ATYPICAL 06/24/2008  . RUQ PAIN 06/24/2008  . FATIGUE 01/16/2008  . SYMPTOM, PAIN, ABDOMINAL, EPIGASTRIC 08/08/2006  . INSOMNIA NOS 02/06/2006    LABS    Component Value Date/Time   NA 140 03/02/2016 0235   NA 139 03/01/2016 0410   NA 143 02/29/2016 1716   K 3.8 03/02/2016 0235   K 3.4 (L) 03/01/2016 0410   K 3.3 (L) 02/29/2016 1716   CL 108 03/02/2016 0235   CL 107 03/01/2016 0410   CL 104 02/29/2016 1716   CO2 26 03/02/2016 0235   CO2 26 03/01/2016 0410   CO2 24 02/29/2016 0350   GLUCOSE 93 03/02/2016 0235   GLUCOSE 116 (H) 03/01/2016 0410   GLUCOSE 108 (H) 02/29/2016 1716   BUN  12 03/02/2016 0235   BUN 12 03/01/2016 0410   BUN 12 02/29/2016 1716   CREATININE 0.87 03/02/2016 0235   CREATININE 0.85 03/01/2016 0410   CREATININE 0.90 02/29/2016 1716   CREATININE 1.39 (H) 01/29/2016 1103   CREATININE 0.96 08/09/2015 1310   CREATININE 0.97 02/02/2014 1550   CALCIUM 8.1 (L) 03/02/2016 0235   CALCIUM 7.6 (L) 03/01/2016 0410   CALCIUM 7.2 (L) 02/29/2016 0350   GFRNONAA >60 03/02/2016 0235   GFRNONAA >60 03/01/2016 0410   GFRNONAA >60 02/29/2016 1700   GFRNONAA 82 02/02/2014 1550   GFRNONAA >89 06/20/2011 1106   GFRAA >60 03/02/2016 0235   GFRAA >60 03/01/2016 0410   GFRAA >60 02/29/2016 1700   GFRAA >89 02/02/2014 1550   GFRAA >89 06/20/2011 1106   CMP     Component Value Date/Time   NA 140 03/02/2016 0235   K 3.8 03/02/2016 0235   CL 108 03/02/2016 0235   CO2 26 03/02/2016 0235   GLUCOSE 93 03/02/2016  0235   BUN 12 03/02/2016 0235   CREATININE 0.87 03/02/2016 0235   CREATININE 1.39 (H) 01/29/2016 1103   CALCIUM 8.1 (L) 03/02/2016 0235   PROT 6.5 02/24/2016 1350   ALBUMIN 3.9 02/24/2016 1350   AST 36 02/24/2016 1350   ALT 37 02/24/2016 1350   ALKPHOS 45 02/24/2016 1350   BILITOT 0.7 02/24/2016 1350   GFRNONAA >60 03/02/2016 0235   GFRNONAA 82 02/02/2014 1550   GFRAA >60 03/02/2016 0235   GFRAA >89 02/02/2014 1550       Component Value Date/Time   WBC 10.1 03/02/2016 0235   WBC 11.3 (H) 03/01/2016 0410   WBC 11.8 (H) 02/29/2016 1700   HGB 10.5 (L) 03/02/2016 0235   HGB 10.6 (L) 03/01/2016 0410   HGB 11.9 (L) 02/29/2016 1716   HCT 32.6 (L) 03/02/2016 0235   HCT 32.6 (L) 03/01/2016 0410   HCT 35.0 (L) 02/29/2016 1716   MCV 82.7 03/02/2016 0235   MCV 82.3 03/01/2016 0410   MCV 83.2 02/29/2016 1700    Lipid Panel     Component Value Date/Time   CHOL 193 06/20/2011 1106   TRIG 250 (H) 06/20/2011 1106   HDL 41 06/20/2011 1106   CHOLHDL 4.7 06/20/2011 1106   VLDL 50 (H) 06/20/2011 1106   LDLCALC 102 (H) 06/20/2011 1106    ABG    Component Value Date/Time   PHART 7.386 02/29/2016 0643   PCO2ART 39.3 02/29/2016 0643   PO2ART 86.0 02/29/2016 0643   HCO3 23.4 02/29/2016 0643   TCO2 25 02/29/2016 1716   ACIDBASEDEF 1.0 02/29/2016 0643   O2SAT 96.0 02/29/2016 0643     Lab Results  Component Value Date   TSH 1.39 08/09/2015   BNP (last 3 results) No results for input(s): BNP in the last 8760 hours.  ProBNP (last 3 results) No results for input(s): PROBNP in the last 8760 hours.  Cardiac Panel (last 3 results) No results for input(s): CKTOTAL, CKMB, TROPONINI, RELINDX in the last 72 hours.  Iron/TIBC/Ferritin/ %Sat    Component Value Date/Time   IRON 66 01/16/2009 0001     EKG Orders placed or performed in visit on 03/17/16  . EKG 12-Lead     Prior Assessment and Plan Problem List as of 03/17/2016 Reviewed: 02/28/2016  6:44 AM by Moshe Salisbury, CRNA      Digestive   GERD   Last Assessment & Plan 03/06/2012 Office Visit Written 03/08/2012  9:11 AM by Mahala Menghini, PA  Continue Nexium 40 mg daily. Typical reflux symptoms well controlled.      GASTRITIS   Adenomatous colon polyp     Genitourinary   RENAL CYST     Other   UNSPECIFIED VITAMIN D DEFICIENCY   ANXIETY DEPRESSION   BACK PAIN   Last Assessment & Plan 03/30/2011 Office Visit Written 03/30/2011  1:10 PM by Andria Meuse, NP    Follow up with her primary care provider for this      INSOMNIA NOS   FATIGUE   PARESTHESIA   OTHER SYMPTOMS INVOLVING CARDIOVASCULAR SYSTEM   CHEST PAIN, ATYPICAL   RUQ PAIN   Last Assessment & Plan 03/06/2012 Office Visit Edited 03/08/2012 10:23 AM by Mahala Menghini, PA    Intermittent right upper quadrant pain, persistent now for the past 2 weeks however. Better at this point then when it first started again. Etiology not well-defined. Workup of small bowel abnormality as outlined above. MRE abdomen was okay. Offered patient repeat CT abdomen to make sure no recurrent small bowel issues such as wall thickening, tumor. At this point the patient is not interested in further imaging. He'll let us know if symptoms do not settle down and he prefers to pursue further workup.  Labs ordered.       SYMPTOM, PAIN, ABDOMINAL, EPIGASTRIC   S/P CABG x 2       Imaging: Dg Chest 2 View  Result Date: 02/24/2016 CLINICAL DATA:  Pre cardiac bypass surgery EXAM: CHEST  2 VIEW COMPARISON:  January 25, 2016 FINDINGS: The heart size and mediastinal contours are stable. The heart size is enlarged. The aorta is tortuous. Both lungs are clear. The visualized skeletal structures are unremarkable. IMPRESSION: No active cardiopulmonary disease. Electronically Signed   By: Abelardo Diesel M.D.   On: 02/24/2016 16:09   Dg Chest Port 1 View  Result Date: 03/01/2016 CLINICAL DATA:  Status post CABG two days ago EXAM: PORTABLE CHEST 1 VIEW COMPARISON:  Portable  chest x-ray of February 29, 2016 FINDINGS: There has been interval extubation of the trachea and esophagus. The Swan-Ganz catheter is been removed as have the chest tubes and mediastinal drain. The lungs are borderline hypoinflated. There is improved appearance of the left lower lobe. There is no pleural effusion or pneumothorax. The cardiac silhouette remains enlarged. The pulmonary vascularity is normal. The right internal jugular Cordis sheath tip projects over the proximal SVC. The sternal wires are intact. IMPRESSION: Mild hypoinflation. Improving left lower lobe atelectasis. No pulmonary edema, pleural effusion, or pneumothorax. Electronically Signed   By: David  Martinique M.D.   On: 03/01/2016 07:58   Dg Chest Port 1 View  Result Date: 02/29/2016 CLINICAL DATA:  Postop. EXAM: PORTABLE CHEST 1 VIEW COMPARISON:  02/28/2016 FINDINGS: Endotracheal tube, Swan-Ganz catheter, left chest tube and NG tube remain in place, unchanged. Low lung volumes with vascular crowding and areas of atelectasis bilaterally. Mild cardiomegaly. No visible pneumothorax. IMPRESSION: Low lung volumes with areas of atelectasis and vascular crowding. Mild cardiomegaly. Electronically Signed   By: Rolm Baptise M.D.   On: 02/29/2016 08:35   Dg Chest Port 1 View  Result Date: 02/28/2016 CLINICAL DATA:  Re-intubation EXAM: PORTABLE CHEST 1 VIEW COMPARISON:  Portable exam 1734 hours compared to 1231 hours FINDINGS: Tip of endotracheal tube projects approximately 2.5 cm above carina. Nasogastric tube extends into stomach. RIGHT jugular Swan-Ganz catheter with tip projecting over descending interlobar pulmonary artery. Mediastinal drain and LEFT thoracostomy tube identified. Enlargement of cardiac silhouette post  median sternotomy. Persistent LEFT basilar infiltrate. No definite pleural effusion or pneumothorax field IMPRESSION: Tip of endotracheal tube projects approximately 2.5 cm above carina. Low lung volumes with persistent LEFT  basilar infiltrate. Electronically Signed   By: Lavonia Dana M.D.   On: 02/28/2016 17:46   Dg Chest Port 1 View  Result Date: 02/28/2016 CLINICAL DATA:  Postoperative day 0 status post CABG. EXAM: PORTABLE CHEST 1 VIEW COMPARISON:  02/24/2016 FINDINGS: Endotracheal tube placed, tip about 7 mm above the carina, consider retracting 2 cm. Nasogastric tube enters the stomach. Mediastinal drain and left-sided chest tube in place. Swan-Ganz catheter noted with tip in the right descending pulmonary artery. Low lung volumes. Indistinct left lower lobe airspace opacity in the retrocardiac position. Continued mild cardiomegaly. No overt edema. No visible pneumothorax. Tortuous thoracic aorta. IMPRESSION: 1. Endotracheal tube is somewhat low in position, only about 7 mm above the carina. Consider retracting 2 cm. 2. The remainder of the support apparatus appear satisfactorily positioned. 3. Continued mild cardiomegaly.  Low lung volumes. 4. Indistinct left lower lobe airspace opacity in the retrocardiac position is nonspecific but probably from atelectasis given the crowding of bronchi. Surveillance suggested. These results will be called to the ordering clinician or representative by the Radiologist Assistant, and communication documented in the PACS or zVision Dashboard. Electronically Signed   By: Van Clines M.D.   On: 02/28/2016 12:45

## 2016-03-17 NOTE — Progress Notes (Signed)
Cardiology Office Note   Date:  03/17/2016   ID:  Thomas [name], Thomas [name] 10-Nov-1950, MRN [id number]  PCP:  Wende Neighbors, MD  Cardiologist: Cloria Spring, NP   Chief Complaint  Patient presents with  . Coronary Artery Disease    S/P CABG  . Hypertension      History of Present Illness: Thomas [name] is a 65 y.o. male who presents for ongoing assessment and management of CAD, bradycardia, and hypertension. The patient was last seen in the office with recurrent chest pain. The patient was sent for cardiac catheterization on 01/31/2016. This revealed multivessel coronary artery disease: Conclusion     Ost Cx lesion, 75 %stenosed.  LM lesion, 50 %stenosed.  The left ventricular systolic function is normal.  The left ventricular ejection fraction is 50-55% by visual estimate.  LV end diastolic pressure is mildly elevated.    Eccentric 70-80% proximal/ostial circumflex. Circumflex territory is large.  50% stenosis in the distal left main.  Codominant right coronary  Overall normal LV function with mild elevation and end-diastolic pressure.   RECOMMENDATIONS:   Optimize medical therapy, although the patient is unable to use statin therapy due to side effects. Will add low-dose beta blocker therapy. Beta-blockade dosing will be compromised by relatively slow resting heart rate.  Consider CABG versus higher risk PCI of the ostial circumflex in the setting of distal left main disease..   This was followed by coronary artery bypass grafting 2, with left internal mammary artery to LAD and SVG to OM 2 with endoscopic vein harvest from the right leg on 02/28/2016. The patient recovered well but did have to be reintubated temporarily postsurgically. He did have some postoperative by motor load and responded well to diuretics. The patient may remained in normal sinus rhythm postoperatively. He was discharged on 03/20/2016, and is here for post hospitalization follow-up.  He is to be referred to cardiac rehabilitation.  He is here with some fatigue but is slowly improving. He is medically compliant, he has not yet been contacted by cardiac rehabilitation. He has minimal pain. He is walking daily for short periods of time and short distances. He denies bleeding worsening shortness of breath or severe pain.  Past Medical History:  Diagnosis Date  . Adenomatous colon polyp 08/2010  . Chronic back pain   . Coronary artery disease   . Diabetes mellitus without complication ([place])   . GERD (gastroesophageal reflux disease)   . Hiatal hernia   . HTN (hypertension)   . Hyperlipidemia   . Renal cyst    Large Left  . Restless legs   . Sleep apnea    uses cpap    Past Surgical History:  Procedure Laterality Date  . APPENDECTOMY  1967  . CARDIAC CATHETERIZATION N/A 01/31/2016   Procedure: Left Heart Cath and Coronary Angiography;  Surgeon: Belva Crome, MD;  Location: [facility];  Service: Cardiovascular;  Laterality: N/A;  . CARDIOVASCULAR STRESS TEST  2004   Treadmill Stress Test  . CARDIOVASCULAR STRESS TEST  2010  . CHOLECYSTECTOMY  07/2006  . COLONOSCOPY  08/2010   RMR: Cecal tubular adenoma removed, otherwise normal exam. next tcs 08/2015  . CORONARY ARTERY BYPASS GRAFT N/A 02/28/2016   Procedure: CORONARY ARTERY BYPASS GRAFTING x two using left internal mammary artery and right leg greater saphenous vein harvested endoscopically;  Surgeon: Gaye Pollack, MD;  Location: [facility];  Service: Open Heart Surgery;  Laterality: N/A;  . KNEE SURGERY  1995   Micro  . TEE WITHOUT CARDIOVERSION N/A 02/28/2016   Procedure: TRANSESOPHAGEAL ECHOCARDIOGRAM (TEE);  Surgeon: Gaye Pollack, MD;  Location: [facility];  Service: Open Heart Surgery;  Laterality: N/A;  . UPPER GASTROINTESTINAL ENDOSCOPY  08/2010   RMR: GERD benign biopsy  . VASECTOMY       Current Outpatient Prescriptions  Medication Sig Dispense Refill  . aspirin EC 325 MG EC tablet Take 1 tablet (325  mg total) by mouth daily. 30 tablet 0  . esomeprazole (NEXIUM) 20 MG capsule Take 1 capsule (20 mg total) by mouth daily as needed (heartburn). 90 capsule 3  . lisinopril (PRINIVIL,ZESTRIL) 40 MG tablet Take 1 tablet (40 mg total) by mouth daily. (Patient taking differently: Take 40 mg by mouth every evening. ) 90 tablet 3  . metFORMIN (GLUCOPHAGE) 500 MG tablet Take 500 mg by mouth 2 (two) times daily with a meal.    . metoprolol tartrate (LOPRESSOR) 25 MG tablet Take 0.5 tablets (12.5 mg total) by mouth 2 (two) times daily. 30 tablet 1  . mometasone (NASONEX) 50 MCG/ACT nasal spray Place 2 sprays into the nose daily as needed (for nasal congestion).    . pravastatin (PRAVACHOL) 20 MG tablet Take 1 tablet (20 mg total) by mouth every evening. 90 tablet 3  . furosemide (LASIX) 40 MG tablet Take 1 tablet (40 mg total) by mouth daily. 7 tablet 0  . potassium chloride SA (K-DUR,KLOR-CON) 20 MEQ tablet Take 1 tablet (20 mEq total) by mouth daily. 7 tablet 0   No current facility-administered medications for this visit.     Allergies:   Other and Simvastatin    Social History:  The patient  reports that he has never smoked. He has never used smokeless tobacco. He reports that he drinks about 1.2 oz of alcohol per week . He reports that he does not use drugs.   Family History:  The patient's family history includes Diabetes in his other; Heart attack in his other; Lymphoma in his mother; Ulcers in his father and paternal grandfather.    ROS: All other systems are reviewed and negative. Unless otherwise mentioned in H&P    PHYSICAL EXAM: VS:  BP 136/76   Pulse 77   Ht 5' 9.5" (1.765 m)   Wt 209 lb (94.8 kg)   SpO2 96%   BMI 30.42 kg/m  , BMI Body mass index is 30.42 kg/m. GEN: Well nourished, well developed, in no acute distress  HEENT: normal  Neck: no JVD, carotid bruits, or masses Cardiac: RRR; no murmurs, rubs, or gallops,no edema  Respiratory:  Minimal crackles in the bases without  wheezes no pain with inspiration GI: soft, nontender, nondistended, + BS MS: no deformity or atrophy well healed sternotomy scar with well-healed scars from removal of chest tubes and pacing wires. No evidence of evisceration or infection. Skin: warm and dry, no rash Neuro:  Strength and sensation are intact Psych: euthymic mood, full affect   EKG:  The ekg ordered today demonstrates normal sinus rhythm with diffuse nonspecific T-wave abnormalities inferior lateral poor R-wave progression heart rate of 72 bpm   Recent Labs: 08/09/2015: TSH 1.39 02/24/2016: ALT 37 02/29/2016: Magnesium 2.2 03/02/2016: BUN 12; Creatinine, Ser 0.87; Hemoglobin 10.5; Platelets 178; Potassium 3.8; Sodium 140    Lipid Panel    Component Value Date/Time   CHOL 193 06/20/2011 1106   TRIG 250 (H) 06/20/2011 1106   HDL 41 06/20/2011 1106   CHOLHDL 4.7 06/20/2011 1106  VLDL 50 (H) 06/20/2011 1106   LDLCALC 102 (H) 06/20/2011 1106      Wt Readings from Last 3 Encounters:  03/17/16 209 lb (94.8 kg)  03/04/16 219 lb 4.8 oz (99.5 kg)  02/24/16 212 lb 11.2 oz (96.5 kg)    ASSESSMENT AND PLAN:  1. Coronary artery disease: Status post cardiac catheterization revealing severe circumflex disease and distal LAD disease. He is status post 2 vessel coronary artery bypass grafting by Dr. Caffie Pinto. He remains medically compliant, has good pain control, and is slowly increasing his activity. He is going to be referred to cardiac rehabilitation. He will continue his current medication regimen.  I have gone over his medications and he is taking metoprolol 25 mg twice a day is supposed to 12.5 mg twice a day ordered on discharge. I will reduce the dose as this is what was ordered during hospitalization and discharge. May need to titrate up if necessary but for now will reduce back to prescribed dosing. Blood pressure and heart rate are well controlled on 25 mg twice a day currently.  2. Hypertension: Blood pressure is well  controlled currently. No evidence of fluid retention or edema. He will continue on lisinopril 40 mg daily as directed Lasix as directed by CVTS.  3. Diabetes: Followed by primary care  4. GERD: I have given him refills on Nexium as this was not filled on discharge. He will follow with primary care for ongoing management.   Current medicines are reviewed at length with the patient today.    Labs/ tests ordered today include: Follow-up labs per Dr. Caffie Pinto recommend CBC and BMET   Orders Placed This Encounter  Procedures  . AMB referral to cardiac rehabilitation  . EKG 12-Lead     Disposition:   FU with 3 months unless symptomatic  Signed, Jory Sims, NP  03/17/2016 2:07 PM    [facility] [address] [place], [place] [address] Phone: [phone]; Fax: [phone]

## 2016-04-04 ENCOUNTER — Other Ambulatory Visit: Payer: Self-pay | Admitting: Surgery

## 2016-04-04 DIAGNOSIS — Z951 Presence of aortocoronary bypass graft: Secondary | ICD-10-CM

## 2016-04-05 ENCOUNTER — Ambulatory Visit (INDEPENDENT_AMBULATORY_CARE_PROVIDER_SITE_OTHER): Payer: Self-pay | Admitting: Surgery

## 2016-04-05 ENCOUNTER — Ambulatory Visit
Admission: RE | Admit: 2016-04-05 | Discharge: 2016-04-05 | Disposition: A | Discharge status: Home / Self Care | Payer: Medicare HMO | Source: Ambulatory Visit | Attending: Surgery | Admitting: Surgery

## 2016-04-05 VITALS — BP 136/84 | HR 68 | Resp 16 | Ht 69.5 in | Wt 198.0 lb

## 2016-04-05 DIAGNOSIS — I2510 Atherosclerotic heart disease of native coronary artery without angina pectoris: Secondary | ICD-10-CM

## 2016-04-05 DIAGNOSIS — Z951 Presence of aortocoronary bypass graft: Secondary | ICD-10-CM

## 2016-04-05 DIAGNOSIS — Z950 Presence of cardiac pacemaker: Secondary | ICD-10-CM | POA: Diagnosis not present

## 2016-04-09 ENCOUNTER — Encounter: Payer: Self-pay | Admitting: Surgery

## 2016-04-09 NOTE — Progress Notes (Signed)
     HPI: Patient returns for routine postoperative follow-up having undergone CABG x 2 on 02/28/2016. The patient's early postoperative recovery while in the hospital was notable for an uncomplicated postop course. Since hospital discharge the patient reports that he has been feeling well and is walking daily without chest pain or shortness of breath.   Current Outpatient Prescriptions  Medication Sig Dispense Refill  . aspirin EC 325 MG EC tablet Take 1 tablet (325 mg total) by mouth daily. 30 tablet 0  . esomeprazole (NEXIUM) 20 MG capsule Take 1 capsule (20 mg total) by mouth daily as needed (heartburn). 90 capsule 3  . lisinopril (PRINIVIL,ZESTRIL) 40 MG tablet Take 1 tablet (40 mg total) by mouth daily. (Patient taking differently: Take 40 mg by mouth every evening. ) 90 tablet 3  . metFORMIN (GLUCOPHAGE) 500 MG tablet Take 500 mg by mouth 2 (two) times daily with a meal.    . metoprolol tartrate (LOPRESSOR) 25 MG tablet Take 0.5 tablets (12.5 mg total) by mouth 2 (two) times daily. 30 tablet 1  . mometasone (NASONEX) 50 MCG/ACT nasal spray Place 2 sprays into the nose daily as needed (for nasal congestion).    . pravastatin (PRAVACHOL) 20 MG tablet Take 1 tablet (20 mg total) by mouth every evening. 90 tablet 3  . furosemide (LASIX) 40 MG tablet Take 1 tablet (40 mg total) by mouth daily. 7 tablet 0  . potassium chloride SA (K-DUR,KLOR-CON) 20 MEQ tablet Take 1 tablet (20 mEq total) by mouth daily. 7 tablet 0   No current facility-administered medications for this visit.     Physical Exam: BP 136/84   Pulse 68   Resp 16   Ht 5' 9.5" (1.765 m)   Wt 198 lb (89.8 kg)   SpO2 97% Comment: ON RA  BMI 28.82 kg/m  He looks well. Lung exam is clear. Cardiac exam shows a regular rate and rhythm with normal heart sounds. Chest incision is healing well and sternum is stable. The leg incisions are healing well and there is no peripheral edema.    Diagnostic Tests:  CLINICAL DATA:   Previous CABG 5 weeks ago.  Follow-up.  EXAM: CHEST  2 VIEW  COMPARISON:  03/01/2016  FINDINGS: Previous median sternotomy. Chronically enlarged cardiac silhouette. Aortic atherosclerosis. The lungs are clear. No edema or effusions. No bone abnormality.  IMPRESSION: Previous CABG. No active finding. Chronic cardiomegaly and aortic atherosclerosis.   Electronically Signed   By: Nelson Chimes M.D.   On: 04/05/2016 09:51  Impression:  Overall I think he is doing well. I encouraged him to continue walking. He is planning to participate in cardiac rehab. I told him he could drive his car but should not lift anything heavier than 10 lbs for three months postop.   Plan:  He will continue to follow up with cardiology and Dr. Wende Neighbors for his primary care and will contact me if he has any problems with his incisions.   Gaye Pollack, MD [facility] [phone]

## 2016-04-17 ENCOUNTER — Telehealth: Payer: Self-pay | Admitting: Adult Health

## 2016-04-17 NOTE — Telephone Encounter (Signed)
Patient has knot on leg at site of procedure 2 mths ago. Please call patient to discuss. / tg

## 2016-04-18 NOTE — Telephone Encounter (Signed)
Pt already called Dr Earlene Plater office and they told him it could be a pocket of fluid they could drain or scar tissue.He is going to wait a month and revisit with them if it hasn't changed

## 2016-04-19 DIAGNOSIS — J060 Acute laryngopharyngitis: Secondary | ICD-10-CM | POA: Diagnosis not present

## 2016-04-30 ENCOUNTER — Other Ambulatory Visit: Payer: Self-pay | Admitting: Physician Assistant

## 2016-05-01 DEATH — deceased

## 2016-05-16 DIAGNOSIS — R972 Elevated prostate specific antigen [PSA]: Secondary | ICD-10-CM | POA: Diagnosis not present

## 2016-05-16 DIAGNOSIS — E1165 Type 2 diabetes mellitus with hyperglycemia: Secondary | ICD-10-CM | POA: Diagnosis not present

## 2016-05-16 DIAGNOSIS — E782 Mixed hyperlipidemia: Secondary | ICD-10-CM | POA: Diagnosis not present

## 2016-05-16 DIAGNOSIS — I10 Essential (primary) hypertension: Secondary | ICD-10-CM | POA: Diagnosis not present

## 2016-05-23 DIAGNOSIS — Z0001 Encounter for general adult medical examination with abnormal findings: Secondary | ICD-10-CM | POA: Diagnosis not present

## 2016-05-23 DIAGNOSIS — E1165 Type 2 diabetes mellitus with hyperglycemia: Secondary | ICD-10-CM | POA: Diagnosis not present

## 2016-05-23 DIAGNOSIS — I10 Essential (primary) hypertension: Secondary | ICD-10-CM | POA: Diagnosis not present

## 2016-05-23 DIAGNOSIS — K219 Gastro-esophageal reflux disease without esophagitis: Secondary | ICD-10-CM | POA: Diagnosis not present

## 2016-05-23 DIAGNOSIS — I2510 Atherosclerotic heart disease of native coronary artery without angina pectoris: Secondary | ICD-10-CM | POA: Diagnosis not present

## 2016-05-23 DIAGNOSIS — E782 Mixed hyperlipidemia: Secondary | ICD-10-CM | POA: Diagnosis not present

## 2016-05-26 ENCOUNTER — Other Ambulatory Visit: Payer: Self-pay | Admitting: *Deleted

## 2016-05-26 MED ORDER — LISINOPRIL 40 MG PO TABS: 40.0000 mg | ORAL_TABLET | Freq: Every evening | ORAL | 3 refills | Status: DC

## 2016-06-19 ENCOUNTER — Ambulatory Visit: Payer: Medicare HMO | Admitting: Adult Health

## 2016-06-22 NOTE — Progress Notes (Signed)
Cardiology Office Note   Date:  06/23/2016   ID:  Thomas [name], Thomas [name] 1951-01-25, MRN [id number]  PCP:  Wende Neighbors, MD  Cardiologist: Cloria Spring, NP   Chief Complaint  Patient presents with  . Coronary Artery Disease  . Hypertension    History of Present Illness: Thomas [name] is a 66 y.o. male who presents for for ongoing assessment and management of CAD, bradycardia, and hypertension. The patient was last seen in the office with recurrent chest pain. The patient was sent for cardiac catheterization on 01/31/2016. This revealed multivessel coronary artery disease:  Conclusion     Ost Cx lesion, 75 %stenosed.  LM lesion, 50 %stenosed.  The left ventricular systolic function is normal.  The left ventricular ejection fraction is 50-55% by visual estimate.  LV end diastolic pressure is mildly elevated.   Eccentric 70-80% proximal/ostial circumflex. Circumflex territory is large.  50% stenosis in the distal left main.  Codominant right coronary  Overall normal LV function with mild elevation and end-diastolic pressure.   This was followed by coronary artery bypass grafting 2, with left internal mammary artery to LAD and SVG to OM 2 with endoscopic vein harvest from the right leg on 02/28/2016. The patient recovered well but did have to be reintubated temporarily postsurgically. He did have some postoperative by motor load and responded well to diuretics. The patient may remained in normal sinus rhythm postoperatively.   On last office visit, I decreased the metoprolol to 12.5 mg BID. He was complaining of fatigue and was bradycardic. He has since seen Dr. Cyndia Bent for post operative evaluation.He has been released from [facility] for now.   Since being seen last, he has been seen by his PCP and medications were changed. He went out of town and did not take them consistently. Took a friends lisinopril when he ran out of his own medication. He is now back on his  medications and taking them as directed.   Past Medical History:  Diagnosis Date  . Adenomatous colon polyp 08/2010  . Chronic back pain   . Coronary artery disease   . Diabetes mellitus without complication ([place])   . GERD (gastroesophageal reflux disease)   . Hiatal hernia   . HTN (hypertension)   . Hyperlipidemia   . Renal cyst    Large Left  . Restless legs   . Sleep apnea    uses cpap    Past Surgical History:  Procedure Laterality Date  . APPENDECTOMY  1967  . CARDIAC CATHETERIZATION N/A 01/31/2016   Procedure: Left Heart Cath and Coronary Angiography;  Surgeon: Belva Crome, MD;  Location: [facility];  Service: Cardiovascular;  Laterality: N/A;  . CARDIOVASCULAR STRESS TEST  2004   Treadmill Stress Test  . CARDIOVASCULAR STRESS TEST  2010  . CHOLECYSTECTOMY  07/2006  . COLONOSCOPY  08/2010   RMR: Cecal tubular adenoma removed, otherwise normal exam. next tcs 08/2015  . CORONARY ARTERY BYPASS GRAFT N/A 02/28/2016   Procedure: CORONARY ARTERY BYPASS GRAFTING x two using left internal mammary artery and right leg greater saphenous vein harvested endoscopically;  Surgeon: Gaye Pollack, MD;  Location: [facility];  Service: Open Heart Surgery;  Laterality: N/A;  . [place]  . TEE WITHOUT CARDIOVERSION N/A 02/28/2016   Procedure: TRANSESOPHAGEAL ECHOCARDIOGRAM (TEE);  Surgeon: Gaye Pollack, MD;  Location: [facility];  Service: Open Heart Surgery;  Laterality: N/A;  . UPPER GASTROINTESTINAL ENDOSCOPY  08/2010   RMR: GERD benign biopsy  . VASECTOMY       Current Outpatient Prescriptions  Medication Sig Dispense Refill  . aspirin EC 325 MG EC tablet Take 1 tablet (325 mg total) by mouth daily. 30 tablet 0  . esomeprazole (NEXIUM) 20 MG capsule Take 1 capsule (20 mg total) by mouth daily as needed (heartburn). 90 capsule 3  . furosemide (LASIX) 40 MG tablet Take 1 tablet (40 mg total) by mouth daily. 7 tablet 0  . lisinopril (PRINIVIL,ZESTRIL) 40 MG tablet  Take 1 tablet (40 mg total) by mouth every evening. 90 tablet 3  . metFORMIN (GLUCOPHAGE) 500 MG tablet Take 500 mg by mouth 2 (two) times daily with a meal.    . metoprolol tartrate (LOPRESSOR) 25 MG tablet Take 0.5 tablets (12.5 mg total) by mouth 2 (two) times daily. 30 tablet 1  . mometasone (NASONEX) 50 MCG/ACT nasal spray Place 2 sprays into the nose daily as needed (for nasal congestion).    . potassium chloride SA (K-DUR,KLOR-CON) 20 MEQ tablet Take 1 tablet (20 mEq total) by mouth daily. 7 tablet 0  . pravastatin (PRAVACHOL) 20 MG tablet Take 1 tablet (20 mg total) by mouth every evening. 90 tablet 3   No current facility-administered medications for this visit.     Allergies:   Other and Simvastatin    Social History:  The patient  reports that he has never smoked. He has never used smokeless tobacco. He reports that he drinks about 1.2 oz of alcohol per week . He reports that he does not use drugs.   Family History:  The patient's family history includes Diabetes in his other; Heart attack in his other; Lymphoma in his mother; Ulcers in his father and paternal grandfather.    ROS: All other systems are reviewed and negative. Unless otherwise mentioned in H&P    PHYSICAL EXAM: VS:  BP 136/82   Pulse 70   Ht 5\' 9"  (1.753 m)   Wt 212 lb (96.2 kg)   SpO2 98%   BMI 31.31 kg/m  , BMI Body mass index is 31.31 kg/m. GEN: Well nourished, well developed, in no acute distress  HEENT: normal  Neck: no JVD, carotid bruits, or masses Cardiac: RRR; no murmurs, rubs, or gallops,no edema  Respiratory:  clear to auscultation bilaterally, normal work of breathing GI: soft, nontender, nondistended, + BS MS: no deformity or atrophy  Skin: warm and dry, no rash Neuro:  Strength and sensation are intact Psych: euthymic mood, full affect   Recent Labs: 08/09/2015: TSH 1.39 02/24/2016: ALT 37 02/29/2016: Magnesium 2.2 03/02/2016: BUN 12; Creatinine, Ser 0.87; Hemoglobin 10.5; Platelets  178; Potassium 3.8; Sodium 140    Lipid Panel    Component Value Date/Time   CHOL 193 06/20/2011 1106   TRIG 250 (H) 06/20/2011 1106   HDL 41 06/20/2011 1106   CHOLHDL 4.7 06/20/2011 1106   VLDL 50 (H) 06/20/2011 1106   LDLCALC 102 (H) 06/20/2011 1106      Wt Readings from Last 3 Encounters:  06/23/16 212 lb (96.2 kg)  04/05/16 198 lb (89.8 kg)  03/17/16 209 lb (94.8 kg)      Other studies Reviewed: Echocardiogram Feb 18, 2014.  Left ventricle: The cavity size was normal. Wall thickness was increased in a pattern of mild LVH. Systolic function was normal. The estimated ejection fraction was in the range of 55% to 60%. Wall motion was normal; there were no regional wall motion abnormalities. Doppler parameters are consistent  with abnormal left ventricular relaxation (grade 1 diastolic dysfunction). Doppler parameters are consistent with elevated mean left atrial filling pressure. Medial E/e&' 12.4. - Left atrium: The atrium was moderately dilated. - Right atrium: The atrium was mildly dilated. - Pulmonic valve: There was mild regurgitation.  ASSESSMENT AND PLAN:  1.  CAD: Asymptomatic concerning chest pain, dyspnea or fatigue. He is  Essentially medically compliant except when he goes out of town. He is advised to make sure he takes his medication bottles with him each time he travels. Continue current regimen for now. Labs are followed by PCP.   2. Hypertension: BP is normal. Continue low salt diet. Will need weight loss.   3. Hypercholesterolemia; Continue statin therapy. Will need follow up labs. Low cholesterol diet.    Current medicines are reviewed at length with the patient today.    Labs/ tests ordered today include:  No orders of the defined types were placed in this encounter.    Disposition:   FU with Dr. Harl Bowie in  6 months unless symptomatic.   Signed, Jory Sims, NP  06/23/2016 1:42 PM    [facility] [address], [place], [place] [address] Phone: [phone]; Fax: [phone]

## 2016-06-23 ENCOUNTER — Encounter: Payer: Self-pay | Admitting: Adult Health

## 2016-06-23 ENCOUNTER — Ambulatory Visit (INDEPENDENT_AMBULATORY_CARE_PROVIDER_SITE_OTHER): Payer: Medicare HMO | Admitting: Adult Health

## 2016-06-23 VITALS — BP 136/82 | HR 70 | Ht 69.0 in | Wt 212.0 lb

## 2016-06-23 DIAGNOSIS — E7800 Pure hypercholesterolemia, unspecified: Secondary | ICD-10-CM | POA: Diagnosis not present

## 2016-06-23 DIAGNOSIS — I10 Essential (primary) hypertension: Secondary | ICD-10-CM

## 2016-06-23 DIAGNOSIS — I2510 Atherosclerotic heart disease of native coronary artery without angina pectoris: Secondary | ICD-10-CM

## 2016-06-23 NOTE — Progress Notes (Signed)
Name: Thomas [name]    DOB: 10/18/1950  Age: 66 y.o.  MR#: [id number]       PCP:  Wende Neighbors, MD      Insurance: Payor: [employer] MEDICARE / Plan: [employer] MEDICARE HMO/PPO / Product Type: *No Product type* /   CC:    Chief Complaint  Patient presents with  . Coronary Artery Disease  . Hypertension    VS Vitals:   06/23/16 1340  BP: 136/82  Pulse: 70  SpO2: 98%  Weight: 212 lb (96.2 kg)  Height: 5\' 9"  (1.753 m)    Weights Current Weight  06/23/16 212 lb (96.2 kg)  04/05/16 198 lb (89.8 kg)  03/17/16 209 lb (94.8 kg)    Blood Pressure  BP Readings from Last 3 Encounters:  06/23/16 136/82  04/05/16 136/84  03/17/16 136/76     Admit date:  (Not on file) Last encounter with RMR:  04/17/2016   Allergy Other and Simvastatin  Current Outpatient Prescriptions  Medication Sig Dispense Refill  . aspirin EC 325 MG EC tablet Take 1 tablet (325 mg total) by mouth daily. 30 tablet 0  . furosemide (LASIX) 40 MG tablet Take 1 tablet (40 mg total) by mouth daily. 7 tablet 0  . metFORMIN (GLUCOPHAGE) 500 MG tablet Take 500 mg by mouth 2 (two) times daily with a meal.    . metoprolol tartrate (LOPRESSOR) 25 MG tablet Take 0.5 tablets (12.5 mg total) by mouth 2 (two) times daily. 30 tablet 1  . mometasone (NASONEX) 50 MCG/ACT nasal spray Place 2 sprays into the nose daily as needed (for nasal congestion).    . pravastatin (PRAVACHOL) 20 MG tablet Take 1 tablet (20 mg total) by mouth every evening. 90 tablet 3   No current facility-administered medications for this visit.     Discontinued Meds:    Medications Discontinued During This Encounter  Medication Reason  . lisinopril (PRINIVIL,ZESTRIL) 40 MG tablet Error  . esomeprazole (NEXIUM) 20 MG capsule Error  . potassium chloride SA (K-DUR,KLOR-CON) 20 MEQ tablet Error    Patient Active Problem List   Diagnosis Date Noted  . S/P CABG x 2 02/28/2016  . Adenomatous colon polyp 03/30/2011  . GERD 07/08/2010  . GASTRITIS 07/08/2010  .  BACK PAIN 03/29/2010  . ANXIETY DEPRESSION 12/02/2009  . UNSPECIFIED VITAMIN D DEFICIENCY 05/13/2009  . RENAL CYST 06/24/2008  . PARESTHESIA 06/24/2008  . OTHER SYMPTOMS INVOLVING CARDIOVASCULAR SYSTEM 06/24/2008  . CHEST PAIN, ATYPICAL 06/24/2008  . RUQ PAIN 06/24/2008  . FATIGUE 01/16/2008  . SYMPTOM, PAIN, ABDOMINAL, EPIGASTRIC 08/08/2006  . INSOMNIA NOS 02/06/2006    LABS    Component Value Date/Time   NA 140 03/02/2016 0235   NA 139 03/01/2016 0410   NA 143 02/29/2016 1716   K 3.8 03/02/2016 0235   K 3.4 (L) 03/01/2016 0410   K 3.3 (L) 02/29/2016 1716   CL 108 03/02/2016 0235   CL 107 03/01/2016 0410   CL 104 02/29/2016 1716   CO2 26 03/02/2016 0235   CO2 26 03/01/2016 0410   CO2 24 02/29/2016 0350   GLUCOSE 93 03/02/2016 0235   GLUCOSE 116 (H) 03/01/2016 0410   GLUCOSE 108 (H) 02/29/2016 1716   BUN 12 03/02/2016 0235   BUN 12 03/01/2016 0410   BUN 12 02/29/2016 1716   CREATININE 0.87 03/02/2016 0235   CREATININE 0.85 03/01/2016 0410   CREATININE 0.90 02/29/2016 1716   CREATININE 1.39 (H) 01/29/2016 1103   CREATININE 0.96 08/09/2015 1310  CREATININE 0.97 02/02/2014 1550   CALCIUM 8.1 (L) 03/02/2016 0235   CALCIUM 7.6 (L) 03/01/2016 0410   CALCIUM 7.2 (L) 02/29/2016 0350   GFRNONAA >60 03/02/2016 0235   GFRNONAA >60 03/01/2016 0410   GFRNONAA >60 02/29/2016 1700   GFRNONAA 82 02/02/2014 1550   GFRNONAA >89 06/20/2011 1106   GFRAA >60 03/02/2016 0235   GFRAA >60 03/01/2016 0410   GFRAA >60 02/29/2016 1700   GFRAA >89 02/02/2014 1550   GFRAA >89 06/20/2011 1106   CMP     Component Value Date/Time   NA 140 03/02/2016 0235   K 3.8 03/02/2016 0235   CL 108 03/02/2016 0235   CO2 26 03/02/2016 0235   GLUCOSE 93 03/02/2016 0235   BUN 12 03/02/2016 0235   CREATININE 0.87 03/02/2016 0235   CREATININE 1.39 (H) 01/29/2016 1103   CALCIUM 8.1 (L) 03/02/2016 0235   PROT 6.5 02/24/2016 1350   ALBUMIN 3.9 02/24/2016 1350   AST 36 02/24/2016 1350   ALT 37  02/24/2016 1350   ALKPHOS 45 02/24/2016 1350   BILITOT 0.7 02/24/2016 1350   GFRNONAA >60 03/02/2016 0235   GFRNONAA 82 02/02/2014 1550   GFRAA >60 03/02/2016 0235   GFRAA >89 02/02/2014 1550       Component Value Date/Time   WBC 10.1 03/02/2016 0235   WBC 11.3 (H) 03/01/2016 0410   WBC 11.8 (H) 02/29/2016 1700   HGB 10.5 (L) 03/02/2016 0235   HGB 10.6 (L) 03/01/2016 0410   HGB 11.9 (L) 02/29/2016 1716   HCT 32.6 (L) 03/02/2016 0235   HCT 32.6 (L) 03/01/2016 0410   HCT 35.0 (L) 02/29/2016 1716   MCV 82.7 03/02/2016 0235   MCV 82.3 03/01/2016 0410   MCV 83.2 02/29/2016 1700    Lipid Panel     Component Value Date/Time   CHOL 193 06/20/2011 1106   TRIG 250 (H) 06/20/2011 1106   HDL 41 06/20/2011 1106   CHOLHDL 4.7 06/20/2011 1106   VLDL 50 (H) 06/20/2011 1106   LDLCALC 102 (H) 06/20/2011 1106    ABG    Component Value Date/Time   PHART 7.386 02/29/2016 0643   PCO2ART 39.3 02/29/2016 0643   PO2ART 86.0 02/29/2016 0643   HCO3 23.4 02/29/2016 0643   TCO2 25 02/29/2016 1716   ACIDBASEDEF 1.0 02/29/2016 0643   O2SAT 96.0 02/29/2016 0643     Lab Results  Component Value Date   TSH 1.39 08/09/2015   BNP (last 3 results) No results for input(s): BNP in the last 8760 hours.  ProBNP (last 3 results) No results for input(s): PROBNP in the last 8760 hours.  Cardiac Panel (last 3 results) No results for input(s): CKTOTAL, CKMB, TROPONINI, RELINDX in the last 72 hours.  Iron/TIBC/Ferritin/ %Sat    Component Value Date/Time   IRON 66 01/16/2009 0001     EKG Orders placed or performed in visit on 03/17/16  . EKG 12-Lead     Prior Assessment and Plan Problem List as of 06/23/2016 Reviewed: 04/09/2016 11:08 AM by Gaye Pollack, MD     Digestive   GERD   Last Assessment & Plan 03/06/2012 Office Visit Written 03/08/2012  9:11 AM by Mahala Menghini, PA    Continue Nexium 40 mg daily. Typical reflux symptoms well controlled.      GASTRITIS   Adenomatous colon  polyp     Genitourinary   RENAL CYST     Other   UNSPECIFIED VITAMIN D DEFICIENCY   ANXIETY DEPRESSION  BACK PAIN   Last Assessment & Plan 03/30/2011 Office Visit Written 03/30/2011  1:10 PM by Andria Meuse, NP    Follow up with her primary care provider for this      INSOMNIA NOS   FATIGUE   PARESTHESIA   OTHER SYMPTOMS INVOLVING CARDIOVASCULAR SYSTEM   CHEST PAIN, ATYPICAL   RUQ PAIN   Last Assessment & Plan 03/06/2012 Office Visit Edited 03/08/2012 10:23 AM by Mahala Menghini, PA    Intermittent right upper quadrant pain, persistent now for the past 2 weeks however. Better at this point then when it first started again. Etiology not well-defined. Workup of small bowel abnormality as outlined above. MRE abdomen was okay. Offered patient repeat CT abdomen to make sure no recurrent small bowel issues such as wall thickening, tumor. At this point the patient is not interested in further imaging. He'll let us know if symptoms do not settle down and he prefers to pursue further workup.  Labs ordered.       SYMPTOM, PAIN, ABDOMINAL, EPIGASTRIC   S/P CABG x 2       Imaging: No results found.

## 2016-06-23 NOTE — Patient Instructions (Signed)
Your physician wants you to follow-up in: 6 Months with Dr. Branch. You will receive a reminder letter in the mail two months in advance. If you don't receive a letter, please call our office to schedule the follow-up appointment.  Your physician recommends that you continue on your current medications as directed. Please refer to the Current Medication list given to you today.  If you need a refill on your cardiac medications before your next appointment, please call your pharmacy.  Thank you for choosing [facility]!   

## 2016-07-17 DIAGNOSIS — R05 Cough: Secondary | ICD-10-CM | POA: Diagnosis not present

## 2016-07-17 DIAGNOSIS — J00 Acute nasopharyngitis [common cold]: Secondary | ICD-10-CM | POA: Diagnosis not present

## 2016-08-04 ENCOUNTER — Other Ambulatory Visit: Payer: Self-pay

## 2016-08-04 MED ORDER — LISINOPRIL 40 MG PO TABS: 40.0000 mg | ORAL_TABLET | Freq: Every day | ORAL | 3 refills | Status: DC

## 2016-08-04 NOTE — Telephone Encounter (Signed)
Refilled lisinopril via fax request from pharmacy

## 2016-08-18 DIAGNOSIS — J069 Acute upper respiratory infection, unspecified: Secondary | ICD-10-CM | POA: Diagnosis not present

## 2016-08-18 DIAGNOSIS — R05 Cough: Secondary | ICD-10-CM | POA: Diagnosis not present

## 2016-08-18 DIAGNOSIS — I10 Essential (primary) hypertension: Secondary | ICD-10-CM | POA: Diagnosis not present

## 2016-09-29 DIAGNOSIS — Z0100 Encounter for examination of eyes and vision without abnormal findings: Secondary | ICD-10-CM | POA: Diagnosis not present

## 2016-12-05 DIAGNOSIS — I10 Essential (primary) hypertension: Secondary | ICD-10-CM | POA: Diagnosis not present

## 2016-12-05 DIAGNOSIS — Z1159 Encounter for screening for other viral diseases: Secondary | ICD-10-CM | POA: Diagnosis not present

## 2016-12-05 DIAGNOSIS — E1165 Type 2 diabetes mellitus with hyperglycemia: Secondary | ICD-10-CM | POA: Diagnosis not present

## 2016-12-05 DIAGNOSIS — R972 Elevated prostate specific antigen [PSA]: Secondary | ICD-10-CM | POA: Diagnosis not present

## 2016-12-06 DIAGNOSIS — D649 Anemia, unspecified: Secondary | ICD-10-CM | POA: Diagnosis not present

## 2016-12-06 DIAGNOSIS — K219 Gastro-esophageal reflux disease without esophagitis: Secondary | ICD-10-CM | POA: Diagnosis not present

## 2016-12-06 DIAGNOSIS — I10 Essential (primary) hypertension: Secondary | ICD-10-CM | POA: Diagnosis not present

## 2016-12-06 DIAGNOSIS — E782 Mixed hyperlipidemia: Secondary | ICD-10-CM | POA: Diagnosis not present

## 2016-12-06 DIAGNOSIS — Z6831 Body mass index (BMI) 31.0-31.9, adult: Secondary | ICD-10-CM | POA: Diagnosis not present

## 2016-12-06 DIAGNOSIS — E1165 Type 2 diabetes mellitus with hyperglycemia: Secondary | ICD-10-CM | POA: Diagnosis not present

## 2017-03-07 IMAGING — DX DG CHEST 1V PORT
1 series · 1 of 1 positions shown · non-contrast
Comparison: 02/24/2016

CLINICAL DATA: Postoperative day 0 status post CABG.

EXAM:
PORTABLE CHEST 1 VIEW

[chest ap]
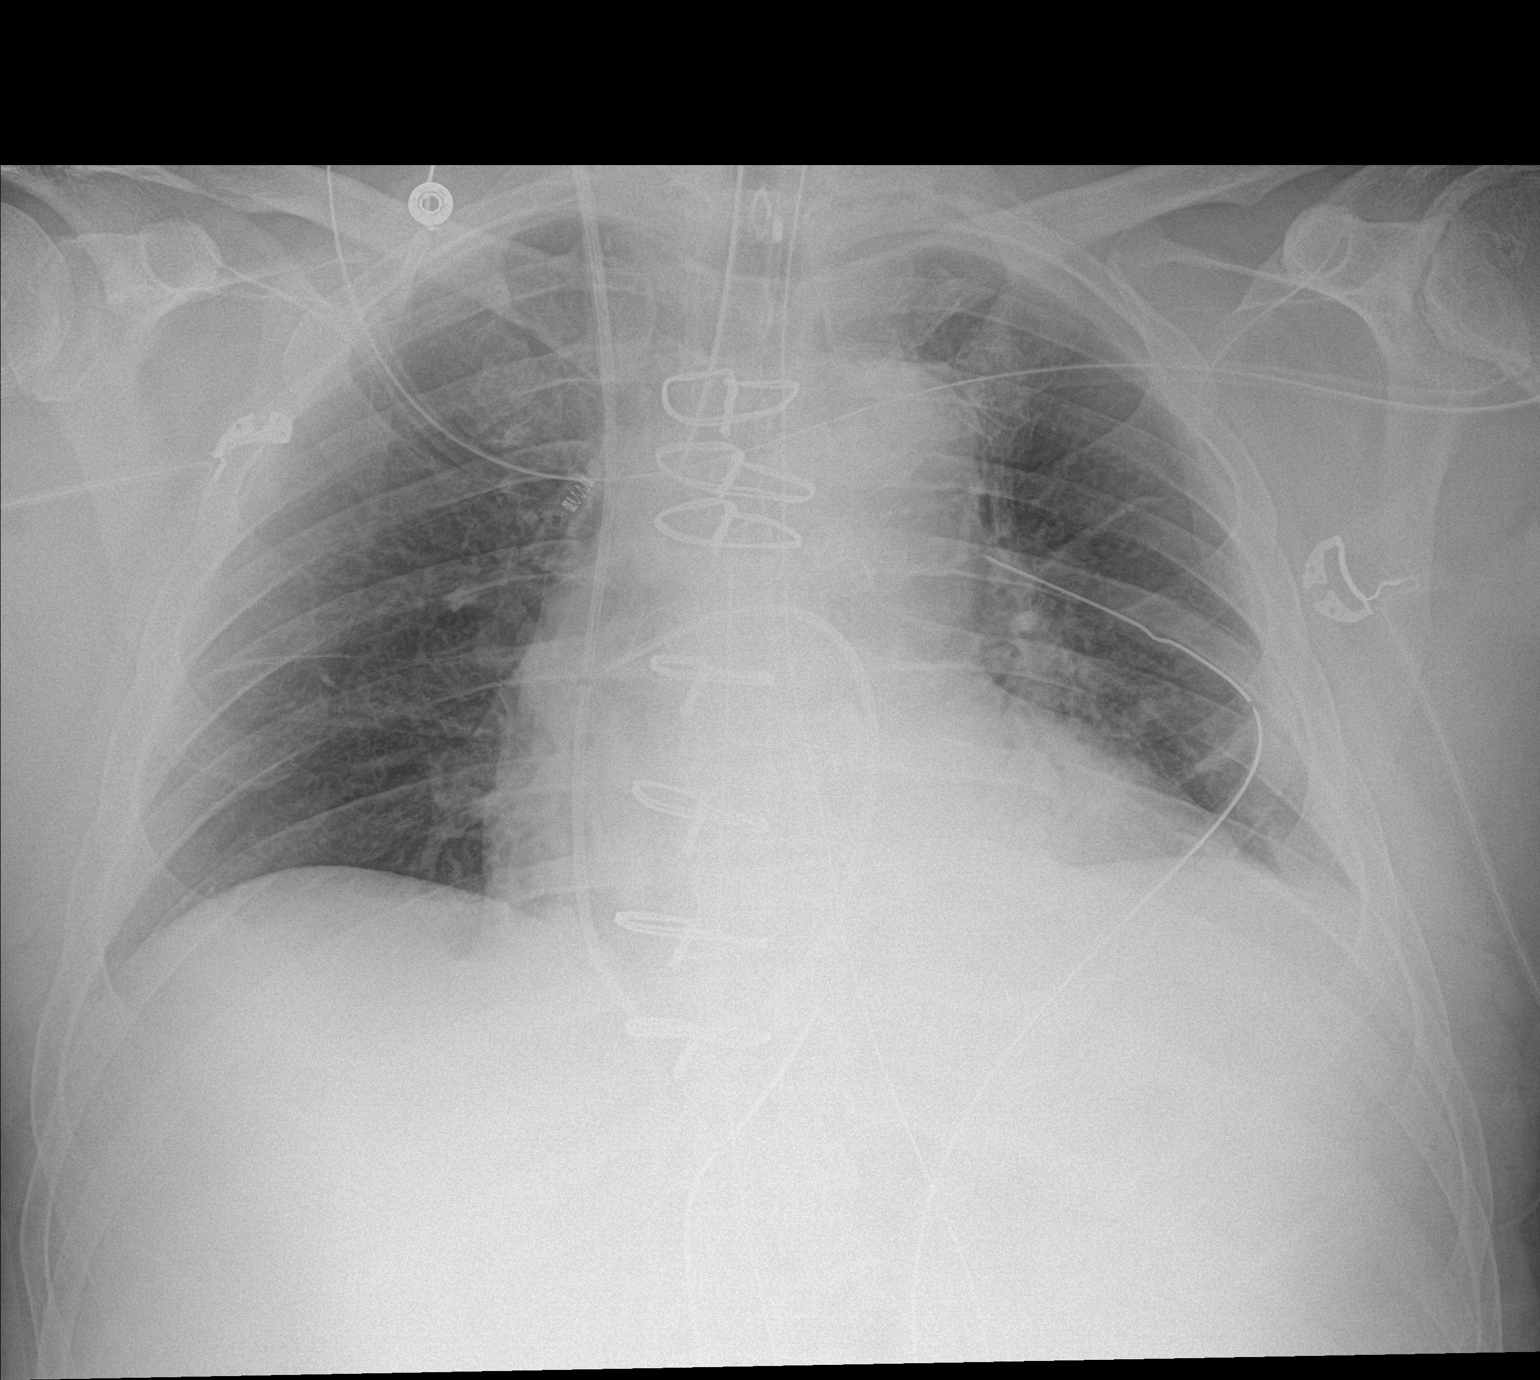

[1 of 1 positions shown; findings below may reference images not displayed]

FINDINGS: Endotracheal tube placed, tip about 7 mm above the carina, consider
retracting 2 cm.

Nasogastric tube enters the stomach. Mediastinal drain and
left-sided chest tube in place. Swan-Ganz catheter noted with tip in
the right descending pulmonary artery.

Low lung volumes. Indistinct left lower lobe airspace opacity in the
retrocardiac position.

Continued mild cardiomegaly. No overt edema. No visible
pneumothorax. Tortuous thoracic aorta.
IMPRESSION: 1. Endotracheal tube is somewhat low in position, only about 7 mm
above the carina. Consider retracting 2 cm.
2. The remainder of the support apparatus appear satisfactorily
positioned.
3. Continued mild cardiomegaly.  Low lung volumes.
4. Indistinct left lower lobe airspace opacity in the retrocardiac
position is nonspecific but probably from atelectasis given the
crowding of bronchi. Surveillance suggested.
These results will be called to the ordering clinician or
representative by the Radiologist Assistant, and communication
documented in the PACS or zVision Dashboard.

## 2017-03-07 IMAGING — CR DG CHEST 1V PORT
1 series · 1 of 1 positions shown · non-contrast
Comparison: Portable exam 8565 hours compared to 0540 hours

CLINICAL DATA: Re-intubation

EXAM:
PORTABLE CHEST 1 VIEW

[AP]
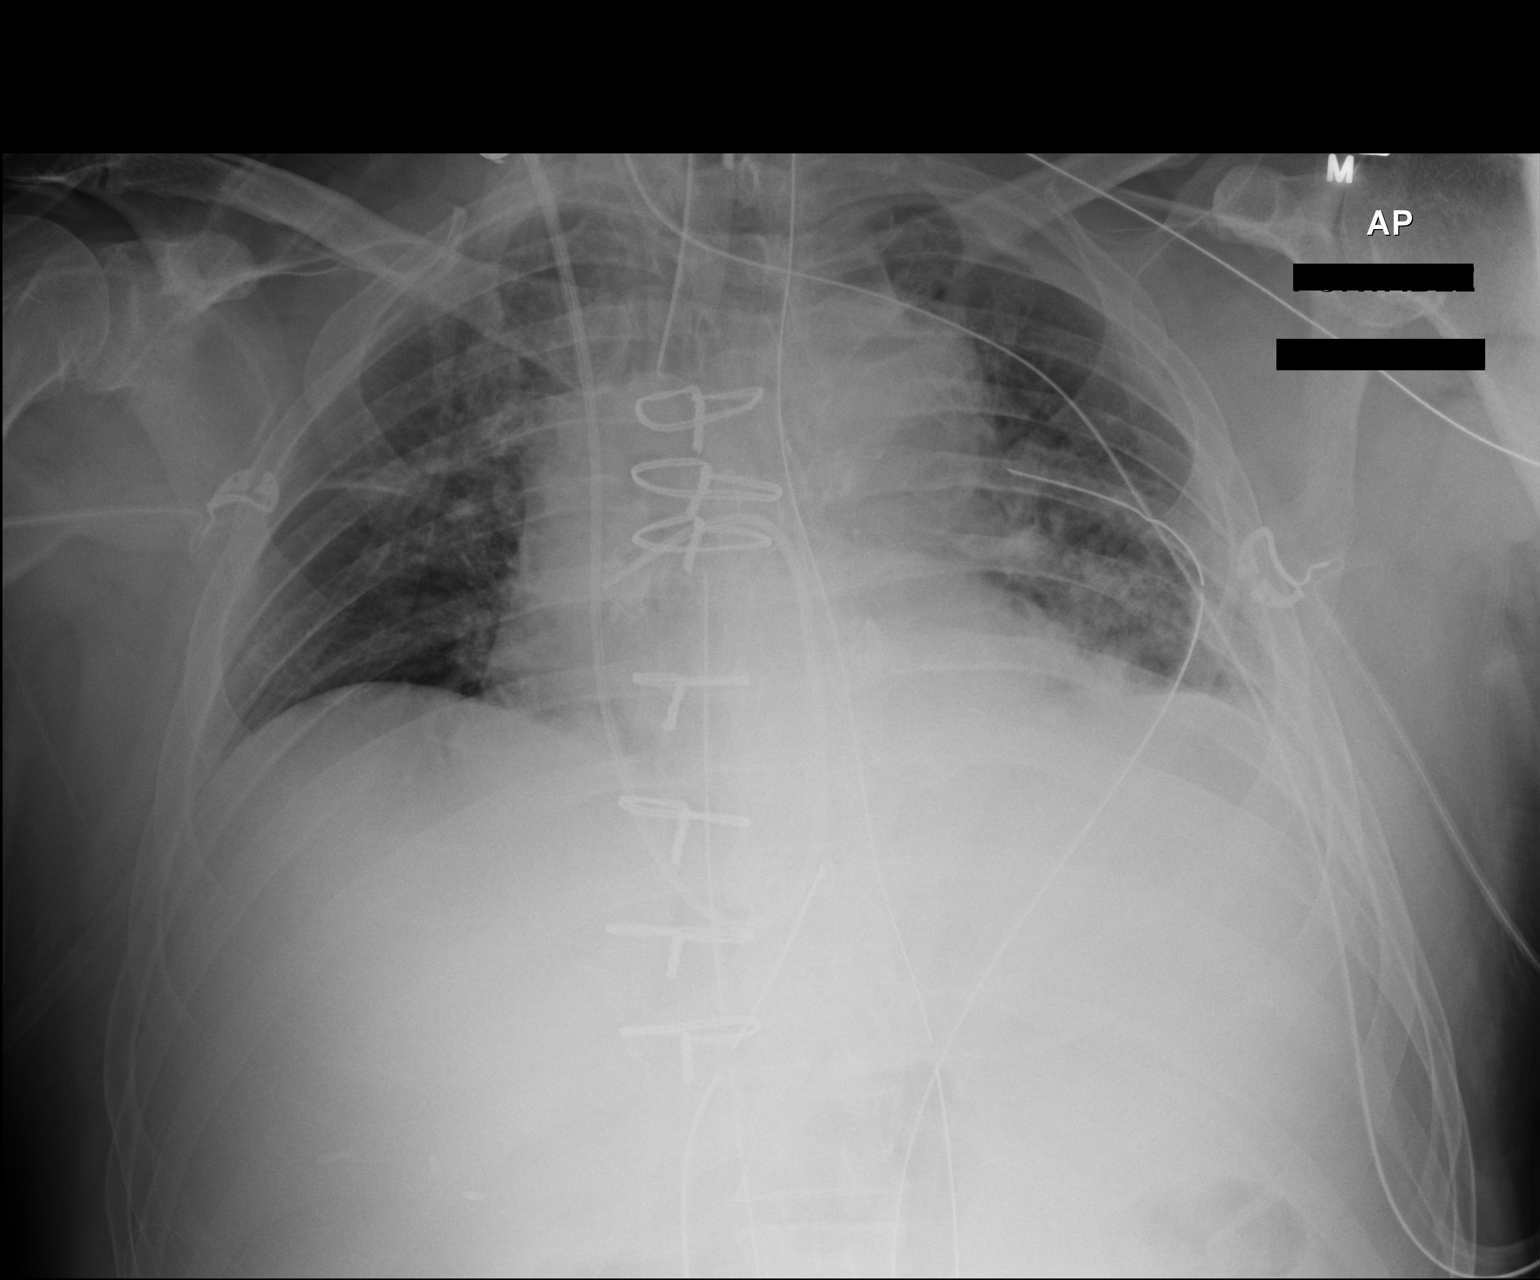

[1 of 1 positions shown; findings below may reference images not displayed]

FINDINGS: Tip of endotracheal tube projects approximately 2.5 cm above carina.

Nasogastric tube extends into stomach.

RIGHT jugular Swan-Ganz catheter with tip projecting over descending
interlobar pulmonary artery.

Mediastinal drain and LEFT thoracostomy tube identified.

Enlargement of cardiac silhouette post median sternotomy.

Persistent LEFT basilar infiltrate.

No definite pleural effusion or pneumothorax field
IMPRESSION: Tip of endotracheal tube projects approximately 2.5 cm above carina.

Low lung volumes with persistent LEFT basilar infiltrate.

## 2017-03-09 ENCOUNTER — Encounter: Payer: Self-pay | Admitting: Cardiology

## 2017-03-09 ENCOUNTER — Ambulatory Visit: Payer: Medicare HMO | Admitting: Cardiology

## 2017-03-09 VITALS — BP 182/104 | HR 61 | Resp 97 | Ht 69.0 in | Wt 211.0 lb

## 2017-03-09 DIAGNOSIS — E782 Mixed hyperlipidemia: Secondary | ICD-10-CM

## 2017-03-09 DIAGNOSIS — R0602 Shortness of breath: Secondary | ICD-10-CM

## 2017-03-09 DIAGNOSIS — I10 Essential (primary) hypertension: Secondary | ICD-10-CM | POA: Diagnosis not present

## 2017-03-09 DIAGNOSIS — I2510 Atherosclerotic heart disease of native coronary artery without angina pectoris: Secondary | ICD-10-CM

## 2017-03-09 MED ORDER — AMLODIPINE BESYLATE 10 MG PO TABS: 10.0000 mg | ORAL_TABLET | Freq: Every day | ORAL | 3 refills | Status: DC

## 2017-03-09 NOTE — Patient Instructions (Addendum)
Medication Instructions:  INCREASE NORVASC TO 10 MG DAILY   Labwork: I WILL REQUEST LABS FORM PCP.   Testing/Procedures: Your physician has requested that you have an echocardiogram. Echocardiography is a painless test that uses sound waves to create images of your heart. It provides your doctor with information about the size and shape of your heart and how well your heart's chambers and valves are working. This procedure takes approximately one hour. There are no restrictions for this procedure.    Follow-Up: Your physician wants you to follow-up in: 6 MONTHS.  You will receive a reminder letter in the mail two months in advance. If you don't receive a letter, please call our office to schedule the follow-up appointment.   Any Other Special Instructions Will Be Listed Below (If Applicable). Please keep a blood pressure log for 1 week. You may call with the readings or drop them off by the office.   If you need a refill on your cardiac medications before your next appointment, please call your pharmacy.

## 2017-03-09 NOTE — Progress Notes (Signed)
Clinical Summary [name] is a 66 y.o.male seen today for follow up of the following medical problems.   1. CAD - history of LIMA-LAD and SVG-OM2 CABG 01/2016. LVEF 50-55% by TEE at the time - no exertional chest pain. Can have some SOB x 3 months. No edema. No orthopnea   2. HTN - checks at home occasionally, typically 140s/80s-90s. Has not taken meds yet today.   3. Hyperlipidemia - lipitor caused muscle aches. Simvastatin caused side effects. Tolerating pravaststin ok.    Past Medical History:  Diagnosis Date  . Adenomatous colon polyp 08/2010  . Chronic back pain   . Coronary artery disease   . Diabetes mellitus without complication ([place])   . GERD (gastroesophageal reflux disease)   . Hiatal hernia   . HTN (hypertension)   . Hyperlipidemia   . Renal cyst    Large Left  . Restless legs   . Sleep apnea    uses cpap     Allergies  Allergen Reactions  . Other     Medication for insomnia  . Simvastatin     REACTION: arm numbness Patient currently on this medication (03/2012).     Current Outpatient Medications  Medication Sig Dispense Refill  . amLODipine (NORVASC) 5 MG tablet Take 5 mg by mouth daily.    Marland Kitchen aspirin EC 325 MG EC tablet Take 1 tablet (325 mg total) by mouth daily. 30 tablet 0  . furosemide (LASIX) 40 MG tablet Take 1 tablet (40 mg total) by mouth daily. 7 tablet 0  . lisinopril (PRINIVIL,ZESTRIL) 40 MG tablet Take 1 tablet (40 mg total) by mouth daily. 90 tablet 3  . metFORMIN (GLUCOPHAGE) 500 MG tablet Take 500 mg by mouth 2 (two) times daily with a meal.    . metoprolol tartrate (LOPRESSOR) 25 MG tablet Take 0.5 tablets (12.5 mg total) by mouth 2 (two) times daily. 30 tablet 1  . mometasone (NASONEX) 50 MCG/ACT nasal spray Place 2 sprays into the nose daily as needed (for nasal congestion).    . pantoprazole (PROTONIX) 40 MG tablet Take 40 mg by mouth daily.    . pravastatin (PRAVACHOL) 20 MG tablet Take 1 tablet (20 mg total) by mouth  every evening. 90 tablet 3  . triamterene-hydrochlorothiazide (DYAZIDE) 37.5-25 MG capsule Take 1 capsule by mouth daily.     No current facility-administered medications for this visit.      Past Surgical History:  Procedure Laterality Date  . APPENDECTOMY  1967  . CARDIOVASCULAR STRESS TEST  2004   Treadmill Stress Test  . CARDIOVASCULAR STRESS TEST  2010  . CHOLECYSTECTOMY  07/2006  . COLONOSCOPY  08/2010   RMR: Cecal tubular adenoma removed, otherwise normal exam. next tcs 08/2015  . KNEE SURGERY  1995   Micro  . UPPER GASTROINTESTINAL ENDOSCOPY  08/2010   RMR: GERD benign biopsy  . VASECTOMY       Allergies  Allergen Reactions  . Other     Medication for insomnia  . Simvastatin     REACTION: arm numbness Patient currently on this medication (03/2012).      Family History  Problem Relation Age of Onset  . Lymphoma Mother   . Ulcers Father   . Ulcers Paternal Grandfather        Stomach ulcer, resection  . Heart attack Other   . Diabetes Other      Social History [name] reports that  has never smoked. he has never used  smokeless tobacco. [name] reports that he drinks about 1.2 oz of alcohol per week.   Review of Systems CONSTITUTIONAL: No weight loss, fever, chills, weakness or fatigue.  HEENT: Eyes: No visual loss, blurred vision, double vision or yellow sclerae.No hearing loss, sneezing, congestion, runny nose or sore throat.  SKIN: No rash or itching.  CARDIOVASCULAR: per hpi RESPIRATORY: per hpi GASTROINTESTINAL: No anorexia, nausea, vomiting or diarrhea. No abdominal pain or blood.  GENITOURINARY: No burning on urination, no polyuria NEUROLOGICAL: No headache, dizziness, syncope, paralysis, ataxia, numbness or tingling in the extremities. No change in bowel or bladder control.  MUSCULOSKELETAL: No muscle, back pain, joint pain or stiffness.  LYMPHATICS: No enlarged nodes. No history of splenectomy.  PSYCHIATRIC: No history of depression or  anxiety.  ENDOCRINOLOGIC: No reports of sweating, cold or heat intolerance. No polyuria or polydipsia.  Marland Kitchen   Physical Examination Vitals:   03/09/17 0858  BP: (!) 182/104  Pulse: 61  Resp: (!) 97   Vitals:   03/09/17 0858  Weight: 211 lb (95.7 kg)  Height: 5\' 9"  (1.753 m)    Gen: resting comfortably, no acute distress HEENT: no scleral icterus, pupils equal round and reactive, no palptable cervical adenopathy,  CV: RRR, no mr/g, no jvd Resp: Clear to auscultation bilaterally GI: abdomen is soft, non-tender, non-distended, normal bowel sounds, no hepatosplenomegaly MSK: extremities are warm, no edema.  Skin: warm, no rash Neuro:  no focal deficits Psych: appropriate affect   Diagnostic Studies  01/2014 Echo Study Conclusions  - Left ventricle: The cavity size was normal. Wall thickness was increased in a pattern of mild LVH. Systolic function was normal. The estimated ejection fraction was in the range of 55% to 60%. Wall motion was normal; there were no regional wall motion abnormalities. Doppler parameters are consistent with abnormal left ventricular relaxation (grade 1 diastolic dysfunction). Doppler parameters are consistent with elevated mean left atrial filling pressure. Medial E/e&' 12.4. - Left atrium: The atrium was moderately dilated. - Right atrium: The atrium was mildly dilated. - Pulmonic valve: There was mild regurgitation.   01/2014 Lexiscan MPI IMPRESSION:  1. Abnormal study. Perfusion imaging suggests mild region of  ischemia in the mid apical inferolateral wall. The baseline GXT was  however low risk with Duke treadmill score of 8.5, and hypertensive  response.  2. No focal LV wall motion abnormalities by gated imaging.  3. Left ventricular ejection fraction 44% although appears better  visually.  4. Overall low-risk stress test findings*.   07/2015 Exercise MPI  No diagnostic ST segment changes. Occasional PVCs noted during  exercise, more frequent in recovery. Nonlimiting chest pain reported with hypertensive response. Intermediate risk Duke treadmill score of 3.5.  Small, moderate intensity, mid to apical inferolateral defect that is reversible consistent with ischemia.  This is an intermediate risk study.  Nuclear stress EF: 37%, appears higher visually. Suggest echocardiogram to further assess.  01/2016 cath  Ost Cx lesion, 75 %stenosed.  LM lesion, 50 %stenosed.  The left ventricular systolic function is normal.  The left ventricular ejection fraction is 50-55% by visual estimate.  LV end diastolic pressure is mildly elevated.    Eccentric 70-80% proximal/ostial circumflex. Circumflex territory is large.  50% stenosis in the distal left main.  Codominant right coronary  Overall normal LV function with mild elevation and end-diastolic pressure.   Assessment and Plan  1. CAD - no recent chest pain, has noticed some recent SOB. We will order an echo to evaluate.  2. HTN - above goal, increase norvasc to 10mg  daily  3. Hyperlipidemia - side effects to more potent statins, tolerating pravastatin - continue current therapy. Request labs from pcp      Arnoldo Lenis, M.D.

## 2017-03-14 ENCOUNTER — Encounter: Payer: Self-pay | Admitting: Cardiology

## 2017-04-04 ENCOUNTER — Ambulatory Visit (HOSPITAL_COMMUNITY)
Admission: RE | Admit: 2017-04-04 | Discharge: 2017-04-04 | Disposition: A | Discharge status: Home / Self Care | Payer: Medicare HMO | Source: Ambulatory Visit | Attending: Cardiology | Admitting: Cardiology

## 2017-04-04 DIAGNOSIS — I10 Essential (primary) hypertension: Secondary | ICD-10-CM | POA: Insufficient documentation

## 2017-04-04 DIAGNOSIS — K219 Gastro-esophageal reflux disease without esophagitis: Secondary | ICD-10-CM | POA: Diagnosis not present

## 2017-04-04 DIAGNOSIS — R0602 Shortness of breath: Secondary | ICD-10-CM | POA: Insufficient documentation

## 2017-04-04 DIAGNOSIS — E119 Type 2 diabetes mellitus without complications: Secondary | ICD-10-CM | POA: Insufficient documentation

## 2017-04-04 DIAGNOSIS — G4730 Sleep apnea, unspecified: Secondary | ICD-10-CM | POA: Insufficient documentation

## 2017-04-04 DIAGNOSIS — E785 Hyperlipidemia, unspecified: Secondary | ICD-10-CM | POA: Diagnosis not present

## 2017-04-04 NOTE — Progress Notes (Signed)
*  PRELIMINARY RESULTS* Echocardiogram 2D Echocardiogram has been performed.  Thomas [name] 04/04/2017, 9:11 AM

## 2017-04-11 ENCOUNTER — Telehealth: Payer: Self-pay

## 2017-04-11 NOTE — Telephone Encounter (Signed)
Called pt,. No answer. Left message for pt. To return call.

## 2017-04-11 NOTE — Telephone Encounter (Signed)
-----   Message from Arnoldo Lenis, MD sent at 04/11/2017 11:27 AM EST ----- Echo shows heart pumping function is just mildly below normal, this can cause some SOB at times and is probably related to his previous blockages. How is his breathing doing since last visit?   Zandra Abts MD

## 2017-04-12 ENCOUNTER — Telehealth: Payer: Self-pay

## 2017-04-12 NOTE — Telephone Encounter (Signed)
Called pt., no answer. Left message for pt to return call.  

## 2017-04-12 NOTE — Telephone Encounter (Signed)
-----   Message from Arnoldo Lenis, MD sent at 04/11/2017 11:27 AM EST ----- Echo shows heart pumping function is just mildly below normal, this can cause some SOB at times and is probably related to his previous blockages. How is his breathing doing since last visit?   Zandra Abts MD

## 2017-04-13 IMAGING — CR DG CHEST 2V
2 series · 2 of 2 positions shown · non-contrast
Comparison: 03/01/2016

CLINICAL DATA: Previous CABG 5 weeks ago.  Follow-up.

EXAM:
CHEST  2 VIEW

[w chest pa]
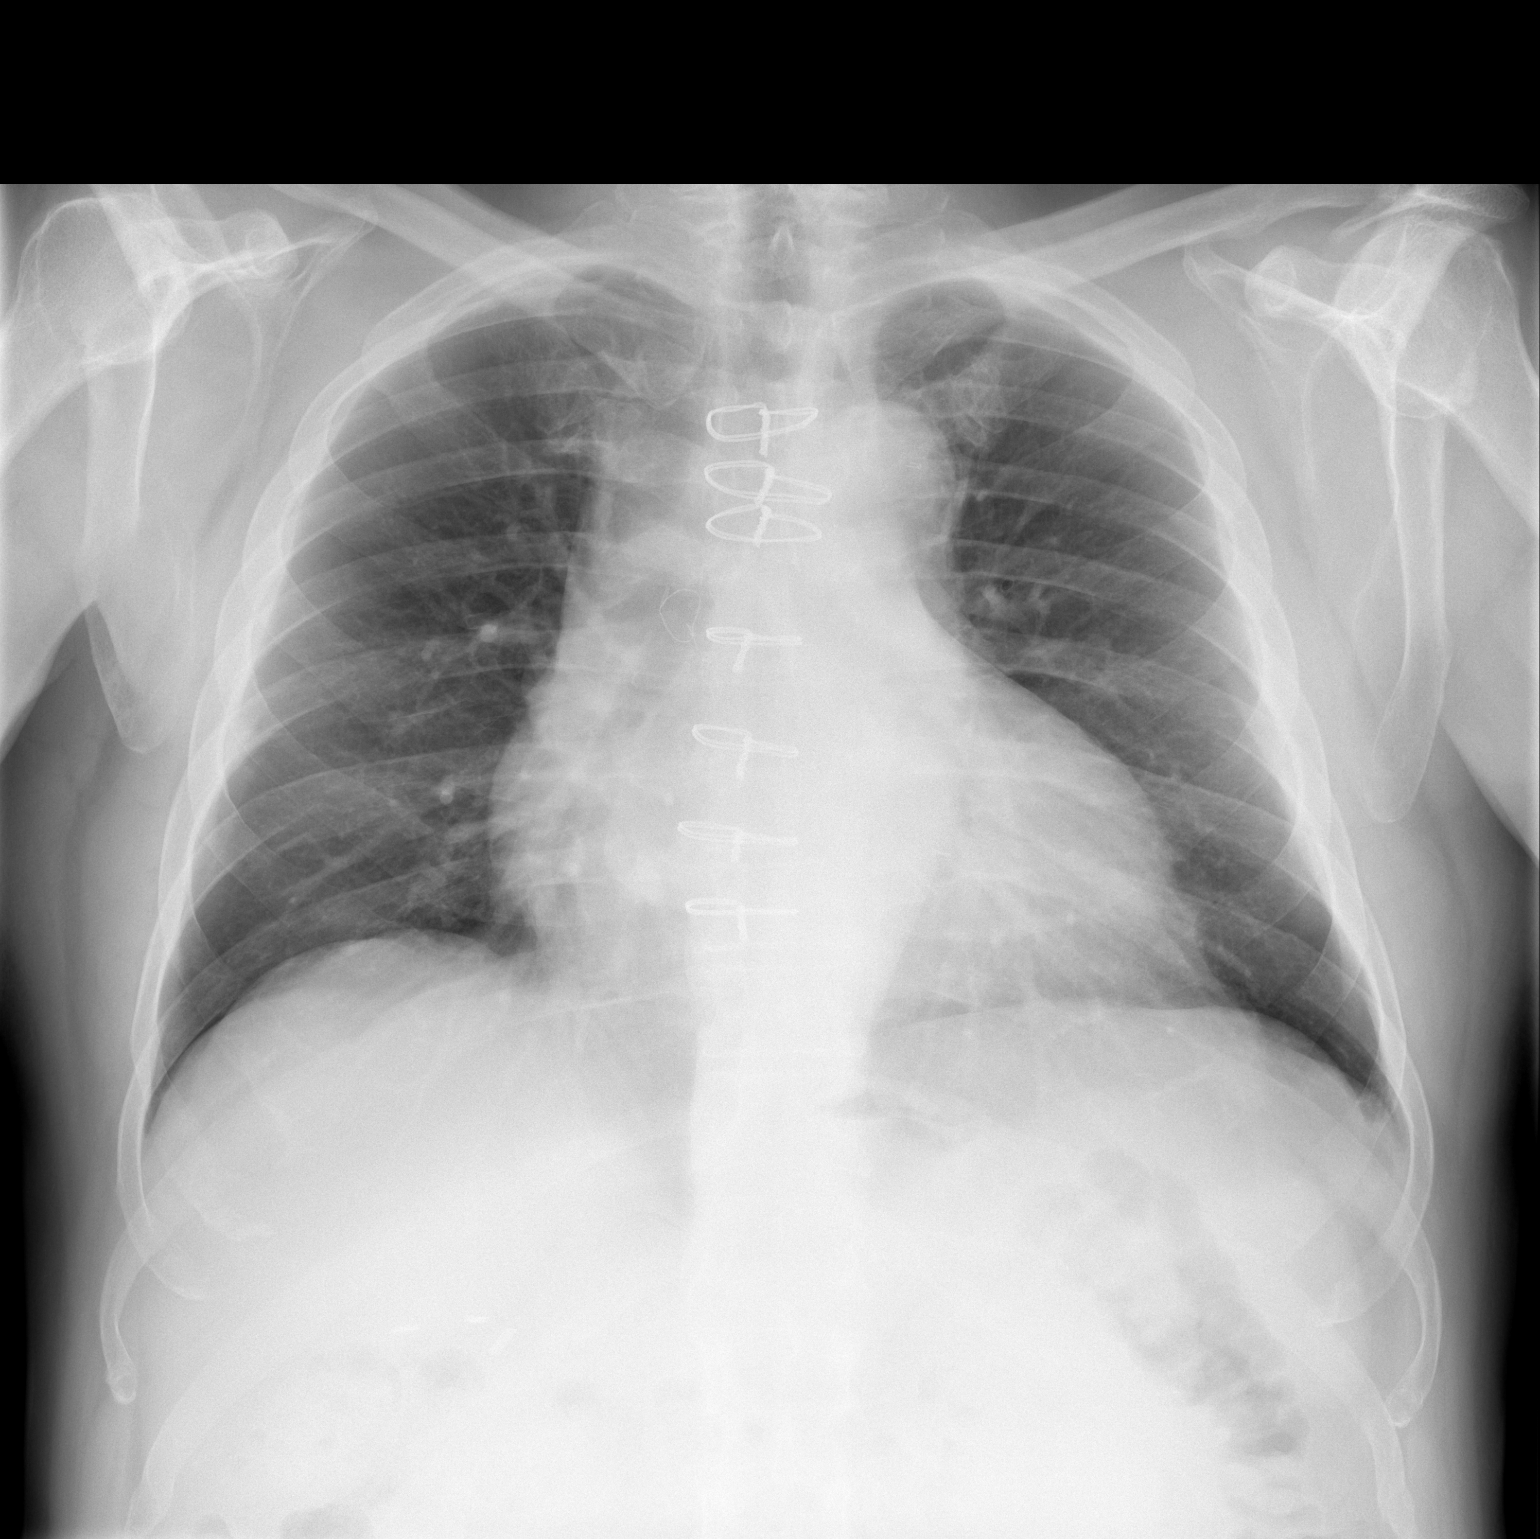

[w chest lat]
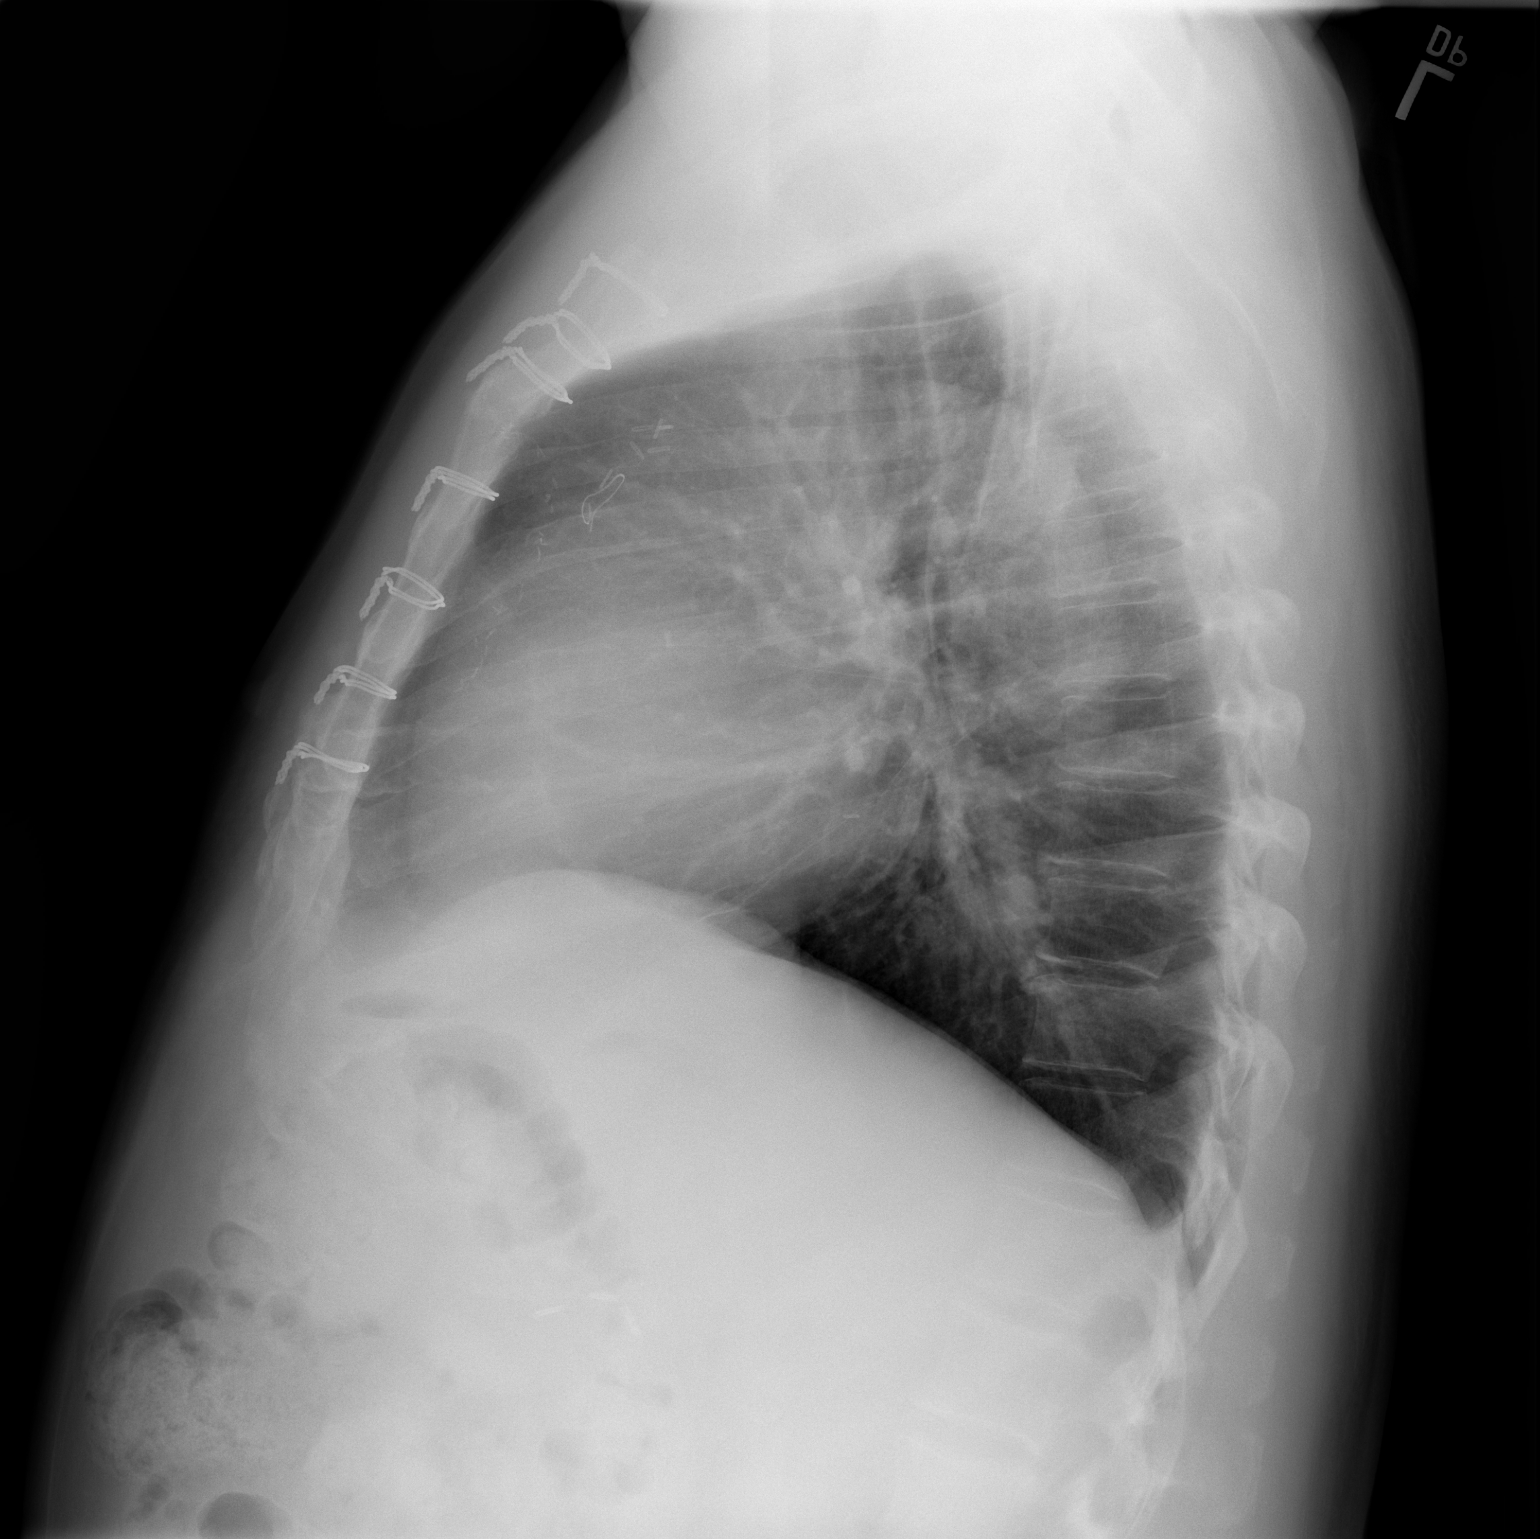

[2 of 2 positions shown; findings below may reference images not displayed]

FINDINGS: Previous median sternotomy. Chronically enlarged cardiac silhouette.
Aortic atherosclerosis. The lungs are clear. No edema or effusions.
No bone abnormality.
IMPRESSION: Previous CABG. No active finding. Chronic cardiomegaly and aortic
atherosclerosis.

## 2017-04-16 ENCOUNTER — Telehealth: Payer: Self-pay

## 2017-04-16 NOTE — Telephone Encounter (Signed)
Spoke with pt. Let him know results of test.

## 2017-04-17 ENCOUNTER — Telehealth: Payer: Self-pay

## 2017-04-17 NOTE — Telephone Encounter (Signed)
Spoke with pt., he states that his breathing is about the same. He did state that he will have to visit with his PCP to discuss getting his CPAP titrated. He states that with the cooler temperatures its not as bad.

## 2017-04-17 NOTE — Telephone Encounter (Signed)
-----   Message from Arnoldo Lenis, MD sent at 04/11/2017 11:27 AM EST ----- Echo shows heart pumping function is just mildly below normal, this can cause some SOB at times and is probably related to his previous blockages. How is his breathing doing since last visit?   Zandra Abts MD

## 2017-04-18 NOTE — Telephone Encounter (Signed)
Pt made aware, sent message to front office to schedule for 6 weeks.

## 2017-04-18 NOTE — Telephone Encounter (Signed)
F/u 6 weeks to reevalute symptoms and see if any further cardiac testing is indicated. Have him call us earlier if symptoms worsen   Zandra Abts MD

## 2017-05-11 DIAGNOSIS — R351 Nocturia: Secondary | ICD-10-CM | POA: Diagnosis not present

## 2017-05-11 DIAGNOSIS — R972 Elevated prostate specific antigen [PSA]: Secondary | ICD-10-CM | POA: Diagnosis not present

## 2017-05-11 DIAGNOSIS — D649 Anemia, unspecified: Secondary | ICD-10-CM | POA: Diagnosis not present

## 2017-05-11 DIAGNOSIS — I10 Essential (primary) hypertension: Secondary | ICD-10-CM | POA: Diagnosis not present

## 2017-05-11 DIAGNOSIS — L989 Disorder of the skin and subcutaneous tissue, unspecified: Secondary | ICD-10-CM | POA: Diagnosis not present

## 2017-05-11 DIAGNOSIS — Z6831 Body mass index (BMI) 31.0-31.9, adult: Secondary | ICD-10-CM | POA: Diagnosis not present

## 2017-05-11 DIAGNOSIS — M7041 Prepatellar bursitis, right knee: Secondary | ICD-10-CM | POA: Diagnosis not present

## 2017-05-11 DIAGNOSIS — E782 Mixed hyperlipidemia: Secondary | ICD-10-CM | POA: Diagnosis not present

## 2017-05-11 DIAGNOSIS — E1165 Type 2 diabetes mellitus with hyperglycemia: Secondary | ICD-10-CM | POA: Diagnosis not present

## 2017-05-11 DIAGNOSIS — G9009 Other idiopathic peripheral autonomic neuropathy: Secondary | ICD-10-CM | POA: Diagnosis not present

## 2017-05-16 DIAGNOSIS — K219 Gastro-esophageal reflux disease without esophagitis: Secondary | ICD-10-CM | POA: Diagnosis not present

## 2017-05-16 DIAGNOSIS — L989 Disorder of the skin and subcutaneous tissue, unspecified: Secondary | ICD-10-CM | POA: Diagnosis not present

## 2017-05-16 DIAGNOSIS — D509 Iron deficiency anemia, unspecified: Secondary | ICD-10-CM | POA: Diagnosis not present

## 2017-05-16 DIAGNOSIS — E119 Type 2 diabetes mellitus without complications: Secondary | ICD-10-CM | POA: Diagnosis not present

## 2017-05-16 DIAGNOSIS — G9009 Other idiopathic peripheral autonomic neuropathy: Secondary | ICD-10-CM | POA: Diagnosis not present

## 2017-05-16 DIAGNOSIS — I10 Essential (primary) hypertension: Secondary | ICD-10-CM | POA: Diagnosis not present

## 2017-05-16 DIAGNOSIS — R1011 Right upper quadrant pain: Secondary | ICD-10-CM | POA: Diagnosis not present

## 2017-05-16 DIAGNOSIS — E785 Hyperlipidemia, unspecified: Secondary | ICD-10-CM | POA: Diagnosis not present

## 2017-05-16 DIAGNOSIS — J069 Acute upper respiratory infection, unspecified: Secondary | ICD-10-CM | POA: Diagnosis not present

## 2017-05-16 DIAGNOSIS — R7301 Impaired fasting glucose: Secondary | ICD-10-CM | POA: Diagnosis not present

## 2017-05-18 ENCOUNTER — Other Ambulatory Visit (HOSPITAL_COMMUNITY): Payer: Self-pay | Admitting: Internal Medicine

## 2017-05-18 DIAGNOSIS — R1011 Right upper quadrant pain: Secondary | ICD-10-CM

## 2017-05-18 DIAGNOSIS — R198 Other specified symptoms and signs involving the digestive system and abdomen: Secondary | ICD-10-CM

## 2017-05-24 ENCOUNTER — Encounter: Payer: Self-pay | Admitting: Cardiology

## 2017-05-24 ENCOUNTER — Ambulatory Visit: Payer: Medicare HMO | Admitting: Cardiology

## 2017-05-24 VITALS — BP 118/68 | HR 61 | Ht 69.0 in | Wt 211.0 lb

## 2017-05-24 DIAGNOSIS — I10 Essential (primary) hypertension: Secondary | ICD-10-CM

## 2017-05-24 DIAGNOSIS — I2510 Atherosclerotic heart disease of native coronary artery without angina pectoris: Secondary | ICD-10-CM

## 2017-05-24 DIAGNOSIS — G4733 Obstructive sleep apnea (adult) (pediatric): Secondary | ICD-10-CM | POA: Diagnosis not present

## 2017-05-24 DIAGNOSIS — E782 Mixed hyperlipidemia: Secondary | ICD-10-CM

## 2017-05-24 DIAGNOSIS — R0602 Shortness of breath: Secondary | ICD-10-CM

## 2017-05-24 NOTE — Progress Notes (Signed)
Clinical Summary Thomas [name] is a 67 y.o.male seen today for follow up of the following medical problems.   1. CAD - history of LIMA-LAD and SVG-OM2 CABG 01/2016. LVEF 50-55% by TEE at the time - 03/2017 echo LVEF 45-50%. Grade Ii diastolic dysfunction.   - still with some SOB at times. Mildly improved from last visit. Intermittent sharp pain left chest to mid back. Can be positional at times.    2. HTN - compliatn with meds  3. Hyperlipidemia - lipitor caused muscle aches. Simvastatin caused side effects. Tolerating pravaststin ok.  - recent labs with pcp he reports   4. SOB - 01/2016 PFTs showed evidence of air trapping without specific obstruction.  - no smoking history   Past Medical History:  Diagnosis Date  . Adenomatous colon polyp 08/2010  . Chronic back pain   . Coronary artery disease   . Diabetes mellitus without complication ([place])   . GERD (gastroesophageal reflux disease)   . Hiatal hernia   . HTN (hypertension)   . Hyperlipidemia   . Renal cyst    Large Left  . Restless legs   . Sleep apnea    uses cpap     Allergies  Allergen Reactions  . Other     Medication for insomnia  . Simvastatin     REACTION: arm numbness Patient currently on this medication (03/2012).     Current Outpatient Medications  Medication Sig Dispense Refill  . amLODipine (NORVASC) 10 MG tablet Take 1 tablet (10 mg total) daily by mouth. 180 tablet 3  . aspirin EC 81 MG tablet Take 81 mg daily by mouth.    . furosemide (LASIX) 40 MG tablet Take 1 tablet (40 mg total) by mouth daily. 7 tablet 0  . lisinopril (PRINIVIL,ZESTRIL) 40 MG tablet Take 1 tablet (40 mg total) by mouth daily. 90 tablet 3  . metFORMIN (GLUCOPHAGE) 500 MG tablet Take 500 mg by mouth 2 (two) times daily with a meal.    . metoprolol tartrate (LOPRESSOR) 25 MG tablet Take 0.5 tablets (12.5 mg total) by mouth 2 (two) times daily. (Patient taking differently: Take 25 mg 2 (two) times daily by mouth. ) 30  tablet 1  . mometasone (NASONEX) 50 MCG/ACT nasal spray Place 2 sprays into the nose daily as needed (for nasal congestion).    . pantoprazole (PROTONIX) 40 MG tablet Take 40 mg by mouth daily.    . pravastatin (PRAVACHOL) 20 MG tablet Take 1 tablet (20 mg total) by mouth every evening. 90 tablet 3  . triamterene-hydrochlorothiazide (DYAZIDE) 37.5-25 MG capsule Take 1 capsule by mouth daily.     No current facility-administered medications for this visit.      Past Surgical History:  Procedure Laterality Date  . APPENDECTOMY  1967  . CARDIAC CATHETERIZATION N/A 01/31/2016   Procedure: Left Heart Cath and Coronary Angiography;  Surgeon: Belva Crome, MD;  Location: [facility];  Service: Cardiovascular;  Laterality: N/A;  . CARDIOVASCULAR STRESS TEST  2004   Treadmill Stress Test  . CARDIOVASCULAR STRESS TEST  2010  . CHOLECYSTECTOMY  07/2006  . COLONOSCOPY  08/2010   RMR: Cecal tubular adenoma removed, otherwise normal exam. next tcs 08/2015  . CORONARY ARTERY BYPASS GRAFT N/A 02/28/2016   Procedure: CORONARY ARTERY BYPASS GRAFTING x two using left internal mammary artery and right leg greater saphenous vein harvested endoscopically;  Surgeon: Gaye Pollack, MD;  Location: [facility];  Service: Open Heart Surgery;  Laterality: N/A;  . [place]  . TEE WITHOUT CARDIOVERSION N/A 02/28/2016   Procedure: TRANSESOPHAGEAL ECHOCARDIOGRAM (TEE);  Surgeon: Gaye Pollack, MD;  Location: [facility];  Service: Open Heart Surgery;  Laterality: N/A;  . UPPER GASTROINTESTINAL ENDOSCOPY  08/2010   RMR: GERD benign biopsy  . VASECTOMY       Allergies  Allergen Reactions  . Other     Medication for insomnia  . Simvastatin     REACTION: arm numbness Patient currently on this medication (03/2012).      Family History  Problem Relation Age of Onset  . Lymphoma Mother   . Ulcers Father   . Ulcers Paternal Grandfather        Stomach ulcer, resection  . Heart attack Other   .  Diabetes Other      Social History Mr. Shepperson reports that  has never smoked. he has never used smokeless tobacco. Mr. Nicotra reports that he drinks about 1.2 oz of alcohol per week.   Review of Systems CONSTITUTIONAL: No weight loss, fever, chills, weakness or fatigue.  HEENT: Eyes: No visual loss, blurred vision, double vision or yellow sclerae.No hearing loss, sneezing, congestion, runny nose or sore throat.  SKIN: No rash or itching.  CARDIOVASCULAR: per hpi RESPIRATORY: per hpi GASTROINTESTINAL: No anorexia, nausea, vomiting or diarrhea. No abdominal pain or blood.  GENITOURINARY: No burning on urination, no polyuria NEUROLOGICAL: No headache, dizziness, syncope, paralysis, ataxia, numbness or tingling in the extremities. No change in bowel or bladder control.  MUSCULOSKELETAL: No muscle, back pain, joint pain or stiffness.  LYMPHATICS: No enlarged nodes. No history of splenectomy.  PSYCHIATRIC: No history of depression or anxiety.  ENDOCRINOLOGIC: No reports of sweating, cold or heat intolerance. No polyuria or polydipsia.  Marland Kitchen   Physical Examination Vitals:   05/24/17 1334  BP: 118/68  Pulse: 61  SpO2: 97%   Vitals:   05/24/17 1334  Weight: 211 lb (95.7 kg)  Height: 5\' 9"  (1.753 m)    Gen: resting comfortably, no acute distress HEENT: no scleral icterus, pupils equal round and reactive, no palptable cervical adenopathy,  CV: RRR, no m/r/g, no jvd Resp: Clear to auscultation bilaterally GI: abdomen is soft, non-tender, non-distended, normal bowel sounds, no hepatosplenomegaly MSK: extremities are warm, no edema.  Skin: warm, no rash Neuro:  no focal deficits Psych: appropriate affect   Diagnostic Studies 01/2014 Echo Study Conclusions  - Left ventricle: The cavity size was normal. Wall thickness was increased in a pattern of mild LVH. Systolic function was normal. The estimated ejection fraction was in the range of 55% to 60%. Wall motion was normal; there  were no regional wall motion abnormalities. Doppler parameters are consistent with abnormal left ventricular relaxation (grade 1 diastolic dysfunction). Doppler parameters are consistent with elevated mean left atrial filling pressure. Medial E/e&' 12.4. - Left atrium: The atrium was moderately dilated. - Right atrium: The atrium was mildly dilated. - Pulmonic valve: There was mild regurgitation.   01/2014 Lexiscan MPI IMPRESSION:  1. Abnormal study. Perfusion imaging suggests mild region of  ischemia in the mid apical inferolateral wall. The baseline GXT was  however low risk with Duke treadmill score of 8.5, and hypertensive  response.  2. No focal LV wall motion abnormalities by gated imaging.  3. Left ventricular ejection fraction 44% although appears better  visually.  4. Overall low-risk stress test findings*.   07/2015 Exercise MPI  No diagnostic ST segment changes. Occasional PVCs  noted during exercise, more frequent in recovery. Nonlimiting chest pain reported with hypertensive response. Intermediate risk Duke treadmill score of 3.5.  Small, moderate intensity, mid to apical inferolateral defect that is reversible consistent with ischemia.  This is an intermediate risk study.  Nuclear stress EF: 37%, appears higher visually. Suggest echocardiogram to further assess.  01/2016 cath  Ost Cx lesion, 75 %stenosed.  LM lesion, 50 %stenosed.  The left ventricular systolic function is normal.  The left ventricular ejection fraction is 50-55% by visual estimate.  LV end diastolic pressure is mildly elevated.   Eccentric 70-80% proximal/ostial circumflex. Circumflex territory is large.  50% stenosis in the distal left main.  Codominant right coronary  Overall normal LV function with mild elevation and end-diastolic pressure.   03/2017 echo Study Conclusions  - Left ventricle: The cavity size was normal. Wall thickness was   increased in a  pattern of mild LVH. Systolic function was mildly   reduced. The estimated ejection fraction was in the range of 45%   to 50%. Diffuse hypokinesis. Features are consistent with a   pseudonormal left ventricular filling pattern, with concomitant   abnormal relaxation and increased filling pressure (grade 2   diastolic dysfunction). Doppler parameters are consistent with   high ventricular filling pressure. - Left atrium: The atrium was mildly dilated. - Pulmonary arteries: PA peak pressure: 31 mm Hg (S).  Impressions:  - Left ventricular systolic function appeared better in some views   and worse on others. Overall, LVEF appears low normal to mildly   reduced, 45-50%.  Assessment and Plan   1. CAD - no recent chest pain. Continue current meds.  - occasional SOB not specifically cardiac in description.  2. HTN - at goal, continue current meds  3. Hyperlipidemia - side effects to more potent statins, tolerating pravastatin - continue current meds  4. SOB Nonspecific abnormal PFTs. Symptos are improving, if reoccur may have him see pulmonary.    Arnoldo Lenis, M.D

## 2017-05-24 NOTE — Patient Instructions (Signed)
Medication Instructions:  TAKE LASIX 40 MG DAILY AS NEEDED FOR SWELLING   Labwork: I WILL REQUEST LABS FROM PCP  Testing/Procedures: NONE  Follow-Up: Your physician recommends that you schedule a follow-up appointment in: 4 MONTHS    Any Other Special Instructions Will Be Listed Below (If Applicable).     If you need a refill on your cardiac medications before your next appointment, please call your pharmacy.

## 2017-05-28 ENCOUNTER — Ambulatory Visit (HOSPITAL_COMMUNITY): Admission: RE | Admit: 2017-05-28 | Payer: Medicare HMO | Source: Ambulatory Visit

## 2017-05-30 ENCOUNTER — Encounter: Payer: Self-pay | Admitting: Cardiology

## 2017-06-01 ENCOUNTER — Ambulatory Visit: Payer: Medicare HMO | Admitting: Cardiology

## 2017-06-08 ENCOUNTER — Ambulatory Visit (HOSPITAL_COMMUNITY)
Admission: RE | Admit: 2017-06-08 | Discharge: 2017-06-08 | Disposition: A | Discharge status: Home / Self Care | Payer: Medicare HMO | Source: Ambulatory Visit | Attending: Internal Medicine | Admitting: Internal Medicine

## 2017-06-08 DIAGNOSIS — K760 Fatty (change of) liver, not elsewhere classified: Secondary | ICD-10-CM | POA: Insufficient documentation

## 2017-06-08 DIAGNOSIS — R1011 Right upper quadrant pain: Secondary | ICD-10-CM | POA: Diagnosis present

## 2017-06-08 DIAGNOSIS — R198 Other specified symptoms and signs involving the digestive system and abdomen: Secondary | ICD-10-CM

## 2017-06-08 DIAGNOSIS — N281 Cyst of kidney, acquired: Secondary | ICD-10-CM | POA: Diagnosis not present

## 2017-09-03 DIAGNOSIS — G4733 Obstructive sleep apnea (adult) (pediatric): Secondary | ICD-10-CM | POA: Diagnosis not present

## 2017-09-11 ENCOUNTER — Encounter: Payer: Self-pay | Admitting: Cardiology

## 2017-09-11 ENCOUNTER — Ambulatory Visit: Payer: Medicare HMO | Admitting: Cardiology

## 2017-09-11 VITALS — BP 126/76 | HR 67 | Ht 69.0 in | Wt 214.0 lb

## 2017-09-11 DIAGNOSIS — I10 Essential (primary) hypertension: Secondary | ICD-10-CM

## 2017-09-11 DIAGNOSIS — R079 Chest pain, unspecified: Secondary | ICD-10-CM | POA: Diagnosis not present

## 2017-09-11 DIAGNOSIS — E782 Mixed hyperlipidemia: Secondary | ICD-10-CM | POA: Diagnosis not present

## 2017-09-11 DIAGNOSIS — I2510 Atherosclerotic heart disease of native coronary artery without angina pectoris: Secondary | ICD-10-CM | POA: Diagnosis not present

## 2017-09-11 DIAGNOSIS — R0789 Other chest pain: Secondary | ICD-10-CM

## 2017-09-11 DIAGNOSIS — R600 Localized edema: Secondary | ICD-10-CM

## 2017-09-11 MED ORDER — FUROSEMIDE 40 MG PO TABS: 40.0000 mg | ORAL_TABLET | Freq: Every day | ORAL | 3 refills | Status: DC | PRN

## 2017-09-11 MED ORDER — LISINOPRIL 5 MG PO TABS: 5.0000 mg | ORAL_TABLET | Freq: Every day | ORAL | 3 refills | Status: DC

## 2017-09-11 NOTE — Patient Instructions (Signed)
Medication Instructions:  TAKE LASIX 40 MG DAILY AS NEEDED FOR SWELLING  START LISINOPRIL 5 MG DAILY   Labwork: 2 WEEKS  BMET MAGNESIUM   Testing/Procedures: Your physician has requested that you have en exercise stress myoview. For further information please visit [email or web address]. Please follow instruction sheet, as given.      Follow-Up: Your physician recommends that you schedule a follow-up appointment in: 1 MONTH    Any Other Special Instructions Will Be Listed Below (If Applicable).     If you need a refill on your cardiac medications before your next appointment, please call your pharmacy.

## 2017-09-11 NOTE — Progress Notes (Signed)
Clinical Summary Mr. Grudzien is a 67 y.o.male seen today for follow up of the following medical problems.  1.CAD - history of LIMA-LAD and SVG-OM2 CABG 01/2016. LVEF 50-55% by TEE at the time - 03/2017 echo LVEF 45-50%. Grade Ii diastolic dysfunction.    - has had some recent chest pain off and on. Feeling in midchest, cramping like feeling up to 5/10 in severity. Radiates to midback. Could occur at rest or exertion. +SOB with episodes. Can be worst with moving arm. Could be constant over 5-6 hours.  - somewhat better with gabapentin. No relation with food.  - highest physical activity is cutting grass with push mower, can have some discomfort.  - on average happens 4 times a week.  - can have some palpitations.   2. HTN - he is compliant with meds  3. Hyperlipidemia - lipitor caused muscle aches. Simvastatin caused side effects. Tolerating pravaststin ok.  - recent labs with pcp he reports   4. SOB - 01/2016 PFTs showed evidence of air trapping without specific obstruction.  - no smoking history   SH: upcoming trip to [place], [place].  Past Medical History:  Diagnosis Date  . Adenomatous colon polyp 08/2010  . Chronic back pain   . Coronary artery disease   . Diabetes mellitus without complication ([place])   . GERD (gastroesophageal reflux disease)   . Hiatal hernia   . HTN (hypertension)   . Hyperlipidemia   . Renal cyst    Large Left  . Restless legs   . Sleep apnea    uses cpap     Allergies  Allergen Reactions  . Other     Medication for insomnia  . Simvastatin     REACTION: arm numbness Patient currently on this medication (03/2012).     Current Outpatient Medications  Medication Sig Dispense Refill  . amLODipine (NORVASC) 10 MG tablet Take 1 tablet (10 mg total) daily by mouth. 180 tablet 3  . aspirin EC 81 MG tablet Take 81 mg daily by mouth.    . furosemide (LASIX) 40 MG tablet Take 1 tablet (40 mg total) by mouth daily. (Patient taking  differently: Take 40 mg by mouth daily as needed. ) 7 tablet 0  . lisinopril (PRINIVIL,ZESTRIL) 40 MG tablet Take 1 tablet (40 mg total) by mouth daily. 90 tablet 3  . metFORMIN (GLUCOPHAGE) 500 MG tablet Take 500 mg by mouth 2 (two) times daily with a meal.    . metoprolol tartrate (LOPRESSOR) 25 MG tablet take 25 mg two times daily  1  . mometasone (NASONEX) 50 MCG/ACT nasal spray Place 2 sprays into the nose daily as needed (for nasal congestion).    . pantoprazole (PROTONIX) 40 MG tablet Take 40 mg by mouth daily.    . pravastatin (PRAVACHOL) 20 MG tablet Take 1 tablet (20 mg total) by mouth every evening. 90 tablet 3  . triamterene-hydrochlorothiazide (DYAZIDE) 37.5-25 MG capsule Take 1 capsule by mouth daily.     No current facility-administered medications for this visit.      Past Surgical History:  Procedure Laterality Date  . APPENDECTOMY  1967  . CARDIAC CATHETERIZATION N/A 01/31/2016   Procedure: Left Heart Cath and Coronary Angiography;  Surgeon: Belva Crome, MD;  Location: [facility];  Service: Cardiovascular;  Laterality: N/A;  . CARDIOVASCULAR STRESS TEST  2004   Treadmill Stress Test  . CARDIOVASCULAR STRESS TEST  2010  . CHOLECYSTECTOMY  07/2006  . COLONOSCOPY  08/2010   RMR: Cecal tubular adenoma removed, otherwise normal exam. next tcs 08/2015  . CORONARY ARTERY BYPASS GRAFT N/A 02/28/2016   Procedure: CORONARY ARTERY BYPASS GRAFTING x two using left internal mammary artery and right leg greater saphenous vein harvested endoscopically;  Surgeon: Gaye Pollack, MD;  Location: [facility];  Service: Open Heart Surgery;  Laterality: N/A;  . [place]  . TEE WITHOUT CARDIOVERSION N/A 02/28/2016   Procedure: TRANSESOPHAGEAL ECHOCARDIOGRAM (TEE);  Surgeon: Gaye Pollack, MD;  Location: [facility];  Service: Open Heart Surgery;  Laterality: N/A;  . UPPER GASTROINTESTINAL ENDOSCOPY  08/2010   RMR: GERD benign biopsy  . VASECTOMY       Allergies  Allergen  Reactions  . Other     Medication for insomnia  . Simvastatin     REACTION: arm numbness Patient currently on this medication (03/2012).      Family History  Problem Relation Age of Onset  . Lymphoma Mother   . Ulcers Father   . Ulcers Paternal Grandfather        Stomach ulcer, resection  . Heart attack Other   . Diabetes Other      Social History Mr. Havey reports that he has never smoked. He has never used smokeless tobacco. Mr. Crumpler reports that he drinks about 1.2 oz of alcohol per week.   Review of Systems CONSTITUTIONAL: No weight loss, fever, chills, weakness or fatigue.  HEENT: Eyes: No visual loss, blurred vision, double vision or yellow sclerae.No hearing loss, sneezing, congestion, runny nose or sore throat.  SKIN: No rash or itching.  CARDIOVASCULAR: per hpi RESPIRATORY: per hpi GASTROINTESTINAL: No anorexia, nausea, vomiting or diarrhea. No abdominal pain or blood.  GENITOURINARY: No burning on urination, no polyuria NEUROLOGICAL: No headache, dizziness, syncope, paralysis, ataxia, numbness or tingling in the extremities. No change in bowel or bladder control.  MUSCULOSKELETAL: No muscle, back pain, joint pain or stiffness.  LYMPHATICS: No enlarged nodes. No history of splenectomy.  PSYCHIATRIC: No history of depression or anxiety.  ENDOCRINOLOGIC: No reports of sweating, cold or heat intolerance. No polyuria or polydipsia.  Marland Kitchen   Physical Examination Vitals:   09/11/17 0901  BP: 126/76  Pulse: 67  SpO2: 97%   Vitals:   09/11/17 0901  Weight: 214 lb (97.1 kg)  Height: 5\' 9"  (1.753 m)    Gen: resting comfortably, no acute distress HEENT: no scleral icterus, pupils equal round and reactive, no palptable cervical adenopathy,  CV: RRR, no m/r/g, no jvd Resp: Clear to auscultation bilaterally GI: abdomen is soft, non-tender, non-distended, normal bowel sounds, no hepatosplenomegaly MSK: extremities are warm, no edema.  Skin: warm, no rash Neuro:   no focal deficits Psych: appropriate affect   Diagnostic Studies 01/2014 Echo Study Conclusions  - Left ventricle: The cavity size was normal. Wall thickness was increased in a pattern of mild LVH. Systolic function was normal. The estimated ejection fraction was in the range of 55% to 60%. Wall motion was normal; there were no regional wall motion abnormalities. Doppler parameters are consistent with abnormal left ventricular relaxation (grade 1 diastolic dysfunction). Doppler parameters are consistent with elevated mean left atrial filling pressure. Medial E/e&' 12.4. - Left atrium: The atrium was moderately dilated. - Right atrium: The atrium was mildly dilated. - Pulmonic valve: There was mild regurgitation.     01/2014 Lexiscan MPI IMPRESSION:  1. Abnormal study. Perfusion imaging suggests mild region of  ischemia in the mid apical inferolateral  wall. The baseline GXT was  however low risk with Duke treadmill score of 8.5, and hypertensive  response.  2. No focal LV wall motion abnormalities by gated imaging.  3. Left ventricular ejection fraction 44% although appears better  visually.  4. Overall low-risk stress test findings*.   07/2015 Exercise MPI  No diagnostic ST segment changes. Occasional PVCs noted during exercise, more frequent in recovery. Nonlimiting chest pain reported with hypertensive response. Intermediate risk Duke treadmill score of 3.5.  Small, moderate intensity, mid to apical inferolateral defect that is reversible consistent with ischemia.  This is an intermediate risk study.  Nuclear stress EF: 37%, appears higher visually. Suggest echocardiogram to further assess.  01/2016 cath  Ost Cx lesion, 75 %stenosed.  LM lesion, 50 %stenosed.  The left ventricular systolic function is normal.  The left ventricular ejection fraction is 50-55% by visual estimate.  LV end diastolic pressure is mildly elevated.   Eccentric 70-80%  proximal/ostial circumflex. Circumflex territory is large.  50% stenosis in the distal left main.  Codominant right coronary  Overall normal LV function with mild elevation and end-diastolic pressure.   03/2017 echo Study Conclusions  - Left ventricle: The cavity size was normal. Wall thickness was increased in a pattern of mild LVH. Systolic function was mildly reduced. The estimated ejection fraction was in the range of 45% to 50%. Diffuse hypokinesis. Features are consistent with a pseudonormal left ventricular filling pattern, with concomitant abnormal relaxation and increased filling pressure (grade 2 diastolic dysfunction). Doppler parameters are consistent with high ventricular filling pressure. - Left atrium: The atrium was mildly dilated. - Pulmonary arteries: PA peak pressure: 31 mm Hg (S).  Impressions:  - Left ventricular systolic function appeared better in some views and worse on others. Overall, LVEF appears low normal to mildly reduced, 45-50%.  01/2016 PFTs Overinflation without specific obstruction   Assessment and Plan  1.CAD - recent chest pain symptoms, we will plan for an exercise nuclear stress test  - in setting of CAD start lisinopril 5mg  daily, check BMET/Mg in 2 weeks.   2. HTN -at goal, continue current meds  3. Hyperlipidemia - side effects to more potent statins, tolerating pravastatin - we will continue current statin.   4. LE edema - start lasix 40mg  prn, check BMET/Mg in 2 weeks.    F/u 1 month    Arnoldo Lenis, M.D.

## 2017-09-16 ENCOUNTER — Encounter: Payer: Self-pay | Admitting: Cardiology

## 2017-09-25 ENCOUNTER — Encounter (HOSPITAL_COMMUNITY): Payer: Medicare HMO

## 2017-09-28 ENCOUNTER — Encounter (HOSPITAL_COMMUNITY): Payer: Self-pay

## 2017-09-28 ENCOUNTER — Encounter (HOSPITAL_COMMUNITY)
Admission: RE | Admit: 2017-09-28 | Discharge: 2017-09-28 | Disposition: A | Discharge status: Home / Self Care | Payer: Medicare HMO | Source: Ambulatory Visit | Attending: Cardiology | Admitting: Cardiology

## 2017-09-28 ENCOUNTER — Encounter (HOSPITAL_BASED_OUTPATIENT_CLINIC_OR_DEPARTMENT_OTHER)
Admission: RE | Admit: 2017-09-28 | Discharge: 2017-09-28 | Disposition: A | Discharge status: Home / Self Care | Payer: Medicare HMO | Source: Ambulatory Visit | Attending: Cardiology | Admitting: Cardiology

## 2017-09-28 DIAGNOSIS — R079 Chest pain, unspecified: Secondary | ICD-10-CM | POA: Insufficient documentation

## 2017-09-28 LAB — NM MYOCAR MULTI W/SPECT W/WALL MOTION / EF
CHL CUP MPHR: 153 {beats}/min
CHL CUP NUCLEAR SSS: 0
CHL CUP RESTING HR STRESS: 56 {beats}/min
CHL RATE OF PERCEIVED EXERTION: 16
CSEPED: 8 min
CSEPHR: 92 %
Estimated workload: 10.1 METS
Exercise duration (sec): 35 s
LV sys vol: 76 mL
LVDIAVOL: 135 mL (ref 62–150)
Peak HR: 141 {beats}/min
RATE: 0.44
SDS: 0
SRS: 0
TID: 0.94

## 2017-09-28 MED ORDER — SODIUM CHLORIDE 0.9% FLUSH
INTRAVENOUS | Status: AC
  Administered 2017-09-28: 10 mL via INTRAVENOUS
  Filled 2017-09-28: qty 10

## 2017-09-28 MED ORDER — TECHNETIUM TC 99M TETROFOSMIN IV KIT
10.0000 | PACK | Freq: Once | INTRAVENOUS | Status: AC | PRN
  Administered 2017-09-28: 10.7 via INTRAVENOUS

## 2017-09-28 MED ORDER — TECHNETIUM TC 99M TETROFOSMIN IV KIT
30.0000 | PACK | Freq: Once | INTRAVENOUS | Status: AC | PRN
  Administered 2017-09-28: 32 via INTRAVENOUS

## 2017-09-28 MED ORDER — REGADENOSON 0.4 MG/5ML IV SOLN
INTRAVENOUS | Status: AC
  Filled 2017-09-28: qty 5

## 2017-10-02 ENCOUNTER — Telehealth: Payer: Self-pay

## 2017-10-02 NOTE — Telephone Encounter (Signed)
Called pt. No answer, Left message for pt top return call.

## 2017-10-02 NOTE — Telephone Encounter (Signed)
-----   Message from Arnoldo Lenis, MD sent at 10/02/2017  3:23 PM EDT ----- Stress test overall does not show any new blockages. How are his symptoms doing?   Zandra Abts MD

## 2017-10-03 ENCOUNTER — Telehealth: Payer: Self-pay

## 2017-10-03 NOTE — Telephone Encounter (Signed)
Can I see him Tues at 50 in [place], we will need to go through the symptoms again in detail and decide if we need to consider a cath vs just medication chagnes   Zandra Abts MD

## 2017-10-03 NOTE — Telephone Encounter (Signed)
-----   Message from Arnoldo Lenis, MD sent at 10/02/2017  3:23 PM EDT ----- Stress test overall does not show any new blockages. How are his symptoms doing?   Zandra Abts MD

## 2017-10-03 NOTE — Telephone Encounter (Signed)
Spoke with pt. Informed him of results. He states that he is not feeling much better. He is still having those sharp pains across his chest, still feeling really tired without having to do a lot of anything. He is also having extra swelling in his feet. He will await return call to see what other testing can be done. Please advise

## 2017-10-04 NOTE — Telephone Encounter (Signed)
Pt notified. Vicky scheduled @ [facility].

## 2017-10-09 ENCOUNTER — Ambulatory Visit: Payer: Medicare HMO | Admitting: Cardiology

## 2017-10-09 ENCOUNTER — Encounter: Payer: Self-pay | Admitting: Cardiology

## 2017-10-09 ENCOUNTER — Telehealth: Payer: Self-pay | Admitting: Cardiology

## 2017-10-09 VITALS — BP 130/78 | HR 62 | Ht 69.0 in | Wt 216.0 lb

## 2017-10-09 DIAGNOSIS — R600 Localized edema: Secondary | ICD-10-CM

## 2017-10-09 DIAGNOSIS — R0602 Shortness of breath: Secondary | ICD-10-CM

## 2017-10-09 DIAGNOSIS — I25119 Atherosclerotic heart disease of native coronary artery with unspecified angina pectoris: Secondary | ICD-10-CM

## 2017-10-09 DIAGNOSIS — I10 Essential (primary) hypertension: Secondary | ICD-10-CM | POA: Diagnosis not present

## 2017-10-09 DIAGNOSIS — E782 Mixed hyperlipidemia: Secondary | ICD-10-CM | POA: Diagnosis not present

## 2017-10-09 MED ORDER — FUROSEMIDE 40 MG PO TABS: 60.0000 mg | ORAL_TABLET | ORAL | 1 refills | Status: DC | PRN

## 2017-10-09 MED ORDER — ISOSORBIDE MONONITRATE ER 30 MG PO TB24: 15.0000 mg | ORAL_TABLET | Freq: Every day | ORAL | 1 refills | Status: DC

## 2017-10-09 NOTE — Patient Instructions (Addendum)
Your physician recommends that you schedule a follow-up appointment in: [place] has recommended you make the following change in your medication:   CHANGE LASIX 60 MG AS NEEDED  START IMDUR 15 MG (1/2 TABLET) DAILY  Please update [place] on your weights on Friday   Your physician has requested that you have an echocardiogram. Echocardiography is a painless test that uses sound waves to create images of your heart. It provides your doctor with information about the size and shape of your heart and how well your heart's chambers and valves are working. This procedure takes approximately one hour. There are no restrictions for this procedure.  Thank you for choosing [facility]!!

## 2017-10-09 NOTE — Progress Notes (Signed)
Clinical Summary Thomas [name] is a 67 y.o.male seen today for follow up of the following medical problems.  1.CAD - history of LIMA-LAD and SVG-OM2 CABG 01/2016. LVEF 50-55% by TEE at the time - 03/2017 echo LVEF 45-50%. Grade Ii diastolic dysfunction.   - has had some recent chest pain off and on. Feeling in midchest, cramping like feeling up to 5/10 in severity. Radiates to midback. Could occur at rest or exertion. +SOB with episodes. Can be worst with moving arm. Could be constant over 5-6 hours.  - somewhat better with gabapentin. No relation with food.  - highest physical activity is cutting grass with push mower, can have some discomfort.  - on average happens 4 times a week.  - can have some palpitations.   08/2017 nuclear stress: PVCs, short runs NSVT. Small mild intensity mid to basal inferolatearl defect fixed most consistent with scar. LVEF 44%  - since last visit still having chest pain. Midchest, can get a pressure. Can occur at anytime. Lasts about 30 minutes, can have some mild SOB with episodes. DOE with activities. Occurs roughly every 2 days. - working in his yard. Using push mower can get symptoms. Can have increased fatigue. Stable over last month.   2. LE edema - increased edema. Taking lasix 40mg  prn, on daily. New onset in early May.  - weight up Jan 2019 211 to 216 lbs.  - checks home weights  3. Irregular heart beat - noted on his vitals machine.  - PVCs noted on stress test.    4. HTN - he is compliant with meds - home bp's 120s/50s  3. Hyperlipidemia - lipitor caused muscle aches. Simvastatin caused side effects. Tolerating pravaststin ok.  - recent labs with pcphe reports   4. SOB - 01/2016 PFTs showed evidence of air trapping without specific obstruction.  - no smoking history   SH: upcoming trip to [place], [place].    Past Medical History:  Diagnosis Date  . Adenomatous colon polyp 08/2010  . Chronic back pain   . Coronary  artery disease   . Diabetes mellitus without complication ([place])   . GERD (gastroesophageal reflux disease)   . Hiatal hernia   . HTN (hypertension)   . Hyperlipidemia   . Renal cyst    Large Left  . Restless legs   . Sleep apnea    uses cpap     Allergies  Allergen Reactions  . Other     Medication for insomnia  . Simvastatin     REACTION: arm numbness Patient currently on this medication (03/2012).     Current Outpatient Medications  Medication Sig Dispense Refill  . amLODipine (NORVASC) 10 MG tablet Take 1 tablet (10 mg total) daily by mouth. 180 tablet 3  . aspirin EC 81 MG tablet Take 81 mg daily by mouth.    . furosemide (LASIX) 40 MG tablet Take 1 tablet (40 mg total) by mouth daily as needed. 90 tablet 3  . lisinopril (PRINIVIL,ZESTRIL) 5 MG tablet Take 1 tablet (5 mg total) by mouth daily. 90 tablet 3  . metFORMIN (GLUCOPHAGE) 500 MG tablet Take 500 mg by mouth 2 (two) times daily with a meal.    . metoprolol tartrate (LOPRESSOR) 25 MG tablet take 25 mg two times daily  1  . mometasone (NASONEX) 50 MCG/ACT nasal spray Place 2 sprays into the nose daily as needed (for nasal congestion).    . pantoprazole (PROTONIX) 40 MG tablet Take 40 mg  by mouth daily.    . pravastatin (PRAVACHOL) 20 MG tablet Take 1 tablet (20 mg total) by mouth every evening. 90 tablet 3  . triamterene-hydrochlorothiazide (DYAZIDE) 37.5-25 MG capsule Take 1 capsule by mouth daily.     No current facility-administered medications for this visit.      Past Surgical History:  Procedure Laterality Date  . APPENDECTOMY  1967  . CARDIAC CATHETERIZATION N/A 01/31/2016   Procedure: Left Heart Cath and Coronary Angiography;  Surgeon: Belva Crome, MD;  Location: [facility];  Service: Cardiovascular;  Laterality: N/A;  . CARDIOVASCULAR STRESS TEST  2004   Treadmill Stress Test  . CARDIOVASCULAR STRESS TEST  2010  . CHOLECYSTECTOMY  07/2006  . COLONOSCOPY  08/2010   RMR: Cecal tubular adenoma  removed, otherwise normal exam. next tcs 08/2015  . CORONARY ARTERY BYPASS GRAFT N/A 02/28/2016   Procedure: CORONARY ARTERY BYPASS GRAFTING x two using left internal mammary artery and right leg greater saphenous vein harvested endoscopically;  Surgeon: Gaye Pollack, MD;  Location: [facility];  Service: Open Heart Surgery;  Laterality: N/A;  . [place]  . TEE WITHOUT CARDIOVERSION N/A 02/28/2016   Procedure: TRANSESOPHAGEAL ECHOCARDIOGRAM (TEE);  Surgeon: Gaye Pollack, MD;  Location: [facility];  Service: Open Heart Surgery;  Laterality: N/A;  . UPPER GASTROINTESTINAL ENDOSCOPY  08/2010   RMR: GERD benign biopsy  . VASECTOMY       Allergies  Allergen Reactions  . Other     Medication for insomnia  . Simvastatin     REACTION: arm numbness Patient currently on this medication (03/2012).      Family History  Problem Relation Age of Onset  . Lymphoma Mother   . Ulcers Father   . Ulcers Paternal Grandfather        Stomach ulcer, resection  . Heart attack Other   . Diabetes Other      Social History Thomas [name] reports that he has never smoked. He has never used smokeless tobacco. Thomas [name] reports that he drinks about 1.2 oz of alcohol per week.   Review of Systems CONSTITUTIONAL: No weight loss, fever, chills, weakness or fatigue.  HEENT: Eyes: No visual loss, blurred vision, double vision or yellow sclerae.No hearing loss, sneezing, congestion, runny nose or sore throat.  SKIN: No rash or itching.  CARDIOVASCULAR:per hpi  RESPIRATORY: per hpi GASTROINTESTINAL: No anorexia, nausea, vomiting or diarrhea. No abdominal pain or blood.  GENITOURINARY: No burning on urination, no polyuria NEUROLOGICAL: No headache, dizziness, syncope, paralysis, ataxia, numbness or tingling in the extremities. No change in bowel or bladder control.  MUSCULOSKELETAL: No muscle, back pain, joint pain or stiffness.  LYMPHATICS: No enlarged nodes. No history of splenectomy.  PSYCHIATRIC:  No history of depression or anxiety.  ENDOCRINOLOGIC: No reports of sweating, cold or heat intolerance. No polyuria or polydipsia.  Marland Kitchen   Physical Examination Vitals:   10/09/17 0951  BP: 130/78  Pulse: 62  SpO2: 96%   Vitals:   10/09/17 0951  Weight: 216 lb (98 kg)  Height: 5\' 9"  (1.753 m)    Gen: resting comfortably, no acute distress HEENT: no scleral icterus, pupils equal round and reactive, no palptable cervical adenopathy,  CV: RRR, no m/r/g, no jvd Resp: Clear to auscultation bilaterally GI: abdomen is soft, non-tender, non-distended, normal bowel sounds, no hepatosplenomegaly MSK: extremities are warm, no edema.  Skin: warm, no rash Neuro:  no focal deficits Psych: appropriate affect  Diagnostic Studies 01/2014 Echo Study Conclusions  - Left ventricle: The cavity size was normal. Wall thickness was increased in a pattern of mild LVH. Systolic function was normal. The estimated ejection fraction was in the range of 55% to 60%. Wall motion was normal; there were no regional wall motion abnormalities. Doppler parameters are consistent with abnormal left ventricular relaxation (grade 1 diastolic dysfunction). Doppler parameters are consistent with elevated mean left atrial filling pressure. Medial E/e&' 12.4. - Left atrium: The atrium was moderately dilated. - Right atrium: The atrium was mildly dilated. - Pulmonic valve: There was mild regurgitation.     01/2014 Lexiscan MPI IMPRESSION:  1. Abnormal study. Perfusion imaging suggests mild region of  ischemia in the mid apical inferolateral wall. The baseline GXT was  however low risk with Duke treadmill score of 8.5, and hypertensive  response.  2. No focal LV wall motion abnormalities by gated imaging.  3. Left ventricular ejection fraction 44% although appears better  visually.  4. Overall low-risk stress test findings*.   07/2015 Exercise MPI  No diagnostic ST segment changes.  Occasional PVCs noted during exercise, more frequent in recovery. Nonlimiting chest pain reported with hypertensive response. Intermediate risk Duke treadmill score of 3.5.  Small, moderate intensity, mid to apical inferolateral defect that is reversible consistent with ischemia.  This is an intermediate risk study.  Nuclear stress EF: 37%, appears higher visually. Suggest echocardiogram to further assess.  01/2016 cath  Ost Cx lesion, 75 %stenosed.  LM lesion, 50 %stenosed.  The left ventricular systolic function is normal.  The left ventricular ejection fraction is 50-55% by visual estimate.  LV end diastolic pressure is mildly elevated.   Eccentric 70-80% proximal/ostial circumflex. Circumflex territory is large.  50% stenosis in the distal left main.  Codominant right coronary  Overall normal LV function with mild elevation and end-diastolic pressure.   03/2017 echo Study Conclusions  - Left ventricle: The cavity size was normal. Wall thickness was increased in a pattern of mild LVH. Systolic function was mildly reduced. The estimated ejection fraction was in the range of 45% to 50%. Diffuse hypokinesis. Features are consistent with a pseudonormal left ventricular filling pattern, with concomitant abnormal relaxation and increased filling pressure (grade 2 diastolic dysfunction). Doppler parameters are consistent with high ventricular filling pressure. - Left atrium: The atrium was mildly dilated. - Pulmonary arteries: PA peak pressure: 31 mm Hg (S).  Impressions:  - Left ventricular systolic function appeared better in some views and worse on others. Overall, LVEF appears low normal to mildly reduced, 45-50%.  01/2016 PFTs Overinflation without specific obstruction  08/2017 nuclear stress  No clearly diagnostic ST segment changes to indicate ischemia. Hypertensive response noted. No chest pain was reported. Occasional frequent PVCs  were noted during exercise including 3 beat burst of NSVT near peak after injection of radiotracer. There were no sustained events. Overall low risk Duke treadmill score of 8.5.  Blood pressure demonstrated a hypertensive response to exercise.  Small, mild intensity, mid to basal inferolateral defect that is fixed and most consistent with scar versus attenuation artifact.  This is an intermediate risk study.  Nuclear stress EF: 44%.   Assessment and Plan  1.CAD - ongoing symptoms of chest pain and SOB. Recent nuclear stress without ischemia, exercised 8.5 minutes without clear EKG changes. Did have some PVCs and very short runs of NSVT - repeat echo due to ongoing SOB/DOE symptoms.  - start imdur 15mg  daily. If symptoms continue/progress consider cath.  2. LE edema - obtain echo - increase lasix 60mg  prn.He is to udpate [place] on his weights on Friday.   3. HTN -he is at goal, continue curren meds  4. Hyperlipidemia - side effects to more potent statins, tolerating pravastatin - continue current statin     F/u 1 month     Arnoldo Lenis, M.D.

## 2017-10-09 NOTE — Telephone Encounter (Signed)
Echo scheduled in [facility]  11/14/17

## 2017-10-12 DIAGNOSIS — E785 Hyperlipidemia, unspecified: Secondary | ICD-10-CM | POA: Diagnosis not present

## 2017-10-12 DIAGNOSIS — G8929 Other chronic pain: Secondary | ICD-10-CM | POA: Diagnosis not present

## 2017-10-12 DIAGNOSIS — I509 Heart failure, unspecified: Secondary | ICD-10-CM | POA: Diagnosis not present

## 2017-10-12 DIAGNOSIS — I2510 Atherosclerotic heart disease of native coronary artery without angina pectoris: Secondary | ICD-10-CM | POA: Diagnosis not present

## 2017-10-12 DIAGNOSIS — I110 Hypertensive heart disease with heart failure: Secondary | ICD-10-CM | POA: Diagnosis not present

## 2017-10-12 DIAGNOSIS — G4730 Sleep apnea, unspecified: Secondary | ICD-10-CM | POA: Diagnosis not present

## 2017-10-12 DIAGNOSIS — G3184 Mild cognitive impairment, so stated: Secondary | ICD-10-CM | POA: Diagnosis not present

## 2017-10-12 DIAGNOSIS — E669 Obesity, unspecified: Secondary | ICD-10-CM | POA: Diagnosis not present

## 2017-10-12 DIAGNOSIS — E1142 Type 2 diabetes mellitus with diabetic polyneuropathy: Secondary | ICD-10-CM | POA: Diagnosis not present

## 2017-10-12 DIAGNOSIS — E1159 Type 2 diabetes mellitus with other circulatory complications: Secondary | ICD-10-CM | POA: Diagnosis not present

## 2017-10-15 ENCOUNTER — Encounter: Payer: Self-pay | Admitting: Cardiology

## 2017-10-22 ENCOUNTER — Ambulatory Visit: Payer: Medicare HMO | Admitting: Cardiology

## 2017-11-14 ENCOUNTER — Ambulatory Visit (INDEPENDENT_AMBULATORY_CARE_PROVIDER_SITE_OTHER): Payer: Medicare HMO

## 2017-11-14 ENCOUNTER — Other Ambulatory Visit: Payer: Self-pay

## 2017-11-14 DIAGNOSIS — R0602 Shortness of breath: Secondary | ICD-10-CM | POA: Diagnosis not present

## 2017-11-19 ENCOUNTER — Telehealth: Payer: Self-pay | Admitting: *Deleted

## 2017-11-19 ENCOUNTER — Encounter: Payer: Self-pay | Admitting: *Deleted

## 2017-11-19 DIAGNOSIS — E782 Mixed hyperlipidemia: Secondary | ICD-10-CM | POA: Diagnosis not present

## 2017-11-19 DIAGNOSIS — L989 Disorder of the skin and subcutaneous tissue, unspecified: Secondary | ICD-10-CM | POA: Diagnosis not present

## 2017-11-19 DIAGNOSIS — I2510 Atherosclerotic heart disease of native coronary artery without angina pectoris: Secondary | ICD-10-CM | POA: Diagnosis not present

## 2017-11-19 DIAGNOSIS — R972 Elevated prostate specific antigen [PSA]: Secondary | ICD-10-CM | POA: Diagnosis not present

## 2017-11-19 DIAGNOSIS — I10 Essential (primary) hypertension: Secondary | ICD-10-CM | POA: Diagnosis not present

## 2017-11-19 DIAGNOSIS — G9009 Other idiopathic peripheral autonomic neuropathy: Secondary | ICD-10-CM | POA: Diagnosis not present

## 2017-11-19 DIAGNOSIS — D649 Anemia, unspecified: Secondary | ICD-10-CM | POA: Diagnosis not present

## 2017-11-19 DIAGNOSIS — K219 Gastro-esophageal reflux disease without esophagitis: Secondary | ICD-10-CM | POA: Diagnosis not present

## 2017-11-19 DIAGNOSIS — E1165 Type 2 diabetes mellitus with hyperglycemia: Secondary | ICD-10-CM | POA: Diagnosis not present

## 2017-11-19 DIAGNOSIS — R351 Nocturia: Secondary | ICD-10-CM | POA: Diagnosis not present

## 2017-11-19 DIAGNOSIS — Z125 Encounter for screening for malignant neoplasm of prostate: Secondary | ICD-10-CM | POA: Diagnosis not present

## 2017-11-19 NOTE — Telephone Encounter (Signed)
-----   Message from Arnoldo Lenis, MD sent at 11/19/2017 12:40 PM EDT ----- Echo looks good, normal heart function   Zandra Abts MD

## 2017-11-19 NOTE — Telephone Encounter (Signed)
Pt aware - routed to pcp  

## 2017-11-20 ENCOUNTER — Encounter: Payer: Self-pay | Admitting: Cardiology

## 2017-11-20 ENCOUNTER — Ambulatory Visit: Payer: Medicare HMO | Admitting: Cardiology

## 2017-11-20 VITALS — BP 130/74 | HR 51 | Ht 69.0 in | Wt 215.8 lb

## 2017-11-20 DIAGNOSIS — I2510 Atherosclerotic heart disease of native coronary artery without angina pectoris: Secondary | ICD-10-CM

## 2017-11-20 NOTE — Progress Notes (Signed)
Clinical Summary Mr. Neeson is a 67 y.o.male seen today for follow up of the following medical problems.This is a focused visit on his history of CAD and recent chest pain, for more detailed history please refer to prior clinic notes.   1.CAD - history of LIMA-LAD and SVG-OM2 CABG 01/2016. LVEF 50-55% by TEE at the time - 03/2017 echo LVEF 45-50%. Grade Ii diastolic dysfunction.  08/2017 nuclear stress: PVCs, short runs NSVT. Small mild intensity mid to basal inferolatearl defect fixed most consistent with scar. LVEF 44% 10/2017 echo LVEF 50-55%, no WMAs   - last visit we started imdur 15mg  daily. Chest pain symptoms have resolved      Past Medical History:  Diagnosis Date  . Adenomatous colon polyp 08/2010  . Chronic back pain   . Coronary artery disease   . Diabetes mellitus without complication ([place])   . GERD (gastroesophageal reflux disease)   . Hiatal hernia   . HTN (hypertension)   . Hyperlipidemia   . Renal cyst    Large Left  . Restless legs   . Sleep apnea    uses cpap     Allergies  Allergen Reactions  . Other     Medication for insomnia  . Simvastatin     REACTION: arm numbness Patient currently on this medication (03/2012).     Current Outpatient Medications  Medication Sig Dispense Refill  . amLODipine (NORVASC) 10 MG tablet Take 1 tablet (10 mg total) daily by mouth. 180 tablet 3  . aspirin EC 81 MG tablet Take 81 mg daily by mouth.    . furosemide (LASIX) 40 MG tablet Take 1.5 tablets (60 mg total) by mouth as needed. 135 tablet 1  . gabapentin (NEURONTIN) 300 MG capsule Take 300 mg by mouth at bedtime.    . isosorbide mononitrate (IMDUR) 30 MG 24 hr tablet Take 0.5 tablets (15 mg total) by mouth daily. 45 tablet 1  . lisinopril (PRINIVIL,ZESTRIL) 5 MG tablet Take 1 tablet (5 mg total) by mouth daily. 90 tablet 3  . metFORMIN (GLUCOPHAGE) 500 MG tablet Take 500 mg by mouth 2 (two) times daily with a meal.    . metoprolol tartrate  (LOPRESSOR) 25 MG tablet take 25 mg two times daily  1  . mometasone (NASONEX) 50 MCG/ACT nasal spray Place 2 sprays into the nose daily as needed (for nasal congestion).    . pantoprazole (PROTONIX) 40 MG tablet Take 40 mg by mouth at bedtime.     . pravastatin (PRAVACHOL) 20 MG tablet Take 1 tablet (20 mg total) by mouth every evening. 90 tablet 3  . triamterene-hydrochlorothiazide (DYAZIDE) 37.5-25 MG capsule Take 1 capsule by mouth every morning.      No current facility-administered medications for this visit.      Past Surgical History:  Procedure Laterality Date  . APPENDECTOMY  1967  . CARDIAC CATHETERIZATION N/A 01/31/2016   Procedure: Left Heart Cath and Coronary Angiography;  Surgeon: Belva Crome, MD;  Location: [facility];  Service: Cardiovascular;  Laterality: N/A;  . CARDIOVASCULAR STRESS TEST  2004   Treadmill Stress Test  . CARDIOVASCULAR STRESS TEST  2010  . CHOLECYSTECTOMY  07/2006  . COLONOSCOPY  08/2010   RMR: Cecal tubular adenoma removed, otherwise normal exam. next tcs 08/2015  . CORONARY ARTERY BYPASS GRAFT N/A 02/28/2016   Procedure: CORONARY ARTERY BYPASS GRAFTING x two using left internal mammary artery and right leg greater saphenous vein harvested endoscopically;  Surgeon:  Gaye Pollack, MD;  Location: [facility];  Service: Open Heart Surgery;  Laterality: N/A;  . [place]  . TEE WITHOUT CARDIOVERSION N/A 02/28/2016   Procedure: TRANSESOPHAGEAL ECHOCARDIOGRAM (TEE);  Surgeon: Gaye Pollack, MD;  Location: [facility];  Service: Open Heart Surgery;  Laterality: N/A;  . UPPER GASTROINTESTINAL ENDOSCOPY  08/2010   RMR: GERD benign biopsy  . VASECTOMY       Allergies  Allergen Reactions  . Other     Medication for insomnia  . Simvastatin     REACTION: arm numbness Patient currently on this medication (03/2012).      Family History  Problem Relation Age of Onset  . Lymphoma Mother   . Ulcers Father   . Ulcers Paternal Grandfather          Stomach ulcer, resection  . Heart attack Other   . Diabetes Other      Social History Mr. Pultz reports that he has never smoked. He has never used smokeless tobacco. Mr. Fitzgibbon reports that he drinks about 1.2 oz of alcohol per week.   Review of Systems CONSTITUTIONAL: No weight loss, fever, chills, weakness or fatigue.  HEENT: Eyes: No visual loss, blurred vision, double vision or yellow sclerae.No hearing loss, sneezing, congestion, runny nose or sore throat.  SKIN: No rash or itching.  CARDIOVASCULAR: per hpi RESPIRATORY: No shortness of breath, cough or sputum.  GASTROINTESTINAL: No anorexia, nausea, vomiting or diarrhea. No abdominal pain or blood.  GENITOURINARY: No burning on urination, no polyuria NEUROLOGICAL: No headache, dizziness, syncope, paralysis, ataxia, numbness or tingling in the extremities. No change in bowel or bladder control.  MUSCULOSKELETAL: No muscle, back pain, joint pain or stiffness.  LYMPHATICS: No enlarged nodes. No history of splenectomy.  PSYCHIATRIC: No history of depression or anxiety.  ENDOCRINOLOGIC: No reports of sweating, cold or heat intolerance. No polyuria or polydipsia.  Marland Kitchen   Physical Examination Vitals:   11/20/17 1108  BP: 130/74  Pulse: (!) 51   Vitals:   11/20/17 1108  Weight: 215 lb 12.8 oz (97.9 kg)  Height: 5\' 9"  (1.753 m)    Gen: resting comfortably, no acute distress HEENT: no scleral icterus, pupils equal round and reactive, no palptable cervical adenopathy,  CV: RRR, no m/r/g, no jvd Resp: Clear to auscultation bilaterally GI: abdomen is soft, non-tender, non-distended, normal bowel sounds, no hepatosplenomegaly MSK: extremities are warm, no edema.  Skin: warm, no rash Neuro:  no focal deficits Psych: appropriate affect   Diagnostic Studies 01/2014 Echo Study Conclusions  - Left ventricle: The cavity size was normal. Wall thickness was increased in a pattern of mild LVH. Systolic function was  normal. The estimated ejection fraction was in the range of 55% to 60%. Wall motion was normal; there were no regional wall motion abnormalities. Doppler parameters are consistent with abnormal left ventricular relaxation (grade 1 diastolic dysfunction). Doppler parameters are consistent with elevated mean left atrial filling pressure. Medial E/e&' 12.4. - Left atrium: The atrium was moderately dilated. - Right atrium: The atrium was mildly dilated. - Pulmonic valve: There was mild regurgitation.     01/2014 Lexiscan MPI IMPRESSION:  1. Abnormal study. Perfusion imaging suggests mild region of  ischemia in the mid apical inferolateral wall. The baseline GXT was  however low risk with Duke treadmill score of 8.5, and hypertensive  response.  2. No focal LV wall motion abnormalities by gated imaging.  3. Left ventricular ejection fraction 44% although appears  better  visually.  4. Overall low-risk stress test findings*.   07/2015 Exercise MPI  No diagnostic ST segment changes. Occasional PVCs noted during exercise, more frequent in recovery. Nonlimiting chest pain reported with hypertensive response. Intermediate risk Duke treadmill score of 3.5.  Small, moderate intensity, mid to apical inferolateral defect that is reversible consistent with ischemia.  This is an intermediate risk study.  Nuclear stress EF: 37%, appears higher visually. Suggest echocardiogram to further assess.  01/2016 cath  Ost Cx lesion, 75 %stenosed.  LM lesion, 50 %stenosed.  The left ventricular systolic function is normal.  The left ventricular ejection fraction is 50-55% by visual estimate.  LV end diastolic pressure is mildly elevated.   Eccentric 70-80% proximal/ostial circumflex. Circumflex territory is large.  50% stenosis in the distal left main.  Codominant right coronary  Overall normal LV function with mild elevation and end-diastolic pressure.   03/2017  echo Study Conclusions  - Left ventricle: The cavity size was normal. Wall thickness was increased in a pattern of mild LVH. Systolic function was mildly reduced. The estimated ejection fraction was in the range of 45% to 50%. Diffuse hypokinesis. Features are consistent with a pseudonormal left ventricular filling pattern, with concomitant abnormal relaxation and increased filling pressure (grade 2 diastolic dysfunction). Doppler parameters are consistent with high ventricular filling pressure. - Left atrium: The atrium was mildly dilated. - Pulmonary arteries: PA peak pressure: 31 mm Hg (S).  Impressions:  - Left ventricular systolic function appeared better in some views and worse on others. Overall, LVEF appears low normal to mildly reduced, 45-50%.  01/2016 PFTs Overinflation without specific obstruction  08/2017 nuclear stress  No clearly diagnostic ST segment changes to indicate ischemia. Hypertensive response noted. No chest pain was reported. Occasional frequent PVCs were noted during exercise including 3 beat burst of NSVT near peak after injection of radiotracer. There were no sustained events. Overall low risk Duke treadmill score of 8.5.  Blood pressure demonstrated a hypertensive response to exercise.  Small, mild intensity, mid to basal inferolateral defect that is fixed and most consistent with scar versus attenuation artifact.  This is an intermediate risk study.  Nuclear stress EF: 44%.  10/2017 echo Study Conclusions  - Left ventricle: The cavity size was normal. Wall thickness was   normal. Systolic function was normal. The estimated ejection   fraction was in the range of 50% to 55%. Wall motion was normal;   there were no regional wall motion abnormalities. Indeterminate   diastolic function. - Aortic valve: Moderately calcified annulus. Trileaflet. - Aorta: Mild aortic root enlargement. - Mitral valve: There was mild  regurgitation.   Assessment and Plan    1.CAD - recent chest pain symptoms, stress testing has been low risk without evidence of ischemia. Echo with normal function - symptoms improved since starting imdur - contniue medical therapy at this time. If progression of symptoms consider cath.     Arnoldo Lenis, M.D.

## 2017-11-20 NOTE — Patient Instructions (Signed)

## 2017-11-21 DIAGNOSIS — Z0000 Encounter for general adult medical examination without abnormal findings: Secondary | ICD-10-CM | POA: Diagnosis not present

## 2017-11-21 DIAGNOSIS — G9009 Other idiopathic peripheral autonomic neuropathy: Secondary | ICD-10-CM | POA: Diagnosis not present

## 2017-11-21 DIAGNOSIS — M25512 Pain in left shoulder: Secondary | ICD-10-CM | POA: Diagnosis not present

## 2017-11-21 DIAGNOSIS — E1165 Type 2 diabetes mellitus with hyperglycemia: Secondary | ICD-10-CM | POA: Diagnosis not present

## 2017-11-21 DIAGNOSIS — I10 Essential (primary) hypertension: Secondary | ICD-10-CM | POA: Diagnosis not present

## 2017-11-21 DIAGNOSIS — K219 Gastro-esophageal reflux disease without esophagitis: Secondary | ICD-10-CM | POA: Diagnosis not present

## 2017-11-21 DIAGNOSIS — Z712 Person consulting for explanation of examination or test findings: Secondary | ICD-10-CM | POA: Diagnosis not present

## 2017-11-21 DIAGNOSIS — I2510 Atherosclerotic heart disease of native coronary artery without angina pectoris: Secondary | ICD-10-CM | POA: Diagnosis not present

## 2017-11-21 DIAGNOSIS — N183 Chronic kidney disease, stage 3 (moderate): Secondary | ICD-10-CM | POA: Diagnosis not present

## 2017-11-21 DIAGNOSIS — E782 Mixed hyperlipidemia: Secondary | ICD-10-CM | POA: Diagnosis not present

## 2017-12-24 DIAGNOSIS — G4733 Obstructive sleep apnea (adult) (pediatric): Secondary | ICD-10-CM | POA: Diagnosis not present

## 2018-02-07 DIAGNOSIS — G4733 Obstructive sleep apnea (adult) (pediatric): Secondary | ICD-10-CM | POA: Diagnosis not present

## 2018-02-08 DIAGNOSIS — D509 Iron deficiency anemia, unspecified: Secondary | ICD-10-CM | POA: Diagnosis not present

## 2018-02-08 DIAGNOSIS — I2510 Atherosclerotic heart disease of native coronary artery without angina pectoris: Secondary | ICD-10-CM | POA: Diagnosis not present

## 2018-02-08 DIAGNOSIS — I10 Essential (primary) hypertension: Secondary | ICD-10-CM | POA: Diagnosis not present

## 2018-02-08 DIAGNOSIS — D649 Anemia, unspecified: Secondary | ICD-10-CM | POA: Diagnosis not present

## 2018-02-08 DIAGNOSIS — G9009 Other idiopathic peripheral autonomic neuropathy: Secondary | ICD-10-CM | POA: Diagnosis not present

## 2018-02-08 DIAGNOSIS — E1165 Type 2 diabetes mellitus with hyperglycemia: Secondary | ICD-10-CM | POA: Diagnosis not present

## 2018-02-08 DIAGNOSIS — K219 Gastro-esophageal reflux disease without esophagitis: Secondary | ICD-10-CM | POA: Diagnosis not present

## 2018-02-08 DIAGNOSIS — E782 Mixed hyperlipidemia: Secondary | ICD-10-CM | POA: Diagnosis not present

## 2018-02-28 ENCOUNTER — Telehealth: Payer: Self-pay | Admitting: Cardiology

## 2018-02-28 NOTE — Telephone Encounter (Signed)
Forwarded to provider for his advice.

## 2018-02-28 NOTE — Telephone Encounter (Signed)
Received call from Dr. Wende Neighbors in regards to patient being on metoprolol tartrate (LOPRESSOR) 25 MG . States that this medication is causing ED. Wants to know if patient can be started on another beta blocker.   [phone] (Amy)

## 2018-03-01 NOTE — Telephone Encounter (Signed)
Not sure will an alternative beta blocker would have much difference if indeed that's what causing the side effects but worth a try. Can try stopping lopressor and taking coreg 3.125mg  bid.   Zandra Abts MD

## 2018-03-01 NOTE — Telephone Encounter (Signed)
Patient notified.  Stated he will think about it & then get back to [place] if he decides to change medications.

## 2018-03-01 NOTE — Telephone Encounter (Signed)
Left message for return call from patient

## 2018-04-10 ENCOUNTER — Other Ambulatory Visit: Payer: Self-pay | Admitting: Cardiology

## 2018-06-06 DIAGNOSIS — E782 Mixed hyperlipidemia: Secondary | ICD-10-CM | POA: Diagnosis not present

## 2018-06-06 DIAGNOSIS — E1165 Type 2 diabetes mellitus with hyperglycemia: Secondary | ICD-10-CM | POA: Diagnosis not present

## 2018-06-06 DIAGNOSIS — E119 Type 2 diabetes mellitus without complications: Secondary | ICD-10-CM | POA: Diagnosis not present

## 2018-06-06 DIAGNOSIS — I10 Essential (primary) hypertension: Secondary | ICD-10-CM | POA: Diagnosis not present

## 2018-06-06 DIAGNOSIS — D509 Iron deficiency anemia, unspecified: Secondary | ICD-10-CM | POA: Diagnosis not present

## 2018-06-06 DIAGNOSIS — E785 Hyperlipidemia, unspecified: Secondary | ICD-10-CM | POA: Diagnosis not present

## 2018-06-06 DIAGNOSIS — D649 Anemia, unspecified: Secondary | ICD-10-CM | POA: Diagnosis not present

## 2018-06-06 DIAGNOSIS — R972 Elevated prostate specific antigen [PSA]: Secondary | ICD-10-CM | POA: Diagnosis not present

## 2018-06-06 DIAGNOSIS — R7301 Impaired fasting glucose: Secondary | ICD-10-CM | POA: Diagnosis not present

## 2018-06-07 ENCOUNTER — Ambulatory Visit: Payer: Medicare HMO | Admitting: Cardiology

## 2018-06-07 DIAGNOSIS — R0989 Other specified symptoms and signs involving the circulatory and respiratory systems: Secondary | ICD-10-CM

## 2018-06-07 NOTE — Progress Notes (Deleted)
Clinical Summary Thomas [name] is a 68 y.o.male  1.CAD - history of LIMA-LAD and SVG-OM2 CABG 01/2016. LVEF 50-55% by TEE at the time - 03/2017 echo LVEF 45-50%. Grade Ii diastolic dysfunction.  08/2017 nuclear stress: PVCs, short runs NSVT. Small mild intensity mid to basal inferolatearl defect fixed most consistent with scar. LVEF 44% 10/2017 echo LVEF 50-55%, no WMAs   - last visit we started imdur 15mg  daily. Chest pain symptoms have resolved    2. LE edema - increased edema. Taking lasix 40mg  prn, on daily. New onset in early May.  - weight up Jan 2019 211 to 216 lbs.  - checks home weights  3. Irregular heart beat - noted on his vitals machine.  - PVCs noted on stress test.    4. HTN -he is compliant with meds - home bp's 120s/50s  3. Hyperlipidemia - lipitor caused muscle aches. Simvastatin caused side effects. Tolerating pravaststin ok.  - recent labs with pcphe reports   4. SOB - 01/2016 PFTs showed evidence of air trapping without specific obstruction.  - no smoking history   SH: upcoming trip to [place], [place].  Past Medical History:  Diagnosis Date  . Adenomatous colon polyp 08/2010  . Chronic back pain   . Coronary artery disease   . Diabetes mellitus without complication ([place])   . GERD (gastroesophageal reflux disease)   . Hiatal hernia   . HTN (hypertension)   . Hyperlipidemia   . Renal cyst    Large Left  . Restless legs   . Sleep apnea    uses cpap     Allergies  Allergen Reactions  . Other     Medication for insomnia  . Simvastatin     REACTION: arm numbness Patient currently on this medication (03/2012).     Current Outpatient Medications  Medication Sig Dispense Refill  . amLODipine (NORVASC) 10 MG tablet Take 1 tablet (10 mg total) daily by mouth. 180 tablet 3  . aspirin EC 81 MG tablet Take 81 mg daily by mouth.    . furosemide (LASIX) 40 MG tablet TAKE 1.5 TABLETS (60 MG TOTAL) BY MOUTH AS NEEDED. 135 tablet  1  . gabapentin (NEURONTIN) 300 MG capsule Take 300 mg by mouth at bedtime.    . isosorbide mononitrate (IMDUR) 30 MG 24 hr tablet TAKE 0.5 TABLETS (15 MG TOTAL) BY MOUTH DAILY. 45 tablet 1  . lisinopril (PRINIVIL,ZESTRIL) 5 MG tablet Take 1 tablet (5 mg total) by mouth daily. 90 tablet 3  . metFORMIN (GLUCOPHAGE) 500 MG tablet Take 500 mg by mouth 2 (two) times daily with a meal.    . metoprolol tartrate (LOPRESSOR) 25 MG tablet take 25 mg two times daily  1  . mometasone (NASONEX) 50 MCG/ACT nasal spray Place 2 sprays into the nose daily as needed (for nasal congestion).    . pantoprazole (PROTONIX) 40 MG tablet Take 40 mg by mouth at bedtime.     . pravastatin (PRAVACHOL) 20 MG tablet Take 1 tablet (20 mg total) by mouth every evening. 90 tablet 3  . triamterene-hydrochlorothiazide (DYAZIDE) 37.5-25 MG capsule Take 1 capsule by mouth every morning.      No current facility-administered medications for this visit.      Past Surgical History:  Procedure Laterality Date  . APPENDECTOMY  1967  . CARDIAC CATHETERIZATION N/A 01/31/2016   Procedure: Left Heart Cath and Coronary Angiography;  Surgeon: Belva Crome, MD;  Location: [facility];  Service: Cardiovascular;  Laterality: N/A;  . CARDIOVASCULAR STRESS TEST  2004   Treadmill Stress Test  . CARDIOVASCULAR STRESS TEST  2010  . CHOLECYSTECTOMY  07/2006  . COLONOSCOPY  08/2010   RMR: Cecal tubular adenoma removed, otherwise normal exam. next tcs 08/2015  . CORONARY ARTERY BYPASS GRAFT N/A 02/28/2016   Procedure: CORONARY ARTERY BYPASS GRAFTING x two using left internal mammary artery and right leg greater saphenous vein harvested endoscopically;  Surgeon: Gaye Pollack, MD;  Location: [facility];  Service: Open Heart Surgery;  Laterality: N/A;  . [place]  . TEE WITHOUT CARDIOVERSION N/A 02/28/2016   Procedure: TRANSESOPHAGEAL ECHOCARDIOGRAM (TEE);  Surgeon: Gaye Pollack, MD;  Location: [facility];  Service: Open Heart  Surgery;  Laterality: N/A;  . UPPER GASTROINTESTINAL ENDOSCOPY  08/2010   RMR: GERD benign biopsy  . VASECTOMY       Allergies  Allergen Reactions  . Other     Medication for insomnia  . Simvastatin     REACTION: arm numbness Patient currently on this medication (03/2012).      Family History  Problem Relation Age of Onset  . Lymphoma Mother   . Ulcers Father   . Ulcers Paternal Grandfather        Stomach ulcer, resection  . Heart attack Other   . Diabetes Other      Social History Thomas [name] reports that he has never smoked. He has never used smokeless tobacco. Thomas [name] reports current alcohol use of about 2.0 standard drinks of alcohol per week.   Review of Systems CONSTITUTIONAL: No weight loss, fever, chills, weakness or fatigue.  HEENT: Eyes: No visual loss, blurred vision, double vision or yellow sclerae.No hearing loss, sneezing, congestion, runny nose or sore throat.  SKIN: No rash or itching.  CARDIOVASCULAR:  RESPIRATORY: No shortness of breath, cough or sputum.  GASTROINTESTINAL: No anorexia, nausea, vomiting or diarrhea. No abdominal pain or blood.  GENITOURINARY: No burning on urination, no polyuria NEUROLOGICAL: No headache, dizziness, syncope, paralysis, ataxia, numbness or tingling in the extremities. No change in bowel or bladder control.  MUSCULOSKELETAL: No muscle, back pain, joint pain or stiffness.  LYMPHATICS: No enlarged nodes. No history of splenectomy.  PSYCHIATRIC: No history of depression or anxiety.  ENDOCRINOLOGIC: No reports of sweating, cold or heat intolerance. No polyuria or polydipsia.  Marland Kitchen   Physical Examination There were no vitals filed for this visit. There were no vitals filed for this visit.  Gen: resting comfortably, no acute distress HEENT: no scleral icterus, pupils equal round and reactive, no palptable cervical adenopathy,  CV Resp: Clear to auscultation bilaterally GI: abdomen is soft, non-tender, non-distended,  normal bowel sounds, no hepatosplenomegaly MSK: extremities are warm, no edema.  Skin: warm, no rash Neuro:  no focal deficits Psych: appropriate affect   Diagnostic Studies  01/2014 Echo Study Conclusions  - Left ventricle: The cavity size was normal. Wall thickness was increased in a pattern of mild LVH. Systolic function was normal. The estimated ejection fraction was in the range of 55% to 60%. Wall motion was normal; there were no regional wall motion abnormalities. Doppler parameters are consistent with abnormal left ventricular relaxation (grade 1 diastolic dysfunction). Doppler parameters are consistent with elevated mean left atrial filling pressure. Medial E/e&' 12.4. - Left atrium: The atrium was moderately dilated. - Right atrium: The atrium was mildly dilated. - Pulmonic valve: There was mild regurgitation.  01/2014 Lexiscan MPI IMPRESSION:  1. Abnormal  study. Perfusion imaging suggests mild region of  ischemia in the mid apical inferolateral wall. The baseline GXT was  however low risk with Duke treadmill score of 8.5, and hypertensive  response.  2. No focal LV wall motion abnormalities by gated imaging.  3. Left ventricular ejection fraction 44% although appears better  visually.  4. Overall low-risk stress test findings*.   07/2015 Exercise MPI  No diagnostic ST segment changes. Occasional PVCs noted during exercise, more frequent in recovery. Nonlimiting chest pain reported with hypertensive response. Intermediate risk Duke treadmill score of 3.5.  Small, moderate intensity, mid to apical inferolateral defect that is reversible consistent with ischemia.  This is an intermediate risk study.  Nuclear stress EF: 37%, appears higher visually. Suggest echocardiogram to further assess.  01/2016 cath  Ost Cx lesion, 75 %stenosed.  LM lesion, 50 %stenosed.  The left ventricular systolic function is normal.  The left ventricular ejection  fraction is 50-55% by visual estimate.  LV end diastolic pressure is mildly elevated.   Eccentric 70-80% proximal/ostial circumflex. Circumflex territory is large.  50% stenosis in the distal left main.  Codominant right coronary  Overall normal LV function with mild elevation and end-diastolic pressure.   03/2017 echo Study Conclusions  - Left ventricle: The cavity size was normal. Wall thickness was increased in a pattern of mild LVH. Systolic function was mildly reduced. The estimated ejection fraction was in the range of 45% to 50%. Diffuse hypokinesis. Features are consistent with a pseudonormal left ventricular filling pattern, with concomitant abnormal relaxation and increased filling pressure (grade 2 diastolic dysfunction). Doppler parameters are consistent with high ventricular filling pressure. - Left atrium: The atrium was mildly dilated. - Pulmonary arteries: PA peak pressure: 31 mm Hg (S).  Impressions:  - Left ventricular systolic function appeared better in some views and worse on others. Overall, LVEF appears low normal to mildly reduced, 45-50%.  01/2016 PFTs Overinflation without specific obstruction  08/2017 nuclear stress  No clearly diagnostic ST segment changes to indicate ischemia. Hypertensive response noted. No chest pain was reported. Occasional frequent PVCs were noted during exercise including 3 beat burst of NSVT near peak after injection of radiotracer. There were no sustained events. Overall low risk Duke treadmill score of 8.5.  Blood pressure demonstrated a hypertensive response to exercise.  Small, mild intensity, mid to basal inferolateral defect that is fixed and most consistent with scar versus attenuation artifact.  This is an intermediate risk study.  Nuclear stress EF: 44%.  10/2017 echo Study Conclusions  - Left ventricle: The cavity size was normal. Wall thickness was normal. Systolic function  was normal. The estimated ejection fraction was in the range of 50% to 55%. Wall motion was normal; there were no regional wall motion abnormalities. Indeterminate diastolic function. - Aortic valve: Moderately calcified annulus. Trileaflet. - Aorta: Mild aortic root enlargement. - Mitral valve: There was mild regurgitation.  Assessment and Plan   1.CAD -recent chest pain symptoms, stress testing has been low risk without evidence of ischemia. Echo with normal function - symptoms improved since starting imdur - contniue medical therapy at this time. If progression of symptoms consider cath.    2. LE edema - obtain echo - increase lasix 60mg  prn.He is to udpate [place] on his weights on Friday.   3. HTN -he is at goal, continue curren meds  4. Hyperlipidemia - side effects to more potent statins, tolerating pravastatin - continue current statin      Arnoldo Lenis, M.D.

## 2018-06-28 DIAGNOSIS — R7301 Impaired fasting glucose: Secondary | ICD-10-CM | POA: Diagnosis not present

## 2018-06-28 DIAGNOSIS — K219 Gastro-esophageal reflux disease without esophagitis: Secondary | ICD-10-CM | POA: Diagnosis not present

## 2018-06-28 DIAGNOSIS — E782 Mixed hyperlipidemia: Secondary | ICD-10-CM | POA: Diagnosis not present

## 2018-06-28 DIAGNOSIS — E1165 Type 2 diabetes mellitus with hyperglycemia: Secondary | ICD-10-CM | POA: Diagnosis not present

## 2018-06-28 DIAGNOSIS — E119 Type 2 diabetes mellitus without complications: Secondary | ICD-10-CM | POA: Diagnosis not present

## 2018-06-28 DIAGNOSIS — I2510 Atherosclerotic heart disease of native coronary artery without angina pectoris: Secondary | ICD-10-CM | POA: Diagnosis not present

## 2018-06-28 DIAGNOSIS — I10 Essential (primary) hypertension: Secondary | ICD-10-CM | POA: Diagnosis not present

## 2018-06-28 DIAGNOSIS — E785 Hyperlipidemia, unspecified: Secondary | ICD-10-CM | POA: Diagnosis not present

## 2018-06-28 DIAGNOSIS — D509 Iron deficiency anemia, unspecified: Secondary | ICD-10-CM | POA: Diagnosis not present

## 2018-07-17 ENCOUNTER — Encounter: Payer: Self-pay | Admitting: *Deleted

## 2018-07-17 ENCOUNTER — Ambulatory Visit (INDEPENDENT_AMBULATORY_CARE_PROVIDER_SITE_OTHER): Payer: Medicare HMO | Admitting: Cardiology

## 2018-07-17 ENCOUNTER — Other Ambulatory Visit: Payer: Self-pay

## 2018-07-17 VITALS — BP 140/80 | HR 67 | Temp 98.7°F | Ht 69.0 in | Wt 209.8 lb

## 2018-07-17 DIAGNOSIS — R0789 Other chest pain: Secondary | ICD-10-CM

## 2018-07-17 DIAGNOSIS — I2510 Atherosclerotic heart disease of native coronary artery without angina pectoris: Secondary | ICD-10-CM | POA: Diagnosis not present

## 2018-07-17 DIAGNOSIS — E782 Mixed hyperlipidemia: Secondary | ICD-10-CM | POA: Diagnosis not present

## 2018-07-17 DIAGNOSIS — R600 Localized edema: Secondary | ICD-10-CM | POA: Diagnosis not present

## 2018-07-17 DIAGNOSIS — I10 Essential (primary) hypertension: Secondary | ICD-10-CM | POA: Diagnosis not present

## 2018-07-17 MED ORDER — ISOSORBIDE MONONITRATE ER 30 MG PO TB24: 15.0000 mg | ORAL_TABLET | Freq: Two times a day (BID) | ORAL | 1 refills | Status: DC

## 2018-07-17 NOTE — Progress Notes (Signed)
Clinical Summary Thomas [name] is a 68 y.o.male seen today for follow up of the following medical problems.   1.CAD - history of LIMA-LAD and SVG-OM2 CABG 01/2016. LVEF 50-55% by TEE at the time - 03/2017 echo LVEF 45-50%. Grade Ii diastolic dysfunction.  08/2017 nuclear stress: PVCs, short runs NSVT. Small mild intensity mid to basal inferolatearl defect fixed most consistent with scar. LVEF 44% 10/2017 echo LVEF 50-55%, no WMAs   - last visit we started imdur 15mg  daily. Chest pain symptoms have resolved  - reports some chest pains about 1 month ago.  - pressure mid to left chest, can shoot into back. 5/10 in severity. Often occurs at rest, or wakes up at night. Worst with bending over. No other associated symptoms other than fatigue. Can be constant up to 10 hours. Not related to food.  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 Can be better with advil - takes protonix about only about 20 days out of a month   2. LE edema -no recent troubles  3. Irregular heart beat - PVCs noted on stress test - rare infrequent short in duration palpitations.    4. HTN - home bps 140/s70s - compliant with meds  5. Hyperlipidemia - lipitor caused muscle aches. Simvastatin caused side effects. Tolerating pravaststin ok. - labs followed by pcp    Past Medical History:  Diagnosis Date  . Adenomatous colon polyp 08/2010  . Chronic back pain   . Coronary artery disease   . Diabetes mellitus without complication ([place])   . GERD (gastroesophageal reflux disease)   . Hiatal hernia   . HTN (hypertension)   . Hyperlipidemia   . Renal cyst    Large Left  . Restless legs   . Sleep apnea    uses cpap     Allergies  Allergen Reactions  . Other     Medication for insomnia  . Simvastatin     REACTION: arm numbness Patient currently on this medication (03/2012).     Current Outpatient Medications  Medication Sig Dispense Refill  . amLODipine (NORVASC)  10 MG tablet Take 1 tablet (10 mg total) daily by mouth. 180 tablet 3  . aspirin EC 81 MG tablet Take 81 mg daily by mouth.    . furosemide (LASIX) 40 MG tablet TAKE 1.5 TABLETS (60 MG TOTAL) BY MOUTH AS NEEDED. 135 tablet 1  . gabapentin (NEURONTIN) 300 MG capsule Take 300 mg by mouth at bedtime.    . isosorbide mononitrate (IMDUR) 30 MG 24 hr tablet TAKE 0.5 TABLETS (15 MG TOTAL) BY MOUTH DAILY. 45 tablet 1  . lisinopril (PRINIVIL,ZESTRIL) 5 MG tablet Take 1 tablet (5 mg total) by mouth daily. 90 tablet 3  . metFORMIN (GLUCOPHAGE) 500 MG tablet Take 500 mg by mouth 2 (two) times daily with a meal.    . metoprolol tartrate (LOPRESSOR) 25 MG tablet take 25 mg two times daily  1  . mometasone (NASONEX) 50 MCG/ACT nasal spray Place 2 sprays into the nose daily as needed (for nasal congestion).    . pantoprazole (PROTONIX) 40 MG tablet Take 40 mg by mouth at bedtime.     . pravastatin (PRAVACHOL) 20 MG tablet Take 1 tablet (20 mg total) by mouth every evening. 90 tablet 3  . triamterene-hydrochlorothiazide (DYAZIDE) 37.5-25 MG capsule Take 1 capsule by mouth every morning.      No current facility-administered medications for this visit.      Past Surgical  History:  Procedure Laterality Date  . APPENDECTOMY  1967  . CARDIAC CATHETERIZATION N/A 01/31/2016   Procedure: Left Heart Cath and Coronary Angiography;  Surgeon: Belva Crome, MD;  Location: [facility];  Service: Cardiovascular;  Laterality: N/A;  . CARDIOVASCULAR STRESS TEST  2004   Treadmill Stress Test  . CARDIOVASCULAR STRESS TEST  2010  . CHOLECYSTECTOMY  07/2006  . COLONOSCOPY  08/2010   RMR: Cecal tubular adenoma removed, otherwise normal exam. next tcs 08/2015  . CORONARY ARTERY BYPASS GRAFT N/A 02/28/2016   Procedure: CORONARY ARTERY BYPASS GRAFTING x two using left internal mammary artery and right leg greater saphenous vein harvested endoscopically;  Surgeon: Gaye Pollack, MD;  Location: [facility];  Service: Open Heart  Surgery;  Laterality: N/A;  . [place]  . TEE WITHOUT CARDIOVERSION N/A 02/28/2016   Procedure: TRANSESOPHAGEAL ECHOCARDIOGRAM (TEE);  Surgeon: Gaye Pollack, MD;  Location: [facility];  Service: Open Heart Surgery;  Laterality: N/A;  . UPPER GASTROINTESTINAL ENDOSCOPY  08/2010   RMR: GERD benign biopsy  . VASECTOMY       Allergies  Allergen Reactions  . Other     Medication for insomnia  . Simvastatin     REACTION: arm numbness Patient currently on this medication (03/2012).      Family History  Problem Relation Age of Onset  . Lymphoma Mother   . Ulcers Father   . Ulcers Paternal Grandfather        Stomach ulcer, resection  . Heart attack Other   . Diabetes Other      Social History Thomas [name] reports that he has never smoked. He has never used smokeless tobacco. Thomas [name] reports current alcohol use of about 2.0 standard drinks of alcohol per week.   Review of Systems CONSTITUTIONAL: No weight loss, fever, chills, weakness or fatigue.  HEENT: Eyes: No visual loss, blurred vision, double vision or yellow sclerae.No hearing loss, sneezing, congestion, runny nose or sore throat.  SKIN: No rash or itching.  CARDIOVASCULAR: per hpi RESPIRATORY: No shortness of breath, cough or sputum.  GASTROINTESTINAL: No anorexia, nausea, vomiting or diarrhea. No abdominal pain or blood.  GENITOURINARY: No burning on urination, no polyuria NEUROLOGICAL: No headache, dizziness, syncope, paralysis, ataxia, numbness or tingling in the extremities. No change in bowel or bladder control.  MUSCULOSKELETAL: No muscle, back pain, joint pain or stiffness.  LYMPHATICS: No enlarged nodes. No history of splenectomy.  PSYCHIATRIC: No history of depression or anxiety.  ENDOCRINOLOGIC: No reports of sweating, cold or heat intolerance. No polyuria or polydipsia.  Marland Kitchen   Physical Examination Today's Vitals   07/17/18 1510  BP: 140/80  Pulse: 67  Temp: 98.7 F (37.1 C)  SpO2: 96%   Weight: 209 lb 12.8 oz (95.2 kg)  Height: 5\' 9"  (1.753 m)   Body mass index is 30.98 kg/m.  Gen: resting comfortably, no acute distress HEENT: no scleral icterus, pupils equal round and reactive, no palptable cervical adenopathy,  CV: RRR, no m/r/g, no jvd Resp: Clear to auscultation bilaterally GI: abdomen is soft, non-tender, non-distended, normal bowel sounds, no hepatosplenomegaly MSK: extremities are warm, no edema.  Skin: warm, no rash Neuro:  no focal deficits Psych: appropriate affect   Diagnostic Studies 01/2014 Echo Study Conclusions  - Left ventricle: The cavity size was normal. Wall thickness was increased in a pattern of mild LVH. Systolic function was normal. The estimated ejection fraction was in the range of 55% to  60%. Wall motion was normal; there were no regional wall motion abnormalities. Doppler parameters are consistent with abnormal left ventricular relaxation (grade 1 diastolic dysfunction). Doppler parameters are consistent with elevated mean left atrial filling pressure. Medial E/e&' 12.4. - Left atrium: The atrium was moderately dilated. - Right atrium: The atrium was mildly dilated. - Pulmonic valve: There was mild regurgitation.     01/2014 Lexiscan MPI IMPRESSION:  1. Abnormal study. Perfusion imaging suggests mild region of  ischemia in the mid apical inferolateral wall. The baseline GXT was  however low risk with Duke treadmill score of 8.5, and hypertensive  response.  2. No focal LV wall motion abnormalities by gated imaging.  3. Left ventricular ejection fraction 44% although appears better  visually.  4. Overall low-risk stress test findings*.   07/2015 Exercise MPI  No diagnostic ST segment changes. Occasional PVCs noted during exercise, more frequent in recovery. Nonlimiting chest pain reported with hypertensive response. Intermediate risk Duke treadmill score of 3.5.  Small, moderate intensity, mid to apical  inferolateral defect that is reversible consistent with ischemia.  This is an intermediate risk study.  Nuclear stress EF: 37%, appears higher visually. Suggest echocardiogram to further assess.  01/2016 cath  Ost Cx lesion, 75 %stenosed.  LM lesion, 50 %stenosed.  The left ventricular systolic function is normal.  The left ventricular ejection fraction is 50-55% by visual estimate.  LV end diastolic pressure is mildly elevated.   Eccentric 70-80% proximal/ostial circumflex. Circumflex territory is large.  50% stenosis in the distal left main.  Codominant right coronary  Overall normal LV function with mild elevation and end-diastolic pressure.   03/2017 echo Study Conclusions  - Left ventricle: The cavity size was normal. Wall thickness was increased in a pattern of mild LVH. Systolic function was mildly reduced. The estimated ejection fraction was in the range of 45% to 50%. Diffuse hypokinesis. Features are consistent with a pseudonormal left ventricular filling pattern, with concomitant abnormal relaxation and increased filling pressure (grade 2 diastolic dysfunction). Doppler parameters are consistent with high ventricular filling pressure. - Left atrium: The atrium was mildly dilated. - Pulmonary arteries: PA peak pressure: 31 mm Hg (S).  Impressions:  - Left ventricular systolic function appeared better in some views and worse on others. Overall, LVEF appears low normal to mildly reduced, 45-50%.  01/2016 PFTs Overinflation without specific obstruction  08/2017 nuclear stress  No clearly diagnostic ST segment changes to indicate ischemia. Hypertensive response noted. No chest pain was reported. Occasional frequent PVCs were noted during exercise including 3 beat burst of NSVT near peak after injection of radiotracer. There were no sustained events. Overall low risk Duke treadmill score of 8.5.  Blood pressure demonstrated a  hypertensive response to exercise.  Small, mild intensity, mid to basal inferolateral defect that is fixed and most consistent with scar versus attenuation artifact.  This is an intermediate risk study.  Nuclear stress EF: 44%.  10/2017 echo Study Conclusions  - Left ventricle: The cavity size was normal. Wall thickness was normal. Systolic function was normal. The estimated ejection fraction was in the range of 50% to 55%. Wall motion was normal; there were no regional wall motion abnormalities. Indeterminate diastolic function. - Aortic valve: Moderately calcified annulus. Trileaflet. - Aorta: Mild aortic root enlargement. - Mitral valve: There was mild regurgitation.   - 01/2016 PFTs showed evidence of air trapping without specific obstruction.     Assessment and Plan   1.CAD -recent symptoms are not typical for cardiac  ischemia - mixed compliance with his PPI, encouraged to take daily. Emperically try imdur 15mg  bid at home - if change or progression of symptoms may consider ischemic testing at that that time. Last nuclear stress 2019 was benign, exercised 8.5 minutes - EKG today shows SR, no acute ischemic changes    2. LE edema - controlled, continue diuretic.   3. HTN -elevated today, continue to follow trend, high imdur doing may perhaps affect.   4. Hyperlipidemia - side effects to more potent statins, tolerating pravastatin - continue current therapy   F/u 2 months   Arnoldo Lenis, M.D.

## 2018-07-17 NOTE — Patient Instructions (Signed)
Your physician recommends that you schedule a follow-up appointment in: 2 [place] has recommended you make the following change in your medication:   INCREASE IMDUR 15MG  (1/2 TABLET) DAILY   Thank you for choosing [facility]!!

## 2018-07-18 ENCOUNTER — Encounter: Payer: Self-pay | Admitting: Cardiology

## 2018-07-18 NOTE — Addendum Note (Signed)
Addended by: Julian Hy T on: 07/18/2018 11:30 AM   Modules accepted: Orders

## 2018-08-14 DIAGNOSIS — D649 Anemia, unspecified: Secondary | ICD-10-CM | POA: Diagnosis not present

## 2018-08-14 DIAGNOSIS — E119 Type 2 diabetes mellitus without complications: Secondary | ICD-10-CM | POA: Diagnosis not present

## 2018-08-14 DIAGNOSIS — N183 Chronic kidney disease, stage 3 (moderate): Secondary | ICD-10-CM | POA: Diagnosis not present

## 2018-08-14 DIAGNOSIS — I10 Essential (primary) hypertension: Secondary | ICD-10-CM | POA: Diagnosis not present

## 2018-08-14 DIAGNOSIS — K219 Gastro-esophageal reflux disease without esophagitis: Secondary | ICD-10-CM | POA: Diagnosis not present

## 2018-08-14 DIAGNOSIS — E1165 Type 2 diabetes mellitus with hyperglycemia: Secondary | ICD-10-CM | POA: Diagnosis not present

## 2018-08-14 DIAGNOSIS — I2510 Atherosclerotic heart disease of native coronary artery without angina pectoris: Secondary | ICD-10-CM | POA: Diagnosis not present

## 2018-08-14 DIAGNOSIS — E782 Mixed hyperlipidemia: Secondary | ICD-10-CM | POA: Diagnosis not present

## 2018-08-14 DIAGNOSIS — D509 Iron deficiency anemia, unspecified: Secondary | ICD-10-CM | POA: Diagnosis not present

## 2018-08-14 DIAGNOSIS — R7301 Impaired fasting glucose: Secondary | ICD-10-CM | POA: Diagnosis not present

## 2018-08-16 ENCOUNTER — Telehealth: Payer: Self-pay | Admitting: Cardiology

## 2018-08-16 NOTE — Telephone Encounter (Signed)
metoprolol tartrate (LOPRESSOR) 25 MG tablet   Amy with Dr Juel Burrow office asking about changing dose of medication . Please contact her back

## 2018-08-16 NOTE — Telephone Encounter (Signed)
Had to LMTCB-cc

## 2018-08-19 MED ORDER — METOPROLOL SUCCINATE ER 25 MG PO TB24: 12.5000 mg | ORAL_TABLET | Freq: Every day | ORAL | 3 refills | Status: DC

## 2018-08-19 NOTE — Telephone Encounter (Signed)
I would try toprol xl 12.5mg  daily, stop lopressor   Zandra Abts MD

## 2018-08-19 NOTE — Telephone Encounter (Signed)
Amy in [place] office notified, e-scribed rx, lmtcb for patient

## 2018-08-19 NOTE — Telephone Encounter (Signed)
Amy at [place] office says patient is only taking lopressor 25 mg once a day due to fatigue. Says his HR is 55.They want to know if you could switch him to toprol xl 25 mg to take at bedtime?

## 2018-09-14 ENCOUNTER — Other Ambulatory Visit: Payer: Self-pay | Admitting: Cardiology

## 2018-10-03 ENCOUNTER — Other Ambulatory Visit: Payer: Self-pay | Admitting: Cardiology

## 2018-10-04 ENCOUNTER — Telehealth: Payer: Self-pay | Admitting: Cardiology

## 2018-10-04 NOTE — Telephone Encounter (Signed)
Virtual Visit Pre-Appointment Phone Call  "(Name), I am calling you today to discuss your upcoming appointment. We are currently trying to limit exposure to the virus that causes COVID-19 by seeing patients at home rather than in the office."  1. "What is the BEST phone number to call the day of the visit?" - include this in appointment notes  2. Do you have or have access to (through a family member/friend) a smartphone with video capability that we can use for your visit?" a. If yes - list this number in appt notes as cell (if different from BEST phone #) and list the appointment type as a VIDEO visit in appointment notes b. If no - list the appointment type as a PHONE visit in appointment notes  3. Confirm consent - "In the setting of the current Covid19 crisis, you are scheduled for a (phone or video) visit with your provider on (date) at (time).  Just as we do with many in-office visits, in order for you to participate in this visit, we must obtain consent.  If you'd like, I can send this to your mychart (if signed up) or email for you to review.  Otherwise, I can obtain your verbal consent now.  All virtual visits are billed to your insurance company just like a normal visit would be.  By agreeing to a virtual visit, we'd like you to understand that the technology does not allow for your provider to perform an examination, and thus may limit your provider's ability to fully assess your condition. If your provider identifies any concerns that need to be evaluated in person, we will make arrangements to do so.  Finally, though the technology is pretty good, we cannot assure that it will always work on either your or our end, and in the setting of a video visit, we may have to convert it to a phone-only visit.  In either situation, we cannot ensure that we have a secure connection.  Are you willing to proceed?" STAFF: Did the patient verbally acknowledge consent to telehealth visit? Document  YES/NO here: YES  4. Advise patient to be prepared - "Two hours prior to your appointment, go ahead and check your blood pressure, pulse, oxygen saturation, and your weight (if you have the equipment to check those) and write them all down. When your visit starts, your provider will ask you for this information. If you have an Apple Watch or Kardia device, please plan to have heart rate information ready on the day of your appointment. Please have a pen and paper handy nearby the day of the visit as well."  5. Give patient instructions for MyChart download to smartphone OR [email or web address] as below if video visit (depending on what platform provider is using)  6. Inform patient they will receive a phone call 15 minutes prior to their appointment time (may be from unknown caller ID) so they should be prepared to answer    TELEPHONE CALL NOTE  RISHON THILGES has been deemed a candidate for a follow-up tele-health visit to limit community exposure during the Covid-19 pandemic. I spoke with the patient via phone to ensure availability of phone/video source, confirm preferred email & phone number, and discuss instructions and expectations.  I reminded KHALEED HOLAN to be prepared with any vital sign and/or heart rhythm information that could potentially be obtained via home monitoring, at the time of his visit. I reminded DEXTON ZWILLING to expect a phone call prior to  his visit.  Weston Anna 10/04/2018 8:49 AM   INSTRUCTIONS FOR DOWNLOADING THE MYCHART APP TO SMARTPHONE  - The patient must first make sure to have activated MyChart and know their login information - If Apple, go to [employer] and type in MyChart in the search bar and download the app. If Android, ask patient to go to [employer] and type in [place] in the search bar and download the app. The app is free but as with any other app downloads, their phone may require them to verify saved payment information or Apple/Android  password.  - The patient will need to then log into the app with their MyChart username and password, and select [facility] as their healthcare provider to link the account. When it is time for your visit, go to the MyChart app, find appointments, and click Begin Video Visit. Be sure to Select Allow for your device to access the Microphone and Camera for your visit. You will then be connected, and your provider will be with you shortly.  **If they have any issues connecting, or need assistance please contact MyChart service desk [phone])**  **If using a computer, in order to ensure the best quality for their visit they will need to use either of the following Internet Browsers: [employer], or Google Chrome**  IF USING DOXIMITY or DOXY.ME - The patient will receive a link just prior to their visit by text.     FULL LENGTH CONSENT FOR TELE-HEALTH VISIT   I hereby voluntarily request, consent and authorize [facility] and its employed or contracted physicians, physician assistants, nurse practitioners or other licensed health care professionals (the Practitioner), to provide me with telemedicine health care services (the Services") as deemed necessary by the treating Practitioner. I acknowledge and consent to receive the Services by the Practitioner via telemedicine. I understand that the telemedicine visit will involve communicating with the Practitioner through live audiovisual communication technology and the disclosure of certain medical information by electronic transmission. I acknowledge that I have been given the opportunity to request an in-person assessment or other available alternative prior to the telemedicine visit and am voluntarily participating in the telemedicine visit.  I understand that I have the right to withhold or withdraw my consent to the use of telemedicine in the course of my care at any time, without affecting my right to future care or treatment,  and that the Practitioner or I may terminate the telemedicine visit at any time. I understand that I have the right to inspect all information obtained and/or recorded in the course of the telemedicine visit and may receive copies of available information for a reasonable fee.  I understand that some of the potential risks of receiving the Services via telemedicine include:   Delay or interruption in medical evaluation due to technological equipment failure or disruption;  Information transmitted may not be sufficient (e.g. poor resolution of images) to allow for appropriate medical decision making by the Practitioner; and/or   In rare instances, security protocols could fail, causing a breach of personal health information.  Furthermore, I acknowledge that it is my responsibility to provide information about my medical history, conditions and care that is complete and accurate to the best of my ability. I acknowledge that Practitioner's advice, recommendations, and/or decision may be based on factors not within their control, such as incomplete or inaccurate data provided by me or distortions of diagnostic images or specimens that may result from electronic transmissions. I  understand that the practice of medicine is not an exact science and that Practitioner makes no warranties or guarantees regarding treatment outcomes. I acknowledge that I will receive a copy of this consent concurrently upon execution via email to the email address I last provided but may also request a printed copy by calling the office of [facility].    I understand that my insurance will be billed for this visit.   I have read or had this consent read to me.  I understand the contents of this consent, which adequately explains the benefits and risks of the Services being provided via telemedicine.   I have been provided ample opportunity to ask questions regarding this consent and the Services and have had my questions  answered to my satisfaction.  I give my informed consent for the services to be provided through the use of telemedicine in my medical care  By participating in this telemedicine visit I agree to the above.

## 2018-10-06 IMAGING — NM NM MYOCAR MULTI W/SPECT W/WALL MOTION & EF
2 series · 12 of 12 positions shown · non-contrast
Comparison: none

[Series 1: rest · 6.51mm/px · 6 of 64 frames shown]
[frame 6/64]
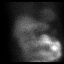
[frame 16/64]
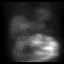
[frame 27/64]
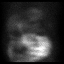
[frame 38/64]
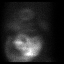
[frame 48/64]
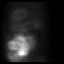
[frame 59/64]
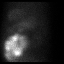

[Series 3: stress gated - perfusion · 6.51mm/px · 6 of 64 frames shown]
[frame 6/64]
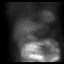
[frame 16/64]
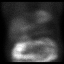
[frame 27/64]
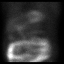
[frame 38/64]
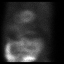
[frame 48/64]
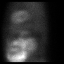
[frame 59/64]
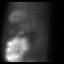

[12 of 12 positions shown; findings below may reference images not displayed]

Canned report from images found in remote index.

Refer to host system for actual result text.

## 2018-10-07 ENCOUNTER — Encounter: Payer: Self-pay | Admitting: Cardiology

## 2018-10-07 ENCOUNTER — Encounter: Payer: Self-pay | Admitting: *Deleted

## 2018-10-07 ENCOUNTER — Telehealth (INDEPENDENT_AMBULATORY_CARE_PROVIDER_SITE_OTHER): Payer: Medicare HMO | Admitting: Cardiology

## 2018-10-07 VITALS — BP 164/81 | HR 56 | Ht 69.0 in | Wt 212.0 lb

## 2018-10-07 DIAGNOSIS — I2510 Atherosclerotic heart disease of native coronary artery without angina pectoris: Secondary | ICD-10-CM

## 2018-10-07 DIAGNOSIS — E782 Mixed hyperlipidemia: Secondary | ICD-10-CM

## 2018-10-07 DIAGNOSIS — R0789 Other chest pain: Secondary | ICD-10-CM

## 2018-10-07 DIAGNOSIS — I10 Essential (primary) hypertension: Secondary | ICD-10-CM

## 2018-10-07 MED ORDER — LISINOPRIL 10 MG PO TABS: 10.0000 mg | ORAL_TABLET | Freq: Every day | ORAL | 3 refills | Status: DC

## 2018-10-07 NOTE — Addendum Note (Signed)
Addended by: Merlene Laughter on: 10/07/2018 10:15 AM   Modules accepted: Orders

## 2018-10-07 NOTE — Progress Notes (Signed)
Virtual Visit via Video Note   This visit type was conducted due to national recommendations for restrictions regarding the COVID-19 Pandemic (e.g. social distancing) in an effort to limit this patient's exposure and mitigate transmission in our community.  Due to his co-morbid illnesses, this patient is at least at moderate risk for complications without adequate follow up.  This format is felt to be most appropriate for this patient at this time.  All issues noted in this document were discussed and addressed.  A limited physical exam was performed with this format.  Please refer to the patient's chart for his consent to telehealth for [facility].   Date:  10/07/2018   ID:  Thomas [name], DOB 04-18-1951, MRN [id number]  Patient Location: Home Provider Location: Office  PCP:  Celene Squibb, MD  Cardiologist:  Carlyle Dolly, MD  Electrophysiologist:  None   Evaluation Performed:  Follow-Up Visit  Chief Complaint:  3 month follow up  History of Present Illness:    Thomas [name] is a 68 y.o. male seen today for follow up of the following medical problems.   1.CAD - history of LIMA-LAD and SVG-OM2 CABG 01/2016. LVEF 50-55% by TEE at the time - 03/2017 echo LVEF 45-50%. Grade Ii diastolic dysfunction.  08/2017 nuclear stress: PVCs, short runs NSVT. Small mild intensity mid to basal inferolatearl defect fixed most consistent with scar. LVEF 44% 10/2017 echo LVEF 50-55%, no WMAs    - last visit he reported some chest pains about 1 month ago.  - pressure mid to left chest, can shoot into back. 5/10 in severity. Often occurs at rest, or wakes up at night. Worst with bending over. No other associated symptoms other than fatigue. Can be constant up to 10 hours. Not related to food.  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 Can be better with advil - takes protonix about only about 20 days out of a month    - mixed compliance with his PPI, encouraged  to take daily at last visit. Emperically tried imdur 15mg  bid at home - metoprolol dosing limited to fatigue and bradycardia, has only been on 12.5mg  daily. Feels much better on lower doses.   - symptoms have improved since last visit.  - he is taking ppi more regularly.  - remains very active doing yard work.     2. Irregular heart beat - PVCs noted on stress test - no recent symptoms - higher beta blocker dose causes fatigue, feels good on toprol 12.5mg  daily  3. HTN - checks bp once a week. Typically 140s-150s /70-80  4. Hyperlipidemia - lipitor caused muscle aches. Simvastatin caused side effects. Tolerating pravaststin ok. - labs are followed by pcp     The patient does not have symptoms concerning for COVID-19 infection (fever, chills, cough, or new shortness of breath).    Past Medical History:  Diagnosis Date   Adenomatous colon polyp 08/2010   Chronic back pain    Coronary artery disease    Diabetes mellitus without complication (HCC)    GERD (gastroesophageal reflux disease)    Hiatal hernia    HTN (hypertension)    Hyperlipidemia    Renal cyst    Large Left   Restless legs    Sleep apnea    uses cpap   Past Surgical History:  Procedure Laterality Date   APPENDECTOMY  1967   CARDIAC CATHETERIZATION N/A 01/31/2016   Procedure: Left Heart Cath and Coronary Angiography;  Surgeon: Belva Crome, MD;  Location: [place] [facility];  Service: Cardiovascular;  Laterality: N/A;   CARDIOVASCULAR STRESS TEST  2004   Treadmill Stress Test   CARDIOVASCULAR STRESS TEST  2010   CHOLECYSTECTOMY  07/2006   COLONOSCOPY  08/2010   RMR: Cecal tubular adenoma removed, otherwise normal exam. next tcs 08/2015   CORONARY ARTERY BYPASS GRAFT N/A 02/28/2016   Procedure: CORONARY ARTERY BYPASS GRAFTING x two using left internal mammary artery and right leg greater saphenous vein harvested endoscopically;  Surgeon: Gaye Pollack, MD;  Location: [place];  Service:  Open Heart Surgery;  Laterality: N/A;   KNEE SURGERY  1995   Micro   TEE WITHOUT CARDIOVERSION N/A 02/28/2016   Procedure: TRANSESOPHAGEAL ECHOCARDIOGRAM (TEE);  Surgeon: Gaye Pollack, MD;  Location: [place];  Service: Open Heart Surgery;  Laterality: N/A;   UPPER GASTROINTESTINAL ENDOSCOPY  08/2010   RMR: GERD benign biopsy   VASECTOMY       No outpatient medications have been marked as taking for the 10/07/18 encounter (Appointment) with Arnoldo Lenis, MD.     Allergies:   Other and Simvastatin   Social History   Tobacco Use   Smoking status: Never Smoker   Smokeless tobacco: Never Used  Substance Use Topics   Alcohol use: Yes    Alcohol/week: 2.0 standard drinks    Types: 2 Standard drinks or equivalent per week    Comment: social   Drug use: No     Family Hx: The patient's family history includes Diabetes in an other family member; Heart attack in an other family member; Lymphoma in his mother; Ulcers in his father and paternal grandfather.  ROS:   Please see the history of present illness.     All other systems reviewed and are negative.   Prior CV studies:   The following studies were reviewed today:  01/2014 Echo Study Conclusions  - Left ventricle: The cavity size was normal. Wall thickness was increased in a pattern of mild LVH. Systolic function was normal. The estimated ejection fraction was in the range of 55% to 60%. Wall motion was normal; there were no regional wall motion abnormalities. Doppler parameters are consistent with abnormal left ventricular relaxation (grade 1 diastolic dysfunction). Doppler parameters are consistent with elevated mean left atrial filling pressure. Medial E/e&' 12.4. - Left atrium: The atrium was moderately dilated. - Right atrium: The atrium was mildly dilated. - Pulmonic valve: There was mild regurgitation.     01/2014 Lexiscan MPI IMPRESSION:  1. Abnormal study. Perfusion imaging suggests mild  region of  ischemia in the mid apical inferolateral wall. The baseline GXT was  however low risk with Duke treadmill score of 8.5, and hypertensive  response.  2. No focal LV wall motion abnormalities by gated imaging.  3. Left ventricular ejection fraction 44% although appears better  visually.  4. Overall low-risk stress test findings*.   07/2015 Exercise MPI  No diagnostic ST segment changes. Occasional PVCs noted during exercise, more frequent in recovery. Nonlimiting chest pain reported with hypertensive response. Intermediate risk Duke treadmill score of 3.5.  Small, moderate intensity, mid to apical inferolateral defect that is reversible consistent with ischemia.  This is an intermediate risk study.  Nuclear stress EF: 37%, appears higher visually. Suggest echocardiogram to further assess.  01/2016 cath  Ost Cx lesion, 75 %stenosed.  LM lesion, 50 %stenosed.  The left ventricular systolic function is normal.  The left ventricular ejection fraction is 50-55%  by visual estimate.  LV end diastolic pressure is mildly elevated.   Eccentric 70-80% proximal/ostial circumflex. Circumflex territory is large.  50% stenosis in the distal left main.  Codominant right coronary  Overall normal LV function with mild elevation and end-diastolic pressure.   03/2017 echo Study Conclusions  - Left ventricle: The cavity size was normal. Wall thickness was increased in a pattern of mild LVH. Systolic function was mildly reduced. The estimated ejection fraction was in the range of 45% to 50%. Diffuse hypokinesis. Features are consistent with a pseudonormal left ventricular filling pattern, with concomitant abnormal relaxation and increased filling pressure (grade 2 diastolic dysfunction). Doppler parameters are consistent with high ventricular filling pressure. - Left atrium: The atrium was mildly dilated. - Pulmonary arteries: PA peak pressure: 31  mm Hg (S).  Impressions:  - Left ventricular systolic function appeared better in some views and worse on others. Overall, LVEF appears low normal to mildly reduced, 45-50%.  01/2016 PFTs Overinflation without specific obstruction  08/2017 nuclear stress  No clearly diagnostic ST segment changes to indicate ischemia. Hypertensive response noted. No chest pain was reported. Occasional frequent PVCs were noted during exercise including 3 beat burst of NSVT near peak after injection of radiotracer. There were no sustained events. Overall low risk Duke treadmill score of 8.5.  Blood pressure demonstrated a hypertensive response to exercise.  Small, mild intensity, mid to basal inferolateral defect that is fixed and most consistent with scar versus attenuation artifact.  This is an intermediate risk study.  Nuclear stress EF: 44%.  10/2017 echo Study Conclusions  - Left ventricle: The cavity size was normal. Wall thickness was normal. Systolic function was normal. The estimated ejection fraction was in the range of 50% to 55%. Wall motion was normal; there were no regional wall motion abnormalities. Indeterminate diastolic function. - Aortic valve: Moderately calcified annulus. Trileaflet. - Aorta: Mild aortic root enlargement. - Mitral valve: There was mild regurgitation.   - 01/2016 PFTs showed evidence of air trapping without specific obstruction.    Labs/Other Tests and Data Reviewed:    EKG:  No ECG reviewed.  Recent Labs: No results found for requested labs within last 8760 hours.   Recent Lipid Panel Lab Results  Component Value Date/Time   CHOL 193 06/20/2011 11:06 AM   TRIG 250 (H) 06/20/2011 11:06 AM   HDL 41 06/20/2011 11:06 AM   CHOLHDL 4.7 06/20/2011 11:06 AM   LDLCALC 102 (H) 06/20/2011 11:06 AM    Wt Readings from Last 3 Encounters:  07/17/18 209 lb 12.8 oz (95.2 kg)  11/20/17 215 lb 12.8 oz (97.9 kg)  10/09/17 216 lb (98 kg)       Objective:    Vital Signs:   Today's Vitals   10/07/18 0831  BP: (!) 164/81  Pulse: (!) 56  Weight: 212 lb (96.2 kg)  Height: 5\' 9"  (1.753 m)   Body mass index is 31.31 kg/m.  Well nourished male sitting comfortable in no distress. Normal affect. Normal speech pattern and tone. No audible or visual signs of SOB or wheezing.   ASSESSMENT & PLAN:    1.CAD -recent atypical chest pains improving with more regular use of his ppi - continue to monitor - continue current meds   2. HTN - above goal, increase lisinopril to 10mg  daily. He has labs with pcp coming up next week  3. Hyperlipidemia - side effects to more potent statins, tolerating pravastatin -request most recent labs from pcp  4. PVCs -  no recent symptoms, did not tolerate higher beta blocker dosing, do well on toprol 12.5mg  daily - continue current meds    COVID-19 Education: The signs and symptoms of COVID-19 were discussed with the patient and how to seek care for testing (follow up with PCP or arrange E-visit).  The importance of social distancing was discussed today.  Time:   Today, I have spent 20 minutes with the patient with telehealth technology discussing the above problems.     Medication Adjustments/Labs and Tests Ordered: Current medicines are reviewed at length with the patient today.  Concerns regarding medicines are outlined above.   Tests Ordered: No orders of the defined types were placed in this encounter.   Medication Changes: No orders of the defined types were placed in this encounter.   Disposition:  Follow up 4 months  Signed, Carlyle Dolly, MD  10/07/2018 8:24 AM    [place]

## 2018-10-07 NOTE — Patient Instructions (Addendum)
Medication Instructions:   Your physician has recommended you make the following change in your medication:  Increase lisinopril to 10 mg daily. You may take (2) of your 5 mg tablets daily until they are finished  Continue all other medications the same  Labwork:  NONE  Testing/Procedures:  NONE  Follow-Up:  Your physician recommends that you schedule a follow-up appointment in: 4 months. You will receive a reminder letter in the mail in about 1-2 months reminding you to call and schedule your appointment. If you don't receive this letter, please contact our office.  Any Other Special Instructions Will Be Listed Below (If Applicable).  If you need a refill on your cardiac medications before your next appointment, please call your pharmacy.

## 2018-10-09 DIAGNOSIS — E1165 Type 2 diabetes mellitus with hyperglycemia: Secondary | ICD-10-CM | POA: Diagnosis not present

## 2018-10-09 DIAGNOSIS — E119 Type 2 diabetes mellitus without complications: Secondary | ICD-10-CM | POA: Diagnosis not present

## 2018-10-09 DIAGNOSIS — E782 Mixed hyperlipidemia: Secondary | ICD-10-CM | POA: Diagnosis not present

## 2018-10-09 DIAGNOSIS — R7301 Impaired fasting glucose: Secondary | ICD-10-CM | POA: Diagnosis not present

## 2018-10-09 DIAGNOSIS — N183 Chronic kidney disease, stage 3 (moderate): Secondary | ICD-10-CM | POA: Diagnosis not present

## 2018-10-09 DIAGNOSIS — K219 Gastro-esophageal reflux disease without esophagitis: Secondary | ICD-10-CM | POA: Diagnosis not present

## 2018-10-09 DIAGNOSIS — I10 Essential (primary) hypertension: Secondary | ICD-10-CM | POA: Diagnosis not present

## 2018-10-09 DIAGNOSIS — D649 Anemia, unspecified: Secondary | ICD-10-CM | POA: Diagnosis not present

## 2018-10-09 DIAGNOSIS — I2510 Atherosclerotic heart disease of native coronary artery without angina pectoris: Secondary | ICD-10-CM | POA: Diagnosis not present

## 2018-10-09 DIAGNOSIS — D509 Iron deficiency anemia, unspecified: Secondary | ICD-10-CM | POA: Diagnosis not present

## 2018-10-16 DIAGNOSIS — I10 Essential (primary) hypertension: Secondary | ICD-10-CM | POA: Diagnosis not present

## 2018-10-16 DIAGNOSIS — K219 Gastro-esophageal reflux disease without esophagitis: Secondary | ICD-10-CM | POA: Diagnosis not present

## 2018-10-16 DIAGNOSIS — E782 Mixed hyperlipidemia: Secondary | ICD-10-CM | POA: Diagnosis not present

## 2018-10-16 DIAGNOSIS — D649 Anemia, unspecified: Secondary | ICD-10-CM | POA: Diagnosis not present

## 2018-10-16 DIAGNOSIS — N183 Chronic kidney disease, stage 3 (moderate): Secondary | ICD-10-CM | POA: Diagnosis not present

## 2018-10-16 DIAGNOSIS — E119 Type 2 diabetes mellitus without complications: Secondary | ICD-10-CM | POA: Diagnosis not present

## 2018-10-16 DIAGNOSIS — D509 Iron deficiency anemia, unspecified: Secondary | ICD-10-CM | POA: Diagnosis not present

## 2018-10-16 DIAGNOSIS — R7301 Impaired fasting glucose: Secondary | ICD-10-CM | POA: Diagnosis not present

## 2018-10-16 DIAGNOSIS — I2510 Atherosclerotic heart disease of native coronary artery without angina pectoris: Secondary | ICD-10-CM | POA: Diagnosis not present

## 2018-10-16 DIAGNOSIS — E1165 Type 2 diabetes mellitus with hyperglycemia: Secondary | ICD-10-CM | POA: Diagnosis not present

## 2018-10-17 DIAGNOSIS — N183 Chronic kidney disease, stage 3 (moderate): Secondary | ICD-10-CM | POA: Diagnosis not present

## 2018-10-17 DIAGNOSIS — E1165 Type 2 diabetes mellitus with hyperglycemia: Secondary | ICD-10-CM | POA: Diagnosis not present

## 2018-10-17 DIAGNOSIS — D509 Iron deficiency anemia, unspecified: Secondary | ICD-10-CM | POA: Diagnosis not present

## 2018-10-17 DIAGNOSIS — R7301 Impaired fasting glucose: Secondary | ICD-10-CM | POA: Diagnosis not present

## 2018-10-17 DIAGNOSIS — E782 Mixed hyperlipidemia: Secondary | ICD-10-CM | POA: Diagnosis not present

## 2018-10-17 DIAGNOSIS — I10 Essential (primary) hypertension: Secondary | ICD-10-CM | POA: Diagnosis not present

## 2018-10-17 DIAGNOSIS — E785 Hyperlipidemia, unspecified: Secondary | ICD-10-CM | POA: Diagnosis not present

## 2018-10-17 DIAGNOSIS — D649 Anemia, unspecified: Secondary | ICD-10-CM | POA: Diagnosis not present

## 2018-10-21 DIAGNOSIS — E782 Mixed hyperlipidemia: Secondary | ICD-10-CM | POA: Diagnosis not present

## 2018-10-21 DIAGNOSIS — R600 Localized edema: Secondary | ICD-10-CM | POA: Diagnosis not present

## 2018-10-21 DIAGNOSIS — E1122 Type 2 diabetes mellitus with diabetic chronic kidney disease: Secondary | ICD-10-CM | POA: Diagnosis not present

## 2018-10-21 DIAGNOSIS — N183 Chronic kidney disease, stage 3 (moderate): Secondary | ICD-10-CM | POA: Diagnosis not present

## 2018-10-21 DIAGNOSIS — E1165 Type 2 diabetes mellitus with hyperglycemia: Secondary | ICD-10-CM | POA: Diagnosis not present

## 2018-10-21 DIAGNOSIS — G9009 Other idiopathic peripheral autonomic neuropathy: Secondary | ICD-10-CM | POA: Diagnosis not present

## 2018-10-21 DIAGNOSIS — N401 Enlarged prostate with lower urinary tract symptoms: Secondary | ICD-10-CM | POA: Diagnosis not present

## 2018-10-21 DIAGNOSIS — I10 Essential (primary) hypertension: Secondary | ICD-10-CM | POA: Diagnosis not present

## 2018-10-21 DIAGNOSIS — I2510 Atherosclerotic heart disease of native coronary artery without angina pectoris: Secondary | ICD-10-CM | POA: Diagnosis not present

## 2018-10-21 DIAGNOSIS — K219 Gastro-esophageal reflux disease without esophagitis: Secondary | ICD-10-CM | POA: Diagnosis not present

## 2018-11-25 DIAGNOSIS — G9009 Other idiopathic peripheral autonomic neuropathy: Secondary | ICD-10-CM | POA: Diagnosis not present

## 2018-11-25 DIAGNOSIS — I2510 Atherosclerotic heart disease of native coronary artery without angina pectoris: Secondary | ICD-10-CM | POA: Diagnosis not present

## 2018-11-25 DIAGNOSIS — I10 Essential (primary) hypertension: Secondary | ICD-10-CM | POA: Diagnosis not present

## 2018-11-25 DIAGNOSIS — K219 Gastro-esophageal reflux disease without esophagitis: Secondary | ICD-10-CM | POA: Diagnosis not present

## 2018-11-25 DIAGNOSIS — E1165 Type 2 diabetes mellitus with hyperglycemia: Secondary | ICD-10-CM | POA: Diagnosis not present

## 2018-11-25 DIAGNOSIS — R600 Localized edema: Secondary | ICD-10-CM | POA: Diagnosis not present

## 2018-11-25 DIAGNOSIS — N183 Chronic kidney disease, stage 3 (moderate): Secondary | ICD-10-CM | POA: Diagnosis not present

## 2018-11-25 DIAGNOSIS — E1122 Type 2 diabetes mellitus with diabetic chronic kidney disease: Secondary | ICD-10-CM | POA: Diagnosis not present

## 2018-11-25 DIAGNOSIS — N401 Enlarged prostate with lower urinary tract symptoms: Secondary | ICD-10-CM | POA: Diagnosis not present

## 2018-11-25 DIAGNOSIS — E782 Mixed hyperlipidemia: Secondary | ICD-10-CM | POA: Diagnosis not present

## 2018-12-18 DIAGNOSIS — E1165 Type 2 diabetes mellitus with hyperglycemia: Secondary | ICD-10-CM | POA: Diagnosis not present

## 2018-12-18 DIAGNOSIS — I10 Essential (primary) hypertension: Secondary | ICD-10-CM | POA: Diagnosis not present

## 2018-12-18 DIAGNOSIS — R600 Localized edema: Secondary | ICD-10-CM | POA: Diagnosis not present

## 2018-12-18 DIAGNOSIS — I2510 Atherosclerotic heart disease of native coronary artery without angina pectoris: Secondary | ICD-10-CM | POA: Diagnosis not present

## 2018-12-18 DIAGNOSIS — E782 Mixed hyperlipidemia: Secondary | ICD-10-CM | POA: Diagnosis not present

## 2018-12-18 DIAGNOSIS — N183 Chronic kidney disease, stage 3 (moderate): Secondary | ICD-10-CM | POA: Diagnosis not present

## 2018-12-18 DIAGNOSIS — E1122 Type 2 diabetes mellitus with diabetic chronic kidney disease: Secondary | ICD-10-CM | POA: Diagnosis not present

## 2018-12-18 DIAGNOSIS — N401 Enlarged prostate with lower urinary tract symptoms: Secondary | ICD-10-CM | POA: Diagnosis not present

## 2018-12-18 DIAGNOSIS — G9009 Other idiopathic peripheral autonomic neuropathy: Secondary | ICD-10-CM | POA: Diagnosis not present

## 2018-12-18 DIAGNOSIS — K219 Gastro-esophageal reflux disease without esophagitis: Secondary | ICD-10-CM | POA: Diagnosis not present

## 2019-01-24 DIAGNOSIS — I2510 Atherosclerotic heart disease of native coronary artery without angina pectoris: Secondary | ICD-10-CM | POA: Diagnosis not present

## 2019-01-24 DIAGNOSIS — E782 Mixed hyperlipidemia: Secondary | ICD-10-CM | POA: Diagnosis not present

## 2019-01-24 DIAGNOSIS — E1165 Type 2 diabetes mellitus with hyperglycemia: Secondary | ICD-10-CM | POA: Diagnosis not present

## 2019-01-24 DIAGNOSIS — R600 Localized edema: Secondary | ICD-10-CM | POA: Diagnosis not present

## 2019-01-24 DIAGNOSIS — N401 Enlarged prostate with lower urinary tract symptoms: Secondary | ICD-10-CM | POA: Diagnosis not present

## 2019-01-24 DIAGNOSIS — E1122 Type 2 diabetes mellitus with diabetic chronic kidney disease: Secondary | ICD-10-CM | POA: Diagnosis not present

## 2019-01-24 DIAGNOSIS — K219 Gastro-esophageal reflux disease without esophagitis: Secondary | ICD-10-CM | POA: Diagnosis not present

## 2019-01-24 DIAGNOSIS — G9009 Other idiopathic peripheral autonomic neuropathy: Secondary | ICD-10-CM | POA: Diagnosis not present

## 2019-01-24 DIAGNOSIS — I10 Essential (primary) hypertension: Secondary | ICD-10-CM | POA: Diagnosis not present

## 2019-01-24 DIAGNOSIS — N183 Chronic kidney disease, stage 3 (moderate): Secondary | ICD-10-CM | POA: Diagnosis not present

## 2019-02-09 ENCOUNTER — Other Ambulatory Visit: Payer: Self-pay | Admitting: Cardiology

## 2019-03-19 DIAGNOSIS — I10 Essential (primary) hypertension: Secondary | ICD-10-CM | POA: Diagnosis not present

## 2019-03-19 DIAGNOSIS — G9009 Other idiopathic peripheral autonomic neuropathy: Secondary | ICD-10-CM | POA: Diagnosis not present

## 2019-03-19 DIAGNOSIS — E782 Mixed hyperlipidemia: Secondary | ICD-10-CM | POA: Diagnosis not present

## 2019-03-19 DIAGNOSIS — R600 Localized edema: Secondary | ICD-10-CM | POA: Diagnosis not present

## 2019-03-19 DIAGNOSIS — K219 Gastro-esophageal reflux disease without esophagitis: Secondary | ICD-10-CM | POA: Diagnosis not present

## 2019-03-19 DIAGNOSIS — E1165 Type 2 diabetes mellitus with hyperglycemia: Secondary | ICD-10-CM | POA: Diagnosis not present

## 2019-03-19 DIAGNOSIS — I2510 Atherosclerotic heart disease of native coronary artery without angina pectoris: Secondary | ICD-10-CM | POA: Diagnosis not present

## 2019-03-19 DIAGNOSIS — N401 Enlarged prostate with lower urinary tract symptoms: Secondary | ICD-10-CM | POA: Diagnosis not present

## 2019-03-19 DIAGNOSIS — E1122 Type 2 diabetes mellitus with diabetic chronic kidney disease: Secondary | ICD-10-CM | POA: Diagnosis not present

## 2019-04-18 ENCOUNTER — Other Ambulatory Visit: Payer: Self-pay | Admitting: Cardiology

## 2019-05-13 DIAGNOSIS — I129 Hypertensive chronic kidney disease with stage 1 through stage 4 chronic kidney disease, or unspecified chronic kidney disease: Secondary | ICD-10-CM | POA: Diagnosis not present

## 2019-05-13 DIAGNOSIS — I2510 Atherosclerotic heart disease of native coronary artery without angina pectoris: Secondary | ICD-10-CM | POA: Diagnosis not present

## 2019-05-13 DIAGNOSIS — K219 Gastro-esophageal reflux disease without esophagitis: Secondary | ICD-10-CM | POA: Diagnosis not present

## 2019-05-13 DIAGNOSIS — E782 Mixed hyperlipidemia: Secondary | ICD-10-CM | POA: Diagnosis not present

## 2019-05-13 DIAGNOSIS — N183 Chronic kidney disease, stage 3 unspecified: Secondary | ICD-10-CM | POA: Diagnosis not present

## 2019-05-13 DIAGNOSIS — I10 Essential (primary) hypertension: Secondary | ICD-10-CM | POA: Diagnosis not present

## 2019-05-13 DIAGNOSIS — E1165 Type 2 diabetes mellitus with hyperglycemia: Secondary | ICD-10-CM | POA: Diagnosis not present

## 2019-05-13 DIAGNOSIS — E7849 Other hyperlipidemia: Secondary | ICD-10-CM | POA: Diagnosis not present

## 2019-05-13 DIAGNOSIS — G9009 Other idiopathic peripheral autonomic neuropathy: Secondary | ICD-10-CM | POA: Diagnosis not present

## 2019-05-13 DIAGNOSIS — E1122 Type 2 diabetes mellitus with diabetic chronic kidney disease: Secondary | ICD-10-CM | POA: Diagnosis not present

## 2019-05-29 ENCOUNTER — Other Ambulatory Visit: Payer: Self-pay

## 2019-05-29 ENCOUNTER — Encounter: Payer: Self-pay | Admitting: Cardiology

## 2019-05-29 ENCOUNTER — Ambulatory Visit (INDEPENDENT_AMBULATORY_CARE_PROVIDER_SITE_OTHER): Payer: Medicare HMO | Admitting: Cardiology

## 2019-05-29 VITALS — BP 122/73 | HR 52 | Ht 69.0 in | Wt 213.8 lb

## 2019-05-29 DIAGNOSIS — I2510 Atherosclerotic heart disease of native coronary artery without angina pectoris: Secondary | ICD-10-CM

## 2019-05-29 DIAGNOSIS — R600 Localized edema: Secondary | ICD-10-CM

## 2019-05-29 DIAGNOSIS — E782 Mixed hyperlipidemia: Secondary | ICD-10-CM | POA: Diagnosis not present

## 2019-05-29 DIAGNOSIS — I493 Ventricular premature depolarization: Secondary | ICD-10-CM

## 2019-05-29 DIAGNOSIS — I10 Essential (primary) hypertension: Secondary | ICD-10-CM

## 2019-05-29 MED ORDER — ISOSORBIDE MONONITRATE ER 30 MG PO TB24: 30.0000 mg | ORAL_TABLET | Freq: Two times a day (BID) | ORAL | 1 refills | Status: DC

## 2019-05-29 MED ORDER — NITROGLYCERIN 0.4 MG SL SUBL: 0.4000 mg | SUBLINGUAL_TABLET | SUBLINGUAL | 3 refills | Status: DC | PRN

## 2019-05-29 NOTE — Patient Instructions (Signed)
Your physician recommends that you schedule a follow-up appointment in: [place] has recommended you make the following change in your medication:   INCREASE IMDUR 30 MG TWICE DAILY   NITROGLYCERIN 0.4 MG AS NEEDED FOR CHEST PAIN   Nitroglycerin sublingual tablets What is this medicine? NITROGLYCERIN (nye troe GLI ser in) is a type of vasodilator. It relaxes blood vessels, increasing the blood and oxygen supply to your heart. This medicine is used to relieve chest pain caused by angina. It is also used to prevent chest pain before activities like climbing stairs, going outdoors in cold weather, or sexual activity. This medicine may be used for other purposes; ask your health care provider or pharmacist if you have questions. COMMON BRAND NAME(S): Nitroquick, Nitrostat, Nitrotab What should I tell my health care provider before I take this medicine? They need to know if you have any of these conditions:  anemia  head injury, recent stroke, or bleeding in the brain  liver disease  previous heart attack  an unusual or allergic reaction to nitroglycerin, other medicines, foods, dyes, or preservatives  pregnant or trying to get pregnant  breast-feeding How should I use this medicine? Take this medicine by mouth as needed. At the first sign of an angina attack (chest pain or tightness) place one tablet under your tongue. You can also take this medicine 5 to 10 minutes before an event likely to produce chest pain. Follow the directions on the prescription label. Let the tablet dissolve under the tongue. Do not swallow whole. Replace the dose if you accidentally swallow it. It will help if your mouth is not dry. Saliva around the tablet will help it to dissolve more quickly. Do not eat or drink, smoke or chew tobacco while a tablet is dissolving. If you are not better within 5 minutes after taking ONE dose of nitroglycerin, call 9-1-1 immediately to seek emergency  medical care. Do not take more than 3 nitroglycerin tablets over 15 minutes. If you take this medicine often to relieve symptoms of angina, your doctor or health care professional may provide you with different instructions to manage your symptoms. If symptoms do not go away after following these instructions, it is important to call 9-1-1 immediately. Do not take more than 3 nitroglycerin tablets over 15 minutes. Talk to your pediatrician regarding the use of this medicine in children. Special care may be needed. Overdosage: If you think you have taken too much of this medicine contact a poison control center or emergency room at once. NOTE: This medicine is only for you. Do not share this medicine with others. What if I miss a dose? This does not apply. This medicine is only used as needed. What may interact with this medicine? Do not take this medicine with any of the following medications:  certain migraine medicines like ergotamine and dihydroergotamine (DHE)  medicines used to treat erectile dysfunction like sildenafil, tadalafil, and vardenafil  riociguat This medicine may also interact with the following medications:  alteplase  aspirin  heparin  medicines for high blood pressure  medicines for mental depression  other medicines used to treat angina  phenothiazines like chlorpromazine, mesoridazine, prochlorperazine, thioridazine This list may not describe all possible interactions. Give your health care provider a list of all the medicines, herbs, non-prescription drugs, or dietary supplements you use. Also tell them if you smoke, drink alcohol, or use illegal drugs. Some items may interact with your medicine. What should I watch  for while using this medicine? Tell your doctor or health care professional if you feel your medicine is no longer working. Keep this medicine with you at all times. Sit or lie down when you take your medicine to prevent falling if you feel dizzy or  faint after using it. Try to remain calm. This will help you to feel better faster. If you feel dizzy, take several deep breaths and lie down with your feet propped up, or bend forward with your head resting between your knees. You may get drowsy or dizzy. Do not drive, use machinery, or do anything that needs mental alertness until you know how this drug affects you. Do not stand or sit up quickly, especially if you are an older patient. This reduces the risk of dizzy or fainting spells. Alcohol can make you more drowsy and dizzy. Avoid alcoholic drinks. Do not treat yourself for coughs, colds, or pain while you are taking this medicine without asking your doctor or health care professional for advice. Some ingredients may increase your blood pressure. What side effects may I notice from receiving this medicine? Side effects that you should report to your doctor or health care professional as soon as possible:  blurred vision  dry mouth  skin rash  sweating  the feeling of extreme pressure in the head  unusually weak or tired Side effects that usually do not require medical attention (report to your doctor or health care professional if they continue or are bothersome):  flushing of the face or neck  headache  irregular heartbeat, palpitations  nausea, vomiting This list may not describe all possible side effects. Call your doctor for medical advice about side effects. You may report side effects to FDA at 1-800-FDA-1088. Where should I keep my medicine? Keep out of the reach of children. Store at room temperature between 20 and 25 degrees C (68 and 77 degrees F). Store in [employer]. Protect from light and moisture. Keep tightly closed. Throw away any unused medicine after the expiration date. NOTE: This sheet is a summary. It may not cover all possible information. If you have questions about this medicine, talk to your doctor, pharmacist, or health care provider.  2020  Elsevier/Gold Standard (2013-02-13 17:57:36)

## 2019-05-29 NOTE — Progress Notes (Signed)
Clinical Summary Mr. Langolf is a 69 y.o.male seen today for follow up of the following medical problems.  1.CAD - history of LIMA-LAD and SVG-OM2 CABG 01/2016. LVEF 50-55% by TEE at the time - 03/2017 echo LVEF 45-50%. Grade Ii diastolic dysfunction.  08/2017 nuclear stress: PVCs, short runs NSVT. Small mild intensity mid to basal inferolatearl defect fixed most consistent with scar. LVEF 44% 10/2017 echo LVEF 50-55%, no WMAs   - metoprolol dosing limited to fatigue and bradycardia, has only been on 12.5mg  daily. Feels    - shooting like pain epigastric midchest typically at night. Lasts about 30 minutes. Can be better with position changes. Mild SOB at times. Reports PPI compliance, had been an issue berfore - no exertional symptoms.    2. Irregular heart beat -PVCs noted on stress test - higher beta blocker dose causes fatigue, feels good on toprol 12.5mg  daily  - no recent palpitations  3. HTN - compliant with meds  4. Hyperlipidemia - lipitor caused muscle aches. Simvastatin caused side effects. Currently on crestor 10mg  daily, tolerating.  - labs followed by pcp    5. LE edema - controlled with prn lasix    Past Medical History:  Diagnosis Date  . Adenomatous colon polyp 08/2010  . Chronic back pain   . Coronary artery disease   . Diabetes mellitus without complication ([place])   . GERD (gastroesophageal reflux disease)   . Hiatal hernia   . HTN (hypertension)   . Hyperlipidemia   . Renal cyst    Large Left  . Restless legs   . Sleep apnea    uses cpap     Allergies  Allergen Reactions  . Ambien [Zolpidem Tartrate] Other (See Comments)    Caused memory problems  . Other     Medication for insomnia  . Simvastatin     REACTION: arm numbness Patient currently on this medication (03/2012).     Current Outpatient Medications  Medication Sig Dispense Refill  . amLODipine (NORVASC) 10 MG tablet Take 1 tablet (10 mg total) daily by  mouth. 180 tablet 3  . aspirin EC 81 MG tablet Take 81 mg daily by mouth.    . furosemide (LASIX) 40 MG tablet TAKE 1.5 TABLETS (60 MG TOTAL) BY MOUTH AS NEEDED. 135 tablet 0  . gabapentin (NEURONTIN) 300 MG capsule Take 300 mg by mouth at bedtime.    . isosorbide mononitrate (IMDUR) 30 MG 24 hr tablet TAKE 0.5 TABLETS (15 MG TOTAL) BY MOUTH 2 (TWO) TIMES DAILY. 90 tablet 0  . lisinopril (ZESTRIL) 10 MG tablet Take 1 tablet (10 mg total) by mouth daily. 90 tablet 3  . metFORMIN (GLUCOPHAGE) 500 MG tablet Take 500 mg by mouth 2 (two) times daily with a meal.    . metoprolol succinate (TOPROL XL) 25 MG 24 hr tablet Take 0.5 tablets (12.5 mg total) by mouth daily. 45 tablet 3  . mometasone (NASONEX) 50 MCG/ACT nasal spray Place 2 sprays into the nose daily as needed (for nasal congestion).    . pantoprazole (PROTONIX) 40 MG tablet Take 40 mg by mouth at bedtime.     . pravastatin (PRAVACHOL) 20 MG tablet Take 1 tablet (20 mg total) by mouth every evening. 90 tablet 3  . triamterene-hydrochlorothiazide (DYAZIDE) 37.5-25 MG capsule Take 1 capsule by mouth every morning.      No current facility-administered medications for this visit.     Past Surgical History:  Procedure Laterality Date  . APPENDECTOMY  [place] N/A 01/31/2016   Procedure: Left Heart Cath and Coronary Angiography;  Surgeon: Belva Crome, MD;  Location: [facility];  Service: Cardiovascular;  Laterality: N/A;  . CARDIOVASCULAR STRESS TEST  2004   Treadmill Stress Test  . CARDIOVASCULAR STRESS TEST  2010  . CHOLECYSTECTOMY  07/2006  . COLONOSCOPY  08/2010   RMR: Cecal tubular adenoma removed, otherwise normal exam. next tcs 08/2015  . CORONARY ARTERY BYPASS GRAFT N/A 02/28/2016   Procedure: CORONARY ARTERY BYPASS GRAFTING x two using left internal mammary artery and right leg greater saphenous vein harvested endoscopically;  Surgeon: Gaye Pollack, MD;  Location: [facility];  Service: Open Heart Surgery;   Laterality: N/A;  . [place]  . TEE WITHOUT CARDIOVERSION N/A 02/28/2016   Procedure: TRANSESOPHAGEAL ECHOCARDIOGRAM (TEE);  Surgeon: Gaye Pollack, MD;  Location: [facility];  Service: Open Heart Surgery;  Laterality: N/A;  . UPPER GASTROINTESTINAL ENDOSCOPY  08/2010   RMR: GERD benign biopsy  . VASECTOMY       Allergies  Allergen Reactions  . Ambien [Zolpidem Tartrate] Other (See Comments)    Caused memory problems  . Other     Medication for insomnia  . Simvastatin     REACTION: arm numbness Patient currently on this medication (03/2012).      Family History  Problem Relation Age of Onset  . Lymphoma Mother   . Ulcers Father   . Ulcers Paternal Grandfather        Stomach ulcer, resection  . Heart attack Other   . Diabetes Other      Social History Mr. William reports that he has never smoked. He has never used smokeless tobacco. Mr. Raimondo reports current alcohol use of about 2.0 standard drinks of alcohol per week.   Review of Systems CONSTITUTIONAL: No weight loss, fever, chills, weakness or fatigue.  HEENT: Eyes: No visual loss, blurred vision, double vision or yellow sclerae.No hearing loss, sneezing, congestion, runny nose or sore throat.  SKIN: No rash or itching.  CARDIOVASCULAR: per hpi RESPIRATORY: No shortness of breath, cough or sputum.  GASTROINTESTINAL: No anorexia, nausea, vomiting or diarrhea. No abdominal pain or blood.  GENITOURINARY: No burning on urination, no polyuria NEUROLOGICAL: No headache, dizziness, syncope, paralysis, ataxia, numbness or tingling in the extremities. No change in bowel or bladder control.  MUSCULOSKELETAL: No muscle, back pain, joint pain or stiffness.  LYMPHATICS: No enlarged nodes. No history of splenectomy.  PSYCHIATRIC: No history of depression or anxiety.  ENDOCRINOLOGIC: No reports of sweating, cold or heat intolerance. No polyuria or polydipsia.  Marland Kitchen   Physical Examination Today's Vitals   05/29/19  1437  BP: 122/73  Pulse: (!) 52  SpO2: 99%  Weight: 213 lb 12.8 oz (97 kg)  Height: 5\' 9"  (1.753 m)   Body mass index is 31.57 kg/m.  Gen: resting comfortably, no acute distress HEENT: no scleral icterus, pupils equal round and reactive, no palptable cervical adenopathy,  CV: RRR, no m/r,g no jvd Resp: Clear to auscultation bilaterally GI: abdomen is soft, non-tender, non-distended, normal bowel sounds, no hepatosplenomegaly MSK: extremities are warm, no edema.  Skin: warm, no rash Neuro:  no focal deficits Psych: appropriate affect   Diagnostic Studies 01/2014 Echo Study Conclusions  - Left ventricle: The cavity size was normal. Wall thickness was increased in a pattern of mild LVH. Systolic function was normal. The estimated ejection fraction was in the range of 55% to  60%. Wall motion was normal; there were no regional wall motion abnormalities. Doppler parameters are consistent with abnormal left ventricular relaxation (grade 1 diastolic dysfunction). Doppler parameters are consistent with elevated mean left atrial filling pressure. Medial E/e&' 12.4. - Left atrium: The atrium was moderately dilated. - Right atrium: The atrium was mildly dilated. - Pulmonic valve: There was mild regurgitation.     01/2014 Lexiscan MPI IMPRESSION:  1. Abnormal study. Perfusion imaging suggests mild region of  ischemia in the mid apical inferolateral wall. The baseline GXT was  however low risk with Duke treadmill score of 8.5, and hypertensive  response.  2. No focal LV wall motion abnormalities by gated imaging.  3. Left ventricular ejection fraction 44% although appears better  visually.  4. Overall low-risk stress test findings*.   07/2015 Exercise MPI  No diagnostic ST segment changes. Occasional PVCs noted during exercise, more frequent in recovery. Nonlimiting chest pain reported with hypertensive response. Intermediate risk Duke treadmill score of  3.5.  Small, moderate intensity, mid to apical inferolateral defect that is reversible consistent with ischemia.  This is an intermediate risk study.  Nuclear stress EF: 37%, appears higher visually. Suggest echocardiogram to further assess.  01/2016 cath  Ost Cx lesion, 75 %stenosed.  LM lesion, 50 %stenosed.  The left ventricular systolic function is normal.  The left ventricular ejection fraction is 50-55% by visual estimate.  LV end diastolic pressure is mildly elevated.   Eccentric 70-80% proximal/ostial circumflex. Circumflex territory is large.  50% stenosis in the distal left main.  Codominant right coronary  Overall normal LV function with mild elevation and end-diastolic pressure.   03/2017 echo Study Conclusions  - Left ventricle: The cavity size was normal. Wall thickness was increased in a pattern of mild LVH. Systolic function was mildly reduced. The estimated ejection fraction was in the range of 45% to 50%. Diffuse hypokinesis. Features are consistent with a pseudonormal left ventricular filling pattern, with concomitant abnormal relaxation and increased filling pressure (grade 2 diastolic dysfunction). Doppler parameters are consistent with high ventricular filling pressure. - Left atrium: The atrium was mildly dilated. - Pulmonary arteries: PA peak pressure: 31 mm Hg (S).  Impressions:  - Left ventricular systolic function appeared better in some views and worse on others. Overall, LVEF appears low normal to mildly reduced, 45-50%.  01/2016 PFTs Overinflation without specific obstruction  08/2017 nuclear stress  No clearly diagnostic ST segment changes to indicate ischemia. Hypertensive response noted. No chest pain was reported. Occasional frequent PVCs were noted during exercise including 3 beat burst of NSVT near peak after injection of radiotracer. There were no sustained events. Overall low risk Duke treadmill  score of 8.5.  Blood pressure demonstrated a hypertensive response to exercise.  Small, mild intensity, mid to basal inferolateral defect that is fixed and most consistent with scar versus attenuation artifact.  This is an intermediate risk study.  Nuclear stress EF: 44%.  10/2017 echo Study Conclusions  - Left ventricle: The cavity size was normal. Wall thickness was normal. Systolic function was normal. The estimated ejection fraction was in the range of 50% to 55%. Wall motion was normal; there were no regional wall motion abnormalities. Indeterminate diastolic function. - Aortic valve: Moderately calcified annulus. Trileaflet. - Aorta: Mild aortic root enlargement. - Mitral valve: There was mild regurgitation.   - 01/2016 PFTsshowed evidence of air trapping without specific obstruction.     Assessment and Plan   1.CAD -has had varying atypical chest pain over the  last several years. Recent symptoms are not consistent with ischemia - continue current meds. Try taking imdur 30mg  bid - if significant change or progression of symptoms consider repeat ischemic testing at that time.  - EKG shows sinus brady, no acute ischemic changes  2. HTN - at goal, continue current meds  3. Hyperlipidemia -tolerating rosuvatstatin, continue. Obtain pcp labs.   4. PVCs - did not tolerate higher beta blocker dosing - no symptoms, continue current meds  5. LE edema - controlled with prn lasix. Describes very strong urge to urinate when takes 60mg  prn, told can try taking 20-40mg  at a time instead, can split doses to bid if better tolerated.    F/u 4 months  Arnoldo Lenis, M.D.

## 2019-06-12 DIAGNOSIS — E785 Hyperlipidemia, unspecified: Secondary | ICD-10-CM | POA: Diagnosis not present

## 2019-06-12 DIAGNOSIS — D649 Anemia, unspecified: Secondary | ICD-10-CM | POA: Diagnosis not present

## 2019-06-12 DIAGNOSIS — E119 Type 2 diabetes mellitus without complications: Secondary | ICD-10-CM | POA: Diagnosis not present

## 2019-06-12 DIAGNOSIS — E1165 Type 2 diabetes mellitus with hyperglycemia: Secondary | ICD-10-CM | POA: Diagnosis not present

## 2019-06-12 DIAGNOSIS — E1122 Type 2 diabetes mellitus with diabetic chronic kidney disease: Secondary | ICD-10-CM | POA: Diagnosis not present

## 2019-06-12 DIAGNOSIS — I10 Essential (primary) hypertension: Secondary | ICD-10-CM | POA: Diagnosis not present

## 2019-06-12 DIAGNOSIS — E782 Mixed hyperlipidemia: Secondary | ICD-10-CM | POA: Diagnosis not present

## 2019-06-12 DIAGNOSIS — D509 Iron deficiency anemia, unspecified: Secondary | ICD-10-CM | POA: Diagnosis not present

## 2019-06-12 DIAGNOSIS — R972 Elevated prostate specific antigen [PSA]: Secondary | ICD-10-CM | POA: Diagnosis not present

## 2019-06-12 DIAGNOSIS — R7301 Impaired fasting glucose: Secondary | ICD-10-CM | POA: Diagnosis not present

## 2019-06-16 DIAGNOSIS — E1122 Type 2 diabetes mellitus with diabetic chronic kidney disease: Secondary | ICD-10-CM | POA: Diagnosis not present

## 2019-06-16 DIAGNOSIS — K219 Gastro-esophageal reflux disease without esophagitis: Secondary | ICD-10-CM | POA: Diagnosis not present

## 2019-06-16 DIAGNOSIS — E782 Mixed hyperlipidemia: Secondary | ICD-10-CM | POA: Diagnosis not present

## 2019-06-16 DIAGNOSIS — Z0001 Encounter for general adult medical examination with abnormal findings: Secondary | ICD-10-CM | POA: Diagnosis not present

## 2019-06-16 DIAGNOSIS — N401 Enlarged prostate with lower urinary tract symptoms: Secondary | ICD-10-CM | POA: Diagnosis not present

## 2019-06-16 DIAGNOSIS — R1030 Lower abdominal pain, unspecified: Secondary | ICD-10-CM | POA: Diagnosis not present

## 2019-06-16 DIAGNOSIS — I10 Essential (primary) hypertension: Secondary | ICD-10-CM | POA: Diagnosis not present

## 2019-06-16 DIAGNOSIS — I2510 Atherosclerotic heart disease of native coronary artery without angina pectoris: Secondary | ICD-10-CM | POA: Diagnosis not present

## 2019-06-16 DIAGNOSIS — G9009 Other idiopathic peripheral autonomic neuropathy: Secondary | ICD-10-CM | POA: Diagnosis not present

## 2019-06-16 DIAGNOSIS — E1165 Type 2 diabetes mellitus with hyperglycemia: Secondary | ICD-10-CM | POA: Diagnosis not present

## 2019-06-18 ENCOUNTER — Other Ambulatory Visit: Payer: Self-pay | Admitting: Internal Medicine

## 2019-06-18 ENCOUNTER — Other Ambulatory Visit (HOSPITAL_COMMUNITY): Payer: Self-pay | Admitting: Internal Medicine

## 2019-06-18 DIAGNOSIS — C44729 Squamous cell carcinoma of skin of left lower limb, including hip: Secondary | ICD-10-CM | POA: Diagnosis not present

## 2019-06-18 DIAGNOSIS — L821 Other seborrheic keratosis: Secondary | ICD-10-CM | POA: Diagnosis not present

## 2019-06-18 DIAGNOSIS — N281 Cyst of kidney, acquired: Secondary | ICD-10-CM

## 2019-06-18 DIAGNOSIS — Z85828 Personal history of other malignant neoplasm of skin: Secondary | ICD-10-CM | POA: Diagnosis not present

## 2019-06-18 DIAGNOSIS — R1030 Lower abdominal pain, unspecified: Secondary | ICD-10-CM

## 2019-07-09 ENCOUNTER — Ambulatory Visit (HOSPITAL_COMMUNITY)
Admission: RE | Admit: 2019-07-09 | Discharge: 2019-07-09 | Disposition: A | Discharge status: Home / Self Care | Payer: Medicare HMO | Source: Ambulatory Visit | Attending: Internal Medicine | Admitting: Internal Medicine

## 2019-07-09 ENCOUNTER — Other Ambulatory Visit: Payer: Self-pay

## 2019-07-09 DIAGNOSIS — N281 Cyst of kidney, acquired: Secondary | ICD-10-CM | POA: Diagnosis not present

## 2019-07-09 DIAGNOSIS — R1030 Lower abdominal pain, unspecified: Secondary | ICD-10-CM | POA: Diagnosis present

## 2019-07-09 MED ORDER — IOHEXOL 300 MG/ML  SOLN
100.0000 mL | Freq: Once | INTRAMUSCULAR | Status: AC | PRN
  Administered 2019-07-09: 09:00:00 100 mL via INTRAVENOUS

## 2019-07-21 ENCOUNTER — Other Ambulatory Visit: Payer: Self-pay | Admitting: Cardiology

## 2019-08-20 ENCOUNTER — Encounter: Payer: Self-pay | Admitting: Urology

## 2019-08-20 ENCOUNTER — Ambulatory Visit: Payer: Medicare HMO | Admitting: Urology

## 2019-08-20 ENCOUNTER — Other Ambulatory Visit: Payer: Self-pay

## 2019-08-20 VITALS — BP 125/60 | HR 54 | Temp 96.8°F | Ht 69.0 in | Wt 205.0 lb

## 2019-08-20 DIAGNOSIS — N401 Enlarged prostate with lower urinary tract symptoms: Secondary | ICD-10-CM

## 2019-08-20 DIAGNOSIS — N281 Cyst of kidney, acquired: Secondary | ICD-10-CM

## 2019-08-20 DIAGNOSIS — N138 Other obstructive and reflux uropathy: Secondary | ICD-10-CM

## 2019-08-20 DIAGNOSIS — R339 Retention of urine, unspecified: Secondary | ICD-10-CM | POA: Diagnosis not present

## 2019-08-20 LAB — POCT URINALYSIS DIPSTICK
Bilirubin, UA: NEGATIVE
Blood, UA: NEGATIVE
Glucose, UA: POSITIVE — AB
Ketones, UA: NEGATIVE
Leukocytes, UA: NEGATIVE
Nitrite, UA: NEGATIVE
Protein, UA: NEGATIVE
Spec Grav, UA: 1.01 (ref 1.010–1.025)
Urobilinogen, UA: NEGATIVE E.U./dL — AB
pH, UA: 8.5 — AB (ref 5.0–8.0)

## 2019-08-20 LAB — BLADDER SCAN AMB NON-IMAGING: Scan Result: 130.6

## 2019-08-20 MED ORDER — ALFUZOSIN HCL ER 10 MG PO TB24: 10.0000 mg | ORAL_TABLET | Freq: Every day | ORAL | 11 refills | Status: DC

## 2019-08-20 NOTE — Progress Notes (Signed)
Urological Symptom Review  Patient is experiencing the following symptoms: Frequent urination Hard to postpone urination Get up at night to urinate Urinary tract infection Painful intercourse Erection problems (male only)   Review of Systems  Gastrointestinal (upper)  : Negative for upper GI symptoms  Gastrointestinal (lower) : Negative for lower GI symptoms  Constitutional : Fatigue  Skin: Negative for skin symptoms  Eyes: Negative for eye symptoms  Ear/Nose/Throat : Sinus problems  Hematologic/Lymphatic: Negative for Hematologic/Lymphatic symptoms  Cardiovascular : Leg swelling  Respiratory : Negative for respiratory symptoms  Endocrine: Excessive thirst  Musculoskeletal: Back pain Joint pain  Neurological: Negative for neurological symptoms  Psychologic: Negative for psychiatric symptoms

## 2019-08-20 NOTE — Progress Notes (Signed)
08/20/2019 10:51 AM   [name] Mar 17, 1951 [id number]  Referring provider: Celene Squibb, MD [place],  [place] [id number]  Renal cyst and nocturia/urinary frequency  HPI: Mr Bouwman is a 69yo here for evaluation of a left renal cyst and worsening LUTS. For the past 2 years he has noted worsening urgency, frequency, nocturia 3-4x. Stream intermittently weak. He has intermittent suprpapubic pressure. PVR 130cc He is on lasix, HCTZ and jardiance. No dysuria or hematuria. He has a known Bosniak 1 cyst 10cm that was confirmed on 07/09/2019 with CT. No left flank pain   PMH: Past Medical History:  Diagnosis Date  . Acid reflux   . Adenomatous colon polyp 08/2010  . Anxiety   . Arthritis   . Chronic back pain   . Coronary artery disease   . Diabetes mellitus without complication ([place])   . GERD (gastroesophageal reflux disease)   . Gout   . Hiatal hernia   . High cholesterol   . HTN (hypertension)   . Hyperlipidemia   . Renal cyst    Large Left  . Restless legs   . Sleep apnea    uses cpap    Surgical History: Past Surgical History:  Procedure Laterality Date  . APPENDECTOMY  1967  . CARDIAC CATHETERIZATION N/A 01/31/2016   Procedure: Left Heart Cath and Coronary Angiography;  Surgeon: Belva Crome, MD;  Location: [facility];  Service: Cardiovascular;  Laterality: N/A;  . CARDIOVASCULAR STRESS TEST  2004   Treadmill Stress Test  . CARDIOVASCULAR STRESS TEST  2010  . CHOLECYSTECTOMY  07/2006  . COLONOSCOPY  08/2010   RMR: Cecal tubular adenoma removed, otherwise normal exam. next tcs 08/2015  . CORONARY ARTERY BYPASS GRAFT N/A 02/28/2016   Procedure: CORONARY ARTERY BYPASS GRAFTING x two using left internal mammary artery and right leg greater saphenous vein harvested endoscopically;  Surgeon: Gaye Pollack, MD;  Location: [facility];  Service: Open Heart Surgery;  Laterality: N/A;  . [place]  . TEE WITHOUT CARDIOVERSION N/A  02/28/2016   Procedure: TRANSESOPHAGEAL ECHOCARDIOGRAM (TEE);  Surgeon: Gaye Pollack, MD;  Location: [facility];  Service: Open Heart Surgery;  Laterality: N/A;  . UPPER GASTROINTESTINAL ENDOSCOPY  08/2010   RMR: GERD benign biopsy  . VASECTOMY      Home Medications:  Allergies as of 08/20/2019      Reactions   Ambien [zolpidem Tartrate] Other (See Comments)   Caused memory problems   Other    Medication for insomnia   Simvastatin    REACTION: arm numbness Patient currently on this medication (03/2012).      Medication List       Accurate as of August 20, 2019 10:51 AM. If you have any questions, ask your nurse or doctor.        amLODipine 10 MG tablet Commonly known as: NORVASC Take 1 tablet (10 mg total) daily by mouth.   aspirin EC 81 MG tablet Take 81 mg daily by mouth.   furosemide 40 MG tablet Commonly known as: LASIX TAKE 1.5 TABLETS (60 MG TOTAL) BY MOUTH AS NEEDED.   gabapentin 300 MG capsule Commonly known as: NEURONTIN Take 300 mg by mouth at bedtime.   Glucophage 500 MG tablet Generic drug: metFORMIN Take 500 mg by mouth 2 (two) times daily with a meal.   isosorbide mononitrate 30 MG 24 hr tablet Commonly known as: IMDUR Take 1 tablet (30  mg total) by mouth 2 (two) times daily.   lisinopril 10 MG tablet Commonly known as: ZESTRIL Take 1 tablet (10 mg total) by mouth daily.   metoprolol succinate 25 MG 24 hr tablet Commonly known as: Toprol XL Take 0.5 tablets (12.5 mg total) by mouth daily.   mometasone 50 MCG/ACT nasal spray Commonly known as: NASONEX Place 2 sprays into the nose daily as needed (for nasal congestion).   nitroGLYCERIN 0.4 MG SL tablet Commonly known as: NITROSTAT Place 1 tablet (0.4 mg total) under the tongue every 5 (five) minutes as needed for chest pain.   pantoprazole 40 MG tablet Commonly known as: PROTONIX Take 40 mg by mouth at bedtime.   rosuvastatin 10 MG tablet Commonly known as: CRESTOR Take 10 mg by mouth  daily.   Synjardy XR 08-998 MG Tb24 Generic drug: Empagliflozin-metFORMIN HCl ER Take 1 tablet by mouth daily.   triamterene-hydrochlorothiazide 37.5-25 MG capsule Commonly known as: DYAZIDE Take 1 capsule by mouth every morning.       Allergies:  Allergies  Allergen Reactions  . Ambien [Zolpidem Tartrate] Other (See Comments)    Caused memory problems  . Other     Medication for insomnia  . Simvastatin     REACTION: arm numbness Patient currently on this medication (03/2012).    Family History: Family History  Problem Relation Age of Onset  . Lymphoma Mother   . Cancer Mother   . Ulcers Father   . Cancer Father   . Ulcers Paternal Grandfather        Stomach ulcer, resection  . Heart attack Other   . Diabetes Other     Social History:  reports that he has never smoked. He has never used smokeless tobacco. He reports current alcohol use of about 2.0 standard drinks of alcohol per week. He reports that he does not use drugs.  ROS: All other review of systems were reviewed and are negative except what is noted above in HPI  Physical Exam: BP 125/60   Pulse (!) 54   Temp (!) 96.8 F (36 C)   Ht 5\' 9"  (1.753 m)   Wt 205 lb (93 kg)   BMI 30.27 kg/m   Constitutional:  Alert and oriented, No acute distress. HEENT: [place] AT, moist mucus membranes.  Trachea midline, no masses. Cardiovascular: No clubbing, cyanosis, or edema. Respiratory: Normal respiratory effort, no increased work of breathing. GI: Abdomen is soft, nontender, nondistended, no abdominal masses GU: No CVA tenderness. Circumcised phallus. No masses/lesions on penis, testis, scrotum. Prostate 40g smooth no nodules no induration.  Lymph: No cervical or inguinal lymphadenopathy. Skin: No rashes, bruises or suspicious lesions. Neurologic: Grossly intact, no focal deficits, moving all 4 extremities. Psychiatric: Normal mood and affect.  Laboratory Data: Lab Results  Component Value Date   WBC 10.1  03/02/2016   HGB 10.5 (L) 03/02/2016   HCT 32.6 (L) 03/02/2016   MCV 82.7 03/02/2016   PLT 178 03/02/2016    Lab Results  Component Value Date   CREATININE 0.87 03/02/2016    Lab Results  Component Value Date   PSA 3.42 06/20/2011   PSA 2.79 01/16/2009    Lab Results  Component Value Date   TESTOSTERONE 411.33 01/16/2009    Lab Results  Component Value Date   HGBA1C 6.4 (H) 02/24/2016    Urinalysis    Component Value Date/Time   COLORURINE YELLOW 02/24/2016 1529   APPEARANCEUR CLEAR 02/24/2016 1529   LABSPEC 1.016 02/24/2016 1529  PHURINE 7.0 02/24/2016 1529   GLUCOSEU 100 (A) 02/24/2016 1529   HGBUR NEGATIVE 02/24/2016 1529   HGBUR negative 01/15/2009 1046   BILIRUBINUR neg 08/20/2019 1028   KETONESUR NEGATIVE 02/24/2016 1529   PROTEINUR Negative 08/20/2019 1028   PROTEINUR NEGATIVE 02/24/2016 1529   UROBILINOGEN negative (A) 08/20/2019 1028   UROBILINOGEN 1.0 01/15/2009 1046   NITRITE neg 08/20/2019 1028   NITRITE NEGATIVE 02/24/2016 1529   LEUKOCYTESUR Negative 08/20/2019 1028    No results found for: LABMICR, [place], RBCUA, LABEPIT, MUCUS, BACTERIA  Pertinent Imaging: CT abd/pelvis 07/09/2019: Images reviewed and discussed with patient No results found for this or any previous visit. No results found for this or any previous visit. No results found for this or any previous visit. No results found for this or any previous visit. Results for orders placed during the hospital encounter of 06/30/08  US Renal   Narrative Clinical Data: Renal cyst on abdominal CT   RENAL/URINARY TRACT ULTRASOUND   Technique:  Complete ultrasound examination of the urinary tract was performed including evaluation of the kidneys, renal collecting systems, and urinary bladder.   Comparison: None Correlation:  CT abdomen 01/22/2008   Findings: Kidneys measure 12.5 cm length right and 11.2 cm length left. Normal renal cortical thickness and echogenicity bilaterally.  Bilobed cyst identified at upper pole left kidney, 9.8 x 6.5 x 8.9 cm. No discrete mural nodularity, septation, or solid mass. Nonshadowing echogenic foci at inferior pole and mid right kidney. No definite shadowing renal calcifications. No perinephric fluid collections. Bladder unremarkable.   IMPRESSION: Bilobed cyst at upper pole left kidney 9.8 x 6.5 x 8.9 cm, without evidence of solid mass, mural nodularity or septation. Nonspecific tiny echogenic foci in parenchyma of right kidney, of uncertain etiology and significance.  Provider: Mertie Clause   No results found for this or any previous visit. No results found for this or any previous visit. No results found for this or any previous visit.  Assessment & Plan:    1. Renal cyst -We discussed the natural hx of BOsniak renal cysts. Since he is not symptomatic from the cyst with will observe. RTC 1 year with renal US - POCT urinalysis dipstick - BLADDER SCAN AMB NON-IMAGING  2. Benign prostatic hyperplasia with urinary obstruction -We will start uroxatral 10mg  qhs. If this fails to improve his LUTS the patient will need to contact Dr. Durene Cal office to discuss alternative to jardiance as this is likely exacerbating his LUTS  3. Incomplete emptying of bladder -uroxatral 10mg  qhs   No follow-ups on file.  Nicolette Bang, MD  [facility] [place]

## 2019-08-20 NOTE — Patient Instructions (Signed)

## 2019-08-21 ENCOUNTER — Other Ambulatory Visit: Payer: Self-pay | Admitting: Cardiology

## 2019-08-22 DIAGNOSIS — Z7984 Long term (current) use of oral hypoglycemic drugs: Secondary | ICD-10-CM | POA: Diagnosis not present

## 2019-08-22 DIAGNOSIS — H524 Presbyopia: Secondary | ICD-10-CM | POA: Diagnosis not present

## 2019-08-22 DIAGNOSIS — H11441 Conjunctival cysts, right eye: Secondary | ICD-10-CM | POA: Diagnosis not present

## 2019-08-22 DIAGNOSIS — H0012 Chalazion right lower eyelid: Secondary | ICD-10-CM | POA: Diagnosis not present

## 2019-08-25 DIAGNOSIS — G4733 Obstructive sleep apnea (adult) (pediatric): Secondary | ICD-10-CM | POA: Diagnosis not present

## 2019-08-25 DIAGNOSIS — I10 Essential (primary) hypertension: Secondary | ICD-10-CM | POA: Diagnosis not present

## 2019-08-25 DIAGNOSIS — Z0189 Encounter for other specified special examinations: Secondary | ICD-10-CM | POA: Diagnosis not present

## 2019-09-23 DIAGNOSIS — H0015 Chalazion left lower eyelid: Secondary | ICD-10-CM | POA: Diagnosis not present

## 2019-09-23 DIAGNOSIS — H0012 Chalazion right lower eyelid: Secondary | ICD-10-CM | POA: Diagnosis not present

## 2019-09-23 DIAGNOSIS — D3101 Benign neoplasm of right conjunctiva: Secondary | ICD-10-CM | POA: Diagnosis not present

## 2019-09-24 DIAGNOSIS — I10 Essential (primary) hypertension: Secondary | ICD-10-CM | POA: Diagnosis not present

## 2019-09-24 DIAGNOSIS — E1165 Type 2 diabetes mellitus with hyperglycemia: Secondary | ICD-10-CM | POA: Diagnosis not present

## 2019-09-24 DIAGNOSIS — E1122 Type 2 diabetes mellitus with diabetic chronic kidney disease: Secondary | ICD-10-CM | POA: Diagnosis not present

## 2019-09-24 DIAGNOSIS — D509 Iron deficiency anemia, unspecified: Secondary | ICD-10-CM | POA: Diagnosis not present

## 2019-09-24 DIAGNOSIS — E782 Mixed hyperlipidemia: Secondary | ICD-10-CM | POA: Diagnosis not present

## 2019-09-24 DIAGNOSIS — D649 Anemia, unspecified: Secondary | ICD-10-CM | POA: Diagnosis not present

## 2019-09-24 DIAGNOSIS — E7849 Other hyperlipidemia: Secondary | ICD-10-CM | POA: Diagnosis not present

## 2019-09-24 DIAGNOSIS — E785 Hyperlipidemia, unspecified: Secondary | ICD-10-CM | POA: Diagnosis not present

## 2019-09-24 DIAGNOSIS — E119 Type 2 diabetes mellitus without complications: Secondary | ICD-10-CM | POA: Diagnosis not present

## 2019-09-24 DIAGNOSIS — G9009 Other idiopathic peripheral autonomic neuropathy: Secondary | ICD-10-CM | POA: Diagnosis not present

## 2019-09-26 ENCOUNTER — Other Ambulatory Visit: Payer: Self-pay

## 2019-09-26 ENCOUNTER — Encounter: Payer: Self-pay | Admitting: Cardiology

## 2019-09-26 ENCOUNTER — Encounter: Payer: Self-pay | Admitting: *Deleted

## 2019-09-26 ENCOUNTER — Ambulatory Visit (INDEPENDENT_AMBULATORY_CARE_PROVIDER_SITE_OTHER): Payer: Medicare HMO | Admitting: Cardiology

## 2019-09-26 VITALS — BP 124/80 | HR 60 | Ht 69.0 in | Wt 210.2 lb

## 2019-09-26 DIAGNOSIS — I10 Essential (primary) hypertension: Secondary | ICD-10-CM

## 2019-09-26 DIAGNOSIS — I2510 Atherosclerotic heart disease of native coronary artery without angina pectoris: Secondary | ICD-10-CM

## 2019-09-26 DIAGNOSIS — R079 Chest pain, unspecified: Secondary | ICD-10-CM

## 2019-09-26 DIAGNOSIS — E782 Mixed hyperlipidemia: Secondary | ICD-10-CM | POA: Diagnosis not present

## 2019-09-26 NOTE — Progress Notes (Signed)
Clinical Summary [name] is a 69 y.o.male seen today for follow up of the following medical problems.  1.CAD - history of LIMA-LAD and SVG-OM2 CABG 01/2016. LVEF 50-55% by TEE at the time - 03/2017 echo LVEF 45-50%. Grade Ii diastolic dysfunction.  08/2017 nuclear stress: PVCs, short runs NSVT. Small mild intensity mid to basal inferolatearl defect fixed most consistent with scar. LVEF 44% 10/2017 echo LVEF 50-55%, no WMAs   - metoprolol dosing limited to fatigue and bradycardia, has only been on 12.5mg  daily. Feels    - shooting like pain epigastric midchest typically at night. Lasts about 30 minutes. Can be better with position changes. Mild SOB at times. Reports PPI compliance, had been an issue berfore - no exertional symptoms.   - chronic chest pain symptoms fairly stable from prior visits - some exertional symptoms which are somewhat new. Some DOE with activities with high levels of activity.    2. Irregular heart beat -PVCs noted on stress test - higher beta blocker dose causes fatigue, feels good on toprol 12.5mg  daily  - no significnat recent symptoms.   3. HTN - compliant with meds  4. Hyperlipidemia - lipitor caused muscle aches. Simvastatin caused side effects. Currently on crestor 10mg  daily, tolerating.  - remains compliant with statin    5. LE edema - controlled with prn lasix    Past Medical History:  Diagnosis Date  . Acid reflux   . Adenomatous colon polyp 08/2010  . Anxiety   . Arthritis   . Chronic back pain   . Coronary artery disease   . Diabetes mellitus without complication ([place])   . GERD (gastroesophageal reflux disease)   . Gout   . Hiatal hernia   . High cholesterol   . HTN (hypertension)   . Hyperlipidemia   . Renal cyst    Large Left  . Restless legs   . Sleep apnea    uses cpap     Allergies  Allergen Reactions  . Ambien [Zolpidem Tartrate] Other (See Comments)    Caused memory problems  . Other       Medication for insomnia  . Simvastatin     REACTION: arm numbness Patient currently on this medication (03/2012).     Current Outpatient Medications  Medication Sig Dispense Refill  . alfuzosin (UROXATRAL) 10 MG 24 hr tablet Take 1 tablet (10 mg total) by mouth daily with breakfast. 30 tablet 11  . amLODipine (NORVASC) 10 MG tablet Take 1 tablet (10 mg total) daily by mouth. 180 tablet 3  . aspirin EC 81 MG tablet Take 81 mg daily by mouth.    . furosemide (LASIX) 40 MG tablet TAKE 1.5 TABLETS (60 MG TOTAL) BY MOUTH AS NEEDED. 135 tablet 1  . gabapentin (NEURONTIN) 300 MG capsule Take 300 mg by mouth at bedtime.    . isosorbide mononitrate (IMDUR) 30 MG 24 hr tablet Take 1 tablet (30 mg total) by mouth 2 (two) times daily. 180 tablet 1  . lisinopril (ZESTRIL) 10 MG tablet Take 1 tablet (10 mg total) by mouth daily. 90 tablet 3  . metFORMIN (GLUCOPHAGE) 500 MG tablet Take 500 mg by mouth 2 (two) times daily with a meal.    . metoprolol succinate (TOPROL-XL) 25 MG 24 hr tablet TAKE 1/2 TABLET BY MOUTH EVERY DAY 45 tablet 1  . mometasone (NASONEX) 50 MCG/ACT nasal spray Place 2 sprays into the nose daily as needed (for nasal congestion).    . nitroGLYCERIN (  NITROSTAT) 0.4 MG SL tablet Place 1 tablet (0.4 mg total) under the tongue every 5 (five) minutes as needed for chest pain. 25 tablet 3  . pantoprazole (PROTONIX) 40 MG tablet Take 40 mg by mouth at bedtime.     . rosuvastatin (CRESTOR) 10 MG tablet Take 10 mg by mouth daily.    Marland Kitchen SYNJARDY XR 08-998 MG TB24 Take 1 tablet by mouth daily.    Marland Kitchen triamterene-hydrochlorothiazide (DYAZIDE) 37.5-25 MG capsule Take 1 capsule by mouth every morning.      No current facility-administered medications for this visit.     Past Surgical History:  Procedure Laterality Date  . APPENDECTOMY  1967  . CARDIAC CATHETERIZATION N/A 01/31/2016   Procedure: Left Heart Cath and Coronary Angiography;  Surgeon: Belva Crome, MD;  Location: [facility];  Service: Cardiovascular;  Laterality: N/A;  . CARDIOVASCULAR STRESS TEST  2004   Treadmill Stress Test  . CARDIOVASCULAR STRESS TEST  2010  . CHOLECYSTECTOMY  07/2006  . COLONOSCOPY  08/2010   RMR: Cecal tubular adenoma removed, otherwise normal exam. next tcs 08/2015  . CORONARY ARTERY BYPASS GRAFT N/A 02/28/2016   Procedure: CORONARY ARTERY BYPASS GRAFTING x two using left internal mammary artery and right leg greater saphenous vein harvested endoscopically;  Surgeon: Gaye Pollack, MD;  Location: [facility];  Service: Open Heart Surgery;  Laterality: N/A;  . [place]  . TEE WITHOUT CARDIOVERSION N/A 02/28/2016   Procedure: TRANSESOPHAGEAL ECHOCARDIOGRAM (TEE);  Surgeon: Gaye Pollack, MD;  Location: [facility];  Service: Open Heart Surgery;  Laterality: N/A;  . UPPER GASTROINTESTINAL ENDOSCOPY  08/2010   RMR: GERD benign biopsy  . VASECTOMY       Allergies  Allergen Reactions  . Ambien [Zolpidem Tartrate] Other (See Comments)    Caused memory problems  . Other     Medication for insomnia  . Simvastatin     REACTION: arm numbness Patient currently on this medication (03/2012).      Family History  Problem Relation Age of Onset  . Lymphoma Mother   . Cancer Mother   . Ulcers Father   . Cancer Father   . Ulcers Paternal Grandfather        Stomach ulcer, resection  . Heart attack Other   . Diabetes Other      Social History [name] reports that he has never smoked. He has never used smokeless tobacco. [name] reports current alcohol use of about 2.0 standard drinks of alcohol per week.   Review of Systems CONSTITUTIONAL: No weight loss, fever, chills, weakness or fatigue.  HEENT: Eyes: No visual loss, blurred vision, double vision or yellow sclerae.No hearing loss, sneezing, congestion, runny nose or sore throat.  SKIN: No rash or itching.  CARDIOVASCULAR: per hpi RESPIRATORY: per hpi GASTROINTESTINAL: No anorexia, nausea, vomiting or diarrhea.  No abdominal pain or blood.  GENITOURINARY: No burning on urination, no polyuria NEUROLOGICAL: No headache, dizziness, syncope, paralysis, ataxia, numbness or tingling in the extremities. No change in bowel or bladder control.  MUSCULOSKELETAL: No muscle, back pain, joint pain or stiffness.  LYMPHATICS: No enlarged nodes. No history of splenectomy.  PSYCHIATRIC: No history of depression or anxiety.  ENDOCRINOLOGIC: No reports of sweating, cold or heat intolerance. No polyuria or polydipsia.  Marland Kitchen   Physical Examination Today's Vitals   09/26/19 1247  BP: 124/80  Pulse: 60  SpO2: 96%  Weight: 210 lb 3.2 oz (95.3 kg)  Height: 5\' 9"  (1.753 m)   Body mass index is 31.04 kg/m.  Gen: resting comfortably, no acute distress HEENT: no scleral icterus, pupils equal round and reactive, no palptable cervical adenopathy,  CV: RRR, no m/r/g, no jvd Resp: Clear to auscultation bilaterally GI: abdomen is soft, non-tender, non-distended, normal bowel sounds, no hepatosplenomegaly MSK: extremities are warm, no edema.  Skin: warm, no rash Neuro:  no focal deficits Psych: appropriate affect   Diagnostic Studies 01/2014 Echo Study Conclusions  - Left ventricle: The cavity size was normal. Wall thickness was increased in a pattern of mild LVH. Systolic function was normal. The estimated ejection fraction was in the range of 55% to 60%. Wall motion was normal; there were no regional wall motion abnormalities. Doppler parameters are consistent with abnormal left ventricular relaxation (grade 1 diastolic dysfunction). Doppler parameters are consistent with elevated mean left atrial filling pressure. Medial E/e&' 12.4. - Left atrium: The atrium was moderately dilated. - Right atrium: The atrium was mildly dilated. - Pulmonic valve: There was mild regurgitation.     01/2014 Lexiscan MPI IMPRESSION:  1. Abnormal study. Perfusion imaging suggests mild region of  ischemia in the mid  apical inferolateral wall. The baseline GXT was  however low risk with Duke treadmill score of 8.5, and hypertensive  response.  2. No focal LV wall motion abnormalities by gated imaging.  3. Left ventricular ejection fraction 44% although appears better  visually.  4. Overall low-risk stress test findings*.   07/2015 Exercise MPI  No diagnostic ST segment changes. Occasional PVCs noted during exercise, more frequent in recovery. Nonlimiting chest pain reported with hypertensive response. Intermediate risk Duke treadmill score of 3.5.  Small, moderate intensity, mid to apical inferolateral defect that is reversible consistent with ischemia.  This is an intermediate risk study.  Nuclear stress EF: 37%, appears higher visually. Suggest echocardiogram to further assess.  01/2016 cath  Ost Cx lesion, 75 %stenosed.  LM lesion, 50 %stenosed.  The left ventricular systolic function is normal.  The left ventricular ejection fraction is 50-55% by visual estimate.  LV end diastolic pressure is mildly elevated.   Eccentric 70-80% proximal/ostial circumflex. Circumflex territory is large.  50% stenosis in the distal left main.  Codominant right coronary  Overall normal LV function with mild elevation and end-diastolic pressure.   03/2017 echo Study Conclusions  - Left ventricle: The cavity size was normal. Wall thickness was increased in a pattern of mild LVH. Systolic function was mildly reduced. The estimated ejection fraction was in the range of 45% to 50%. Diffuse hypokinesis. Features are consistent with a pseudonormal left ventricular filling pattern, with concomitant abnormal relaxation and increased filling pressure (grade 2 diastolic dysfunction). Doppler parameters are consistent with high ventricular filling pressure. - Left atrium: The atrium was mildly dilated. - Pulmonary arteries: PA peak pressure: 31 mm Hg (S).  Impressions:  -  Left ventricular systolic function appeared better in some views and worse on others. Overall, LVEF appears low normal to mildly reduced, 45-50%.  01/2016 PFTs Overinflation without specific obstruction  08/2017 nuclear stress  No clearly diagnostic ST segment changes to indicate ischemia. Hypertensive response noted. No chest pain was reported. Occasional frequent PVCs were noted during exercise including 3 beat burst of NSVT near peak after injection of radiotracer. There were no sustained events. Overall low risk Duke treadmill score of 8.5.  Blood pressure demonstrated a hypertensive response to exercise.  Small, mild intensity, mid to basal inferolateral defect that is fixed and  most consistent with scar versus attenuation artifact.  This is an intermediate risk study.  Nuclear stress EF: 44%.  10/2017 echo Study Conclusions  - Left ventricle: The cavity size was normal. Wall thickness was normal. Systolic function was normal. The estimated ejection fraction was in the range of 50% to 55%. Wall motion was normal; there were no regional wall motion abnormalities. Indeterminate diastolic function. - Aortic valve: Moderately calcified annulus. Trileaflet. - Aorta: Mild aortic root enlargement. - Mitral valve: There was mild regurgitation.   - 01/2016 PFTsshowed evidence of air trapping without specific obstruction.     Assessment and Plan  1.CAD -has had varying atypical chest pain over the last several years.Some increase in symptoms recently with some DOE - plan for lexiscan to reevalute    2. HTN -he is at goal, continue current meds  3. Hyperlipidemia -continue statin  F/u pending stress results    Arnoldo Lenis, M.D.

## 2019-09-26 NOTE — Patient Instructions (Addendum)
Your physician recommends that you schedule a follow-up appointment PENDING TEST RESULTS WITH DR BRANCH  Your physician recommends that you continue on your current medications as directed. Please refer to the Current Medication list given to you today.  Your physician has requested that you have a lexiscan myoview. For further information please visit [email or web address]. Please follow instruction sheet, as given.  Thank you for choosing [facility]!!   

## 2019-10-01 ENCOUNTER — Other Ambulatory Visit: Payer: Self-pay

## 2019-10-01 ENCOUNTER — Ambulatory Visit (INDEPENDENT_AMBULATORY_CARE_PROVIDER_SITE_OTHER): Payer: Medicare HMO | Admitting: Urology

## 2019-10-01 ENCOUNTER — Encounter: Payer: Self-pay | Admitting: Urology

## 2019-10-01 VITALS — BP 132/67 | HR 59 | Temp 97.9°F | Ht 69.0 in | Wt 210.0 lb

## 2019-10-01 DIAGNOSIS — E782 Mixed hyperlipidemia: Secondary | ICD-10-CM | POA: Diagnosis not present

## 2019-10-01 DIAGNOSIS — I2510 Atherosclerotic heart disease of native coronary artery without angina pectoris: Secondary | ICD-10-CM | POA: Diagnosis not present

## 2019-10-01 DIAGNOSIS — R339 Retention of urine, unspecified: Secondary | ICD-10-CM

## 2019-10-01 DIAGNOSIS — G9009 Other idiopathic peripheral autonomic neuropathy: Secondary | ICD-10-CM | POA: Diagnosis not present

## 2019-10-01 DIAGNOSIS — K219 Gastro-esophageal reflux disease without esophagitis: Secondary | ICD-10-CM | POA: Diagnosis not present

## 2019-10-01 DIAGNOSIS — N401 Enlarged prostate with lower urinary tract symptoms: Secondary | ICD-10-CM

## 2019-10-01 DIAGNOSIS — N138 Other obstructive and reflux uropathy: Secondary | ICD-10-CM

## 2019-10-01 DIAGNOSIS — I10 Essential (primary) hypertension: Secondary | ICD-10-CM | POA: Diagnosis not present

## 2019-10-01 DIAGNOSIS — Z0001 Encounter for general adult medical examination with abnormal findings: Secondary | ICD-10-CM | POA: Diagnosis not present

## 2019-10-01 DIAGNOSIS — R1030 Lower abdominal pain, unspecified: Secondary | ICD-10-CM | POA: Diagnosis not present

## 2019-10-01 DIAGNOSIS — E1122 Type 2 diabetes mellitus with diabetic chronic kidney disease: Secondary | ICD-10-CM | POA: Diagnosis not present

## 2019-10-01 DIAGNOSIS — E1165 Type 2 diabetes mellitus with hyperglycemia: Secondary | ICD-10-CM | POA: Diagnosis not present

## 2019-10-01 LAB — BLADDER SCAN AMB NON-IMAGING: Scan Result: 33

## 2019-10-01 MED ORDER — CLOTRIMAZOLE-BETAMETHASONE 1-0.05 % EX CREA
1.0000 | TOPICAL_CREAM | Freq: Two times a day (BID) | CUTANEOUS | 3 refills | Status: DC
Start: 2019-10-01 — End: 2020-10-01

## 2019-10-01 NOTE — Patient Instructions (Signed)

## 2019-10-01 NOTE — Progress Notes (Addendum)
10/01/2019 3:40 PM   Thomas [name] 1951-04-09 [id number]  Referring provider: Celene Squibb, MD [place],  [place] [id number]  Incomplete emptying  HPI: [name] is a 69yo here for followup for BPH and incomplete emptying. We started uroxatral last visit and he started taking jardiance in Am and his LUTS have significantly improved. Urinary frequency improved. Nocturia 1x. Stream good.    PMH: Past Medical History:  Diagnosis Date  . Acid reflux   . Adenomatous colon polyp 08/2010  . Anxiety   . Arthritis   . Chronic back pain   . Coronary artery disease   . Diabetes mellitus without complication ([place])   . GERD (gastroesophageal reflux disease)   . Gout   . Hiatal hernia   . High cholesterol   . HTN (hypertension)   . Hyperlipidemia   . Renal cyst    Large Left  . Restless legs   . Sleep apnea    uses cpap    Surgical History: Past Surgical History:  Procedure Laterality Date  . APPENDECTOMY  1967  . CARDIAC CATHETERIZATION N/A 01/31/2016   Procedure: Left Heart Cath and Coronary Angiography;  Surgeon: Belva Crome, MD;  Location: [facility];  Service: Cardiovascular;  Laterality: N/A;  . CARDIOVASCULAR STRESS TEST  2004   Treadmill Stress Test  . CARDIOVASCULAR STRESS TEST  2010  . CHOLECYSTECTOMY  07/2006  . COLONOSCOPY  08/2010   RMR: Cecal tubular adenoma removed, otherwise normal exam. next tcs 08/2015  . CORONARY ARTERY BYPASS GRAFT N/A 02/28/2016   Procedure: CORONARY ARTERY BYPASS GRAFTING x two using left internal mammary artery and right leg greater saphenous vein harvested endoscopically;  Surgeon: Gaye Pollack, MD;  Location: [place] [facility];  Service: Open Heart Surgery;  Laterality: N/A;  . [place]  . TEE WITHOUT CARDIOVERSION N/A 02/28/2016   Procedure: TRANSESOPHAGEAL ECHOCARDIOGRAM (TEE);  Surgeon: Gaye Pollack, MD;  Location: [facility];  Service: Open Heart Surgery;  Laterality: N/A;  . UPPER GASTROINTESTINAL  ENDOSCOPY  08/2010   RMR: GERD benign biopsy  . VASECTOMY      Home Medications:  Allergies as of 10/01/2019      Reactions   Ambien [zolpidem Tartrate] Other (See Comments)   Caused memory problems   Other    Medication for insomnia   Simvastatin    REACTION: arm numbness Patient currently on this medication (03/2012).      Medication List       Accurate as of October 01, 2019  3:40 PM. If you have any questions, ask your nurse or doctor.        alfuzosin 10 MG 24 hr tablet Commonly known as: UROXATRAL Take 1 tablet (10 mg total) by mouth daily with breakfast.   amLODipine 10 MG tablet Commonly known as: NORVASC Take 1 tablet (10 mg total) daily by mouth.   aspirin EC 81 MG tablet Take 81 mg daily by mouth.   furosemide 40 MG tablet Commonly known as: LASIX TAKE 1.5 TABLETS (60 MG TOTAL) BY MOUTH AS NEEDED.   gabapentin 300 MG capsule Commonly known as: NEURONTIN Take 300 mg by mouth at bedtime.   isosorbide mononitrate 30 MG 24 hr tablet Commonly known as: IMDUR Take 1 tablet (30 mg total) by mouth 2 (two) times daily.   lisinopril 10 MG tablet Commonly known as: ZESTRIL Take 1 tablet (10 mg total) by mouth daily.   metoprolol  succinate 25 MG 24 hr tablet Commonly known as: TOPROL-XL TAKE 1/2 TABLET BY MOUTH EVERY DAY   mometasone 50 MCG/ACT nasal spray Commonly known as: NASONEX Place 2 sprays into the nose daily as needed (for nasal congestion).   nitroGLYCERIN 0.4 MG SL tablet Commonly known as: NITROSTAT Place 1 tablet (0.4 mg total) under the tongue every 5 (five) minutes as needed for chest pain.   pantoprazole 40 MG tablet Commonly known as: PROTONIX Take 40 mg by mouth at bedtime.   rosuvastatin 10 MG tablet Commonly known as: CRESTOR Take 10 mg by mouth daily.   Synjardy XR 08-998 MG Tb24 Generic drug: Empagliflozin-metFORMIN HCl ER Take 1 tablet by mouth daily.   triamterene-hydrochlorothiazide 37.5-25 MG capsule Commonly known as:  DYAZIDE Take 1 capsule by mouth every morning.       Allergies:  Allergies  Allergen Reactions  . Ambien [Zolpidem Tartrate] Other (See Comments)    Caused memory problems  . Other     Medication for insomnia  . Simvastatin     REACTION: arm numbness Patient currently on this medication (03/2012).    Family History: Family History  Problem Relation Age of Onset  . Lymphoma Mother   . Cancer Mother   . Ulcers Father   . Cancer Father   . Ulcers Paternal Grandfather        Stomach ulcer, resection  . Heart attack Other   . Diabetes Other     Social History:  reports that he has never smoked. He has never used smokeless tobacco. He reports current alcohol use of about 2.0 standard drinks of alcohol per week. He reports that he does not use drugs.  ROS: All other review of systems were reviewed and are negative except what is noted above in HPI  Physical Exam: BP 132/67   Pulse (!) 59   Temp 97.9 F (36.6 C)   Ht 5\' 9"  (1.753 m)   Wt 210 lb (95.3 kg)   BMI 31.01 kg/m   Constitutional:  Alert and oriented, No acute distress. HEENT: [place] AT, moist mucus membranes.  Trachea midline, no masses. Cardiovascular: No clubbing, cyanosis, or edema. Respiratory: Normal respiratory effort, no increased work of breathing. GI: Abdomen is soft, nontender, nondistended, no abdominal masses GU: No CVA tenderness.  Lymph: No cervical or inguinal lymphadenopathy. Skin: No rashes, bruises or suspicious lesions. Neurologic: Grossly intact, no focal deficits, moving all 4 extremities. Psychiatric: Normal mood and affect.  Laboratory Data: Lab Results  Component Value Date   WBC 10.1 03/02/2016   HGB 10.5 (L) 03/02/2016   HCT 32.6 (L) 03/02/2016   MCV 82.7 03/02/2016   PLT 178 03/02/2016    Lab Results  Component Value Date   CREATININE 0.87 03/02/2016    Lab Results  Component Value Date   PSA 3.42 06/20/2011   PSA 2.79 01/16/2009    Lab Results  Component Value Date    TESTOSTERONE 411.33 01/16/2009    Lab Results  Component Value Date   HGBA1C 6.4 (H) 02/24/2016    Urinalysis    Component Value Date/Time   COLORURINE YELLOW 02/24/2016 1529   APPEARANCEUR CLEAR 02/24/2016 1529   LABSPEC 1.016 02/24/2016 1529   PHURINE 7.0 02/24/2016 1529   GLUCOSEU 100 (A) 02/24/2016 1529   HGBUR NEGATIVE 02/24/2016 1529   HGBUR negative 01/15/2009 1046   BILIRUBINUR neg 08/20/2019 1028   KETONESUR NEGATIVE 02/24/2016 1529   PROTEINUR Negative 08/20/2019 1028   PROTEINUR NEGATIVE 02/24/2016 1529  UROBILINOGEN negative (A) 08/20/2019 1028   UROBILINOGEN 1.0 01/15/2009 1046   NITRITE neg 08/20/2019 1028   NITRITE NEGATIVE 02/24/2016 1529   LEUKOCYTESUR Negative 08/20/2019 1028    No results found for: LABMICR, WBCUA, RBCUA, LABEPIT, MUCUS, BACTERIA  Pertinent Imaging:  No results found for this or any previous visit. No results found for this or any previous visit. No results found for this or any previous visit. No results found for this or any previous visit. Results for orders placed during the hospital encounter of 06/30/08  US Renal   Narrative Clinical Data: Renal cyst on abdominal CT   RENAL/URINARY TRACT ULTRASOUND   Technique:  Complete ultrasound examination of the urinary tract was performed including evaluation of the kidneys, renal collecting systems, and urinary bladder.   Comparison: None Correlation:  CT abdomen 01/22/2008   Findings: Kidneys measure 12.5 cm length right and 11.2 cm length left. Normal renal cortical thickness and echogenicity bilaterally. Bilobed cyst identified at upper pole left kidney, 9.8 x 6.5 x 8.9 cm. No discrete mural nodularity, septation, or solid mass. Nonshadowing echogenic foci at inferior pole and mid right kidney. No definite shadowing renal calcifications. No perinephric fluid collections. Bladder unremarkable.   IMPRESSION: Bilobed cyst at upper pole left kidney 9.8 x 6.5 x 8.9 cm,  without evidence of solid mass, mural nodularity or septation. Nonspecific tiny echogenic foci in parenchyma of right kidney, of uncertain etiology and significance.  Provider: Mertie Clause   No results found for this or any previous visit. No results found for this or any previous visit. No results found for this or any previous visit.  Assessment & Plan:    1. Incomplete emptying of bladder -continue uroxatral - BLADDER SCAN AMB NON-IMAGING  2. Benign prostatic hyperplasia with urinary obstruction -continue uroxatral   Return in about 6 months (around 04/01/2020).  Nicolette Bang, MD  [facility] [place]

## 2019-10-01 NOTE — Progress Notes (Signed)
Urological Symptom Review  Patient is experiencing the following symptoms: Frequent urination Get up at night to urinate Painful intercourse   Review of Systems  Gastrointestinal (upper)  : Negative for upper GI symptoms  Gastrointestinal (lower) : Negative for lower GI symptoms  Constitutional : Negative for symptoms  Skin: Negative for skin symptoms  Eyes: Negative for eye symptoms  Ear/Nose/Throat : Negative for Ear/Nose/Throat symptoms  Hematologic/Lymphatic: Negative for Hematologic/Lymphatic symptoms  Cardiovascular : Negative for cardiovascular symptoms  Respiratory : Negative for respiratory symptoms  Endocrine: Excessive thirst  Musculoskeletal: Back pain  Neurological: Negative for neurological symptoms  Psychologic: Negative for psychiatric symptoms

## 2019-10-11 ENCOUNTER — Other Ambulatory Visit: Payer: Self-pay | Admitting: Cardiology

## 2019-10-15 DIAGNOSIS — G4733 Obstructive sleep apnea (adult) (pediatric): Secondary | ICD-10-CM | POA: Diagnosis not present

## 2019-10-17 ENCOUNTER — Encounter (HOSPITAL_COMMUNITY): Admission: RE | Admit: 2019-10-17 | Payer: Medicare HMO | Source: Ambulatory Visit

## 2019-10-17 ENCOUNTER — Encounter (HOSPITAL_COMMUNITY)
Admission: RE | Admit: 2019-10-17 | Discharge: 2019-10-17 | Disposition: A | Discharge status: Home / Self Care | Payer: Medicare HMO | Source: Ambulatory Visit | Attending: Cardiology | Admitting: Cardiology

## 2019-10-17 ENCOUNTER — Other Ambulatory Visit: Payer: Self-pay

## 2019-10-17 DIAGNOSIS — R079 Chest pain, unspecified: Secondary | ICD-10-CM

## 2019-10-30 ENCOUNTER — Other Ambulatory Visit: Payer: Self-pay

## 2019-10-30 ENCOUNTER — Encounter (HOSPITAL_COMMUNITY)
Admission: RE | Admit: 2019-10-30 | Discharge: 2019-10-30 | Disposition: A | Discharge status: Home / Self Care | Payer: Medicare HMO | Source: Ambulatory Visit | Attending: Cardiology | Admitting: Cardiology

## 2019-10-30 ENCOUNTER — Encounter (HOSPITAL_BASED_OUTPATIENT_CLINIC_OR_DEPARTMENT_OTHER)
Admission: RE | Admit: 2019-10-30 | Discharge: 2019-10-30 | Disposition: A | Discharge status: Home / Self Care | Payer: Medicare HMO | Source: Ambulatory Visit | Attending: Cardiology | Admitting: Cardiology

## 2019-10-30 DIAGNOSIS — R079 Chest pain, unspecified: Secondary | ICD-10-CM | POA: Diagnosis not present

## 2019-10-30 LAB — NM MYOCAR MULTI W/SPECT W/WALL MOTION / EF
LV dias vol: 159 mL (ref 62–150)
LV sys vol: 90 mL
Peak HR: 65 {beats}/min
RATE: 0.44
Rest HR: 55 {beats}/min
SDS: 1
SRS: 3
SSS: 3
TID: 1.17

## 2019-10-30 MED ORDER — TECHNETIUM TC 99M TETROFOSMIN IV KIT
10.0000 | PACK | Freq: Once | INTRAVENOUS | Status: AC | PRN
  Administered 2019-10-30: 11 via INTRAVENOUS

## 2019-10-30 MED ORDER — REGADENOSON 0.4 MG/5ML IV SOLN
INTRAVENOUS | Status: AC
  Administered 2019-10-30: 0.4 mg via INTRAVENOUS
  Filled 2019-10-30: qty 5

## 2019-10-30 MED ORDER — SODIUM CHLORIDE FLUSH 0.9 % IV SOLN
INTRAVENOUS | Status: AC
  Administered 2019-10-30: 10 mL via INTRAVENOUS
  Filled 2019-10-30: qty 10

## 2019-10-30 MED ORDER — TECHNETIUM TC 99M TETROFOSMIN IV KIT
30.0000 | PACK | Freq: Once | INTRAVENOUS | Status: AC | PRN
  Administered 2019-10-30: 33 via INTRAVENOUS

## 2019-11-06 ENCOUNTER — Telehealth: Payer: Self-pay | Admitting: *Deleted

## 2019-11-06 DIAGNOSIS — D3161 Benign neoplasm of unspecified site of right orbit: Secondary | ICD-10-CM | POA: Diagnosis not present

## 2019-11-06 DIAGNOSIS — H0012 Chalazion right lower eyelid: Secondary | ICD-10-CM | POA: Diagnosis not present

## 2019-11-06 DIAGNOSIS — H11441 Conjunctival cysts, right eye: Secondary | ICD-10-CM | POA: Diagnosis not present

## 2019-11-06 DIAGNOSIS — G4730 Sleep apnea, unspecified: Secondary | ICD-10-CM | POA: Diagnosis not present

## 2019-11-06 DIAGNOSIS — Z951 Presence of aortocoronary bypass graft: Secondary | ICD-10-CM | POA: Diagnosis not present

## 2019-11-06 DIAGNOSIS — Z7982 Long term (current) use of aspirin: Secondary | ICD-10-CM | POA: Diagnosis not present

## 2019-11-06 DIAGNOSIS — I2510 Atherosclerotic heart disease of native coronary artery without angina pectoris: Secondary | ICD-10-CM | POA: Diagnosis not present

## 2019-11-06 DIAGNOSIS — E669 Obesity, unspecified: Secondary | ICD-10-CM | POA: Diagnosis not present

## 2019-11-06 DIAGNOSIS — E119 Type 2 diabetes mellitus without complications: Secondary | ICD-10-CM | POA: Diagnosis not present

## 2019-11-06 DIAGNOSIS — Z888 Allergy status to other drugs, medicaments and biological substances status: Secondary | ICD-10-CM | POA: Diagnosis not present

## 2019-11-06 DIAGNOSIS — Z6831 Body mass index (BMI) 31.0-31.9, adult: Secondary | ICD-10-CM | POA: Diagnosis not present

## 2019-11-06 DIAGNOSIS — Z79899 Other long term (current) drug therapy: Secondary | ICD-10-CM | POA: Diagnosis not present

## 2019-11-06 DIAGNOSIS — K219 Gastro-esophageal reflux disease without esophagitis: Secondary | ICD-10-CM | POA: Diagnosis not present

## 2019-11-06 DIAGNOSIS — I10 Essential (primary) hypertension: Secondary | ICD-10-CM | POA: Diagnosis not present

## 2019-11-06 DIAGNOSIS — Z7984 Long term (current) use of oral hypoglycemic drugs: Secondary | ICD-10-CM | POA: Diagnosis not present

## 2019-11-06 DIAGNOSIS — H5789 Other specified disorders of eye and adnexa: Secondary | ICD-10-CM | POA: Diagnosis not present

## 2019-11-06 NOTE — Telephone Encounter (Signed)
-----   Message from Arnoldo Lenis, MD sent at 11/04/2019  2:00 PM EDT ----- Normal stress test, no signs of any new blockages. F/u 6 weeks w/ PA to reevalaute symptoms   Zandra Abts MD

## 2019-11-06 NOTE — Telephone Encounter (Signed)
Pt aware - f/u scheduled - routed to pcp

## 2019-11-14 DIAGNOSIS — G4733 Obstructive sleep apnea (adult) (pediatric): Secondary | ICD-10-CM | POA: Diagnosis not present

## 2019-12-05 ENCOUNTER — Other Ambulatory Visit: Payer: Self-pay | Admitting: Cardiology

## 2019-12-15 ENCOUNTER — Telehealth (INDEPENDENT_AMBULATORY_CARE_PROVIDER_SITE_OTHER): Payer: Medicare HMO | Admitting: Cardiology

## 2019-12-15 ENCOUNTER — Encounter: Payer: Self-pay | Admitting: Cardiology

## 2019-12-15 VITALS — BP 145/78 | HR 49 | Ht 69.0 in | Wt 208.0 lb

## 2019-12-15 DIAGNOSIS — I2510 Atherosclerotic heart disease of native coronary artery without angina pectoris: Secondary | ICD-10-CM | POA: Diagnosis not present

## 2019-12-15 DIAGNOSIS — R0789 Other chest pain: Secondary | ICD-10-CM

## 2019-12-15 NOTE — Patient Instructions (Signed)

## 2019-12-15 NOTE — Progress Notes (Signed)
Virtual Visit via Telephone Note   This visit type was conducted due to national recommendations for restrictions regarding the COVID-19 Pandemic (e.g. social distancing) in an effort to limit this patient's exposure and mitigate transmission in our community.  Due to his co-morbid illnesses, this patient is at least at moderate risk for complications without adequate follow up.  This format is felt to be most appropriate for this patient at this time.  The patient did not have access to video technology/had technical difficulties with video requiring transitioning to audio format only (telephone).  All issues noted in this document were discussed and addressed.  No physical exam could be performed with this format.  Please refer to the patient's chart for his  consent to telehealth for [facility].    Date:  12/15/2019   ID:  Thomas [name], DOB 16-Aug-1950, MRN [id number] The patient was identified using 2 identifiers.  Patient Location: Home Provider Location: Office/Clinic  PCP:  Celene Squibb, MD  Cardiologist:  Carlyle Dolly, MD  Electrophysiologist:  None   Evaluation Performed:  Follow-Up Visit  Chief Complaint:  Follow up visit  History of Present Illness:    Thomas [name] is a 69 y.o. male seen today for follow up of the following medical problems.  1.CAD - history of LIMA-LAD and SVG-OM2 CABG 01/2016. LVEF 50-55% by TEE at the time - 03/2017 echo LVEF 45-50%. Grade Ii diastolic dysfunction.  08/2017 nuclear stress: PVCs, short runs NSVT. Small mild intensity mid to basal inferolatearl defect fixed most consistent with scar. LVEF 44% 10/2017 echo LVEF 50-55%, no WMAs   - metoprolol dosing limited to fatigue and bradycardia, has only been on 12.5mg  daily. Feels    - shooting like pain epigastric midchesttypically at night. Lasts about 30 minutes. Can be better with position changes. Mild SOB at times.Reports PPI compliance, had been an issue berfore -  no exertional symptoms.  - chronic chest pain symptoms fairly stable from prior visits - some exertional symptoms which are somewhat new. Some DOE with activities with high levels of activity.   10/2019 Lexiscan nuclear stress - no ischemia.  - since last visit symptoms are less frequent. Mild twinge at times. Occurs 1-2 times per week    SH: has had covid vaccine.    The patient does not have symptoms concerning for COVID-19 infection (fever, chills, cough, or new shortness of breath).    Past Medical History:  Diagnosis Date  . Acid reflux   . Adenomatous colon polyp 08/2010  . Anxiety   . Arthritis   . Chronic back pain   . Coronary artery disease   . Diabetes mellitus without complication ([place])   . GERD (gastroesophageal reflux disease)   . Gout   . Hiatal hernia   . High cholesterol   . HTN (hypertension)   . Hyperlipidemia   . Renal cyst    Large Left  . Restless legs   . Sleep apnea    uses cpap   Past Surgical History:  Procedure Laterality Date  . APPENDECTOMY  1967  . CARDIAC CATHETERIZATION N/A 01/31/2016   Procedure: Left Heart Cath and Coronary Angiography;  Surgeon: Belva Crome, MD;  Location: [place] [facility];  Service: Cardiovascular;  Laterality: N/A;  . CARDIOVASCULAR STRESS TEST  2004   Treadmill Stress Test  . CARDIOVASCULAR STRESS TEST  2010  . CHOLECYSTECTOMY  07/2006  . COLONOSCOPY  08/2010   RMR: Cecal tubular adenoma removed, otherwise normal  exam. next tcs 08/2015  . CORONARY ARTERY BYPASS GRAFT N/A 02/28/2016   Procedure: CORONARY ARTERY BYPASS GRAFTING x two using left internal mammary artery and right leg greater saphenous vein harvested endoscopically;  Surgeon: Gaye Pollack, MD;  Location: [place] [facility];  Service: Open Heart Surgery;  Laterality: N/A;  . [place]  . TEE WITHOUT CARDIOVERSION N/A 02/28/2016   Procedure: TRANSESOPHAGEAL ECHOCARDIOGRAM (TEE);  Surgeon: Gaye Pollack, MD;  Location: [place];  Service: Open  Heart Surgery;  Laterality: N/A;  . UPPER GASTROINTESTINAL ENDOSCOPY  08/2010   RMR: GERD benign biopsy  . VASECTOMY       Current Meds  Medication Sig  . alfuzosin (UROXATRAL) 10 MG 24 hr tablet Take 1 tablet (10 mg total) by mouth daily with breakfast.  . amLODipine (NORVASC) 10 MG tablet Take 1 tablet (10 mg total) daily by mouth.  Marland Kitchen aspirin EC 81 MG tablet Take 81 mg daily by mouth.  . clotrimazole-betamethasone (LOTRISONE) cream Apply 1 application topically 2 (two) times daily.  . furosemide (LASIX) 40 MG tablet TAKE 1.5 TABLETS (60 MG TOTAL) BY MOUTH AS NEEDED.  Marland Kitchen gabapentin (NEURONTIN) 300 MG capsule Take 300 mg by mouth at bedtime.  . isosorbide mononitrate (IMDUR) 30 MG 24 hr tablet TAKE 1 TABLET (30 MG TOTAL) BY MOUTH 2 (TWO) TIMES DAILY.  Marland Kitchen lisinopril (ZESTRIL) 10 MG tablet TAKE 1 TABLET BY MOUTH EVERY DAY  . metoprolol succinate (TOPROL-XL) 25 MG 24 hr tablet TAKE 1/2 TABLET BY MOUTH EVERY DAY  . mometasone (NASONEX) 50 MCG/ACT nasal spray Place 2 sprays into the nose daily as needed (for nasal congestion).  . nitroGLYCERIN (NITROSTAT) 0.4 MG SL tablet Place 1 tablet (0.4 mg total) under the tongue every 5 (five) minutes as needed for chest pain.  . pantoprazole (PROTONIX) 40 MG tablet Take 40 mg by mouth at bedtime.   . rosuvastatin (CRESTOR) 10 MG tablet Take 10 mg by mouth daily.  Marland Kitchen SYNJARDY XR 08-998 MG TB24 Take 1 tablet by mouth daily.  Marland Kitchen triamterene-hydrochlorothiazide (DYAZIDE) 37.5-25 MG capsule Take 1 capsule by mouth every morning.      Allergies:   Ambien [zolpidem tartrate], Other, and Simvastatin   Social History   Tobacco Use  . Smoking status: Never Smoker  . Smokeless tobacco: Never Used  Vaping Use  . Vaping Use: Never used  Substance Use Topics  . Alcohol use: Yes    Alcohol/week: 2.0 standard drinks    Types: 2 Standard drinks or equivalent per week    Comment: social  . Drug use: No     Family Hx: The patient's family history includes Cancer  in his father and mother; Diabetes in an other family member; Heart attack in an other family member; Lymphoma in his mother; Ulcers in his father and paternal grandfather.  ROS:   Please see the history of present illness.     All other systems reviewed and are negative.   Prior CV studies:   The following studies were reviewed today:  10/2019 nuclear stress  There was no ST segment deviation noted during stress.  Findings consistent with prior inferior myocardial infarction, there is no current ischemia.  This is an intermediate risk study. Risk based on decreased LVEF, there is no current myocardium at jeopardy. Consider correlating LVEF with echo.  The left ventricular ejection fraction is mildly decreased (44%).  Labs/Other Tests and Data Reviewed:    EKG:  No ECG reviewed.  Recent Labs: No results found for requested labs within last 8760 hours.   Recent Lipid Panel Lab Results  Component Value Date/Time   CHOL 193 06/20/2011 11:06 AM   TRIG 250 (H) 06/20/2011 11:06 AM   HDL 41 06/20/2011 11:06 AM   CHOLHDL 4.7 06/20/2011 11:06 AM   LDLCALC 102 (H) 06/20/2011 11:06 AM    Wt Readings from Last 3 Encounters:  12/15/19 208 lb (94.3 kg)  10/01/19 210 lb (95.3 kg)  09/26/19 210 lb 3.2 oz (95.3 kg)     Objective:    Vital Signs:  BP (!) 145/78   Pulse (!) 49   Ht 5\' 9"  (1.753 m)   Wt 208 lb (94.3 kg)   BMI 30.72 kg/m    Normal affect. Normal speech pattern and tone. Comfortable, no apparent distress. No audible signs of sob or wheezing.  ASSESSMENT & PLAN:    1. CAD - recent stress test without significant ischemia - nonspecific chest pains less frequent since last visit, monitor at this time. If recurrent try increasing imdur.   COVID-19 Education: The signs and symptoms of COVID-19 were discussed with the patient and how to seek care for testing (follow up with PCP or arrange E-visit).  The importance of social distancing was discussed today.  Time:     Today, I have spent 12 minutes with the patient with telehealth technology discussing the above problems.     Medication Adjustments/Labs and Tests Ordered: Current medicines are reviewed at length with the patient today.  Concerns regarding medicines are outlined above.   Tests Ordered: No orders of the defined types were placed in this encounter.   Medication Changes: No orders of the defined types were placed in this encounter.   Follow Up:  In Person in 6 month(s)  Signed, Carlyle Dolly, MD  12/15/2019 8:58 AM    [place]

## 2019-12-17 DIAGNOSIS — L738 Other specified follicular disorders: Secondary | ICD-10-CM | POA: Diagnosis not present

## 2019-12-17 DIAGNOSIS — Z85828 Personal history of other malignant neoplasm of skin: Secondary | ICD-10-CM | POA: Diagnosis not present

## 2019-12-17 DIAGNOSIS — L821 Other seborrheic keratosis: Secondary | ICD-10-CM | POA: Diagnosis not present

## 2019-12-17 DIAGNOSIS — L570 Actinic keratosis: Secondary | ICD-10-CM | POA: Diagnosis not present

## 2019-12-17 DIAGNOSIS — D1801 Hemangioma of skin and subcutaneous tissue: Secondary | ICD-10-CM | POA: Diagnosis not present

## 2019-12-17 DIAGNOSIS — L820 Inflamed seborrheic keratosis: Secondary | ICD-10-CM | POA: Diagnosis not present

## 2019-12-17 DIAGNOSIS — D2239 Melanocytic nevi of other parts of face: Secondary | ICD-10-CM | POA: Diagnosis not present

## 2019-12-17 DIAGNOSIS — D225 Melanocytic nevi of trunk: Secondary | ICD-10-CM | POA: Diagnosis not present

## 2019-12-17 DIAGNOSIS — L814 Other melanin hyperpigmentation: Secondary | ICD-10-CM | POA: Diagnosis not present

## 2019-12-26 DIAGNOSIS — E1122 Type 2 diabetes mellitus with diabetic chronic kidney disease: Secondary | ICD-10-CM | POA: Diagnosis not present

## 2019-12-26 DIAGNOSIS — I2510 Atherosclerotic heart disease of native coronary artery without angina pectoris: Secondary | ICD-10-CM | POA: Diagnosis not present

## 2019-12-26 DIAGNOSIS — E1165 Type 2 diabetes mellitus with hyperglycemia: Secondary | ICD-10-CM | POA: Diagnosis not present

## 2019-12-26 DIAGNOSIS — I129 Hypertensive chronic kidney disease with stage 1 through stage 4 chronic kidney disease, or unspecified chronic kidney disease: Secondary | ICD-10-CM | POA: Diagnosis not present

## 2019-12-26 DIAGNOSIS — E782 Mixed hyperlipidemia: Secondary | ICD-10-CM | POA: Diagnosis not present

## 2019-12-26 DIAGNOSIS — R1030 Lower abdominal pain, unspecified: Secondary | ICD-10-CM | POA: Diagnosis not present

## 2019-12-26 DIAGNOSIS — Z0001 Encounter for general adult medical examination with abnormal findings: Secondary | ICD-10-CM | POA: Diagnosis not present

## 2019-12-26 DIAGNOSIS — N1831 Chronic kidney disease, stage 3a: Secondary | ICD-10-CM | POA: Diagnosis not present

## 2019-12-26 DIAGNOSIS — K219 Gastro-esophageal reflux disease without esophagitis: Secondary | ICD-10-CM | POA: Diagnosis not present

## 2019-12-26 DIAGNOSIS — I10 Essential (primary) hypertension: Secondary | ICD-10-CM | POA: Diagnosis not present

## 2020-01-12 DIAGNOSIS — D3101 Benign neoplasm of right conjunctiva: Secondary | ICD-10-CM | POA: Diagnosis not present

## 2020-01-16 DIAGNOSIS — K219 Gastro-esophageal reflux disease without esophagitis: Secondary | ICD-10-CM | POA: Diagnosis not present

## 2020-01-16 DIAGNOSIS — I129 Hypertensive chronic kidney disease with stage 1 through stage 4 chronic kidney disease, or unspecified chronic kidney disease: Secondary | ICD-10-CM | POA: Diagnosis not present

## 2020-01-16 DIAGNOSIS — I2510 Atherosclerotic heart disease of native coronary artery without angina pectoris: Secondary | ICD-10-CM | POA: Diagnosis not present

## 2020-01-16 DIAGNOSIS — R972 Elevated prostate specific antigen [PSA]: Secondary | ICD-10-CM | POA: Diagnosis not present

## 2020-01-16 DIAGNOSIS — E1165 Type 2 diabetes mellitus with hyperglycemia: Secondary | ICD-10-CM | POA: Diagnosis not present

## 2020-01-16 DIAGNOSIS — N183 Chronic kidney disease, stage 3 unspecified: Secondary | ICD-10-CM | POA: Diagnosis not present

## 2020-01-16 DIAGNOSIS — D649 Anemia, unspecified: Secondary | ICD-10-CM | POA: Diagnosis not present

## 2020-01-16 DIAGNOSIS — E782 Mixed hyperlipidemia: Secondary | ICD-10-CM | POA: Diagnosis not present

## 2020-01-16 DIAGNOSIS — E7849 Other hyperlipidemia: Secondary | ICD-10-CM | POA: Diagnosis not present

## 2020-01-16 DIAGNOSIS — I10 Essential (primary) hypertension: Secondary | ICD-10-CM | POA: Diagnosis not present

## 2020-01-20 DIAGNOSIS — N400 Enlarged prostate without lower urinary tract symptoms: Secondary | ICD-10-CM | POA: Diagnosis not present

## 2020-01-20 DIAGNOSIS — E1142 Type 2 diabetes mellitus with diabetic polyneuropathy: Secondary | ICD-10-CM | POA: Diagnosis not present

## 2020-01-20 DIAGNOSIS — I2510 Atherosclerotic heart disease of native coronary artery without angina pectoris: Secondary | ICD-10-CM | POA: Diagnosis not present

## 2020-01-20 DIAGNOSIS — G8929 Other chronic pain: Secondary | ICD-10-CM | POA: Diagnosis not present

## 2020-01-20 DIAGNOSIS — E782 Mixed hyperlipidemia: Secondary | ICD-10-CM | POA: Diagnosis not present

## 2020-01-20 DIAGNOSIS — R600 Localized edema: Secondary | ICD-10-CM | POA: Diagnosis not present

## 2020-01-20 DIAGNOSIS — J309 Allergic rhinitis, unspecified: Secondary | ICD-10-CM | POA: Diagnosis not present

## 2020-01-20 DIAGNOSIS — N401 Enlarged prostate with lower urinary tract symptoms: Secondary | ICD-10-CM | POA: Diagnosis not present

## 2020-01-20 DIAGNOSIS — R1030 Lower abdominal pain, unspecified: Secondary | ICD-10-CM | POA: Diagnosis not present

## 2020-01-20 DIAGNOSIS — I10 Essential (primary) hypertension: Secondary | ICD-10-CM | POA: Diagnosis not present

## 2020-01-20 DIAGNOSIS — E1122 Type 2 diabetes mellitus with diabetic chronic kidney disease: Secondary | ICD-10-CM | POA: Diagnosis not present

## 2020-01-20 DIAGNOSIS — K219 Gastro-esophageal reflux disease without esophagitis: Secondary | ICD-10-CM | POA: Diagnosis not present

## 2020-01-20 DIAGNOSIS — E1165 Type 2 diabetes mellitus with hyperglycemia: Secondary | ICD-10-CM | POA: Diagnosis not present

## 2020-01-20 DIAGNOSIS — E785 Hyperlipidemia, unspecified: Secondary | ICD-10-CM | POA: Diagnosis not present

## 2020-01-20 DIAGNOSIS — E669 Obesity, unspecified: Secondary | ICD-10-CM | POA: Diagnosis not present

## 2020-01-20 DIAGNOSIS — G9009 Other idiopathic peripheral autonomic neuropathy: Secondary | ICD-10-CM | POA: Diagnosis not present

## 2020-01-30 ENCOUNTER — Other Ambulatory Visit: Payer: Self-pay | Admitting: Cardiology

## 2020-02-04 DIAGNOSIS — Z1211 Encounter for screening for malignant neoplasm of colon: Secondary | ICD-10-CM | POA: Diagnosis not present

## 2020-02-11 LAB — COLOGUARD: COLOGUARD: NEGATIVE

## 2020-02-18 ENCOUNTER — Other Ambulatory Visit: Payer: Self-pay | Admitting: Cardiology

## 2020-03-08 DIAGNOSIS — G4733 Obstructive sleep apnea (adult) (pediatric): Secondary | ICD-10-CM | POA: Diagnosis not present

## 2020-03-31 ENCOUNTER — Other Ambulatory Visit: Payer: Self-pay

## 2020-03-31 ENCOUNTER — Ambulatory Visit (INDEPENDENT_AMBULATORY_CARE_PROVIDER_SITE_OTHER): Payer: Medicare HMO | Admitting: Urology

## 2020-03-31 ENCOUNTER — Encounter: Payer: Self-pay | Admitting: Urology

## 2020-03-31 VITALS — BP 129/74 | HR 56 | Temp 97.8°F | Ht 69.0 in | Wt 205.0 lb

## 2020-03-31 DIAGNOSIS — N138 Other obstructive and reflux uropathy: Secondary | ICD-10-CM

## 2020-03-31 DIAGNOSIS — R339 Retention of urine, unspecified: Secondary | ICD-10-CM | POA: Diagnosis not present

## 2020-03-31 DIAGNOSIS — N401 Enlarged prostate with lower urinary tract symptoms: Secondary | ICD-10-CM | POA: Diagnosis not present

## 2020-03-31 LAB — URINALYSIS, ROUTINE W REFLEX MICROSCOPIC
Bilirubin, UA: NEGATIVE
Leukocytes,UA: NEGATIVE
Nitrite, UA: NEGATIVE
Protein,UA: NEGATIVE
Specific Gravity, UA: 1.02 (ref 1.005–1.030)
Urobilinogen, Ur: 0.2 mg/dL (ref 0.2–1.0)
pH, UA: 6 (ref 5.0–7.5)

## 2020-03-31 LAB — MICROSCOPIC EXAMINATION
Bacteria, UA: NONE SEEN
Epithelial Cells (non renal): NONE SEEN /hpf (ref 0–10)
Renal Epithel, UA: NONE SEEN /hpf

## 2020-03-31 LAB — BLADDER SCAN AMB NON-IMAGING: Scan Result: 24

## 2020-03-31 MED ORDER — ALFUZOSIN HCL ER 10 MG PO TB24
10.0000 mg | ORAL_TABLET | Freq: Every day | ORAL | 3 refills | Status: DC
Start: 2020-03-31 — End: 2020-10-01

## 2020-03-31 NOTE — Patient Instructions (Signed)

## 2020-03-31 NOTE — Progress Notes (Signed)
03/31/2020 3:04 PM   Thomas [name] 1950-10-24 [id number]  Referring provider: Celene Squibb, MD [address] [place],  Thomas [name] [id number]  followup incomplete emptying  HPI: [name] is a 69yo here for followup for BPH and incomplete emptying. Patient is currently on uroxatral but has not taken it in 3 days. Stream is strong. Nocturia 1x. He has urinary urgency and urinary frequency. Bladder/suprapubic pain resolved. PVR 24cc.    PMH: Past Medical History:  Diagnosis Date  . Acid reflux   . Adenomatous colon polyp 08/2010  . Anxiety   . Arthritis   . Chronic back pain   . Coronary artery disease   . Diabetes mellitus without complication ([place])   . GERD (gastroesophageal reflux disease)   . Gout   . Hiatal hernia   . High cholesterol   . HTN (hypertension)   . Hyperlipidemia   . Renal cyst    Large Left  . Restless legs   . Sleep apnea    uses cpap    Surgical History: Past Surgical History:  Procedure Laterality Date  . APPENDECTOMY  1967  . CARDIAC CATHETERIZATION N/A 01/31/2016   Procedure: Left Heart Cath and Coronary Angiography;  Surgeon: Belva Crome, MD;  Location: [place] [facility];  Service: Cardiovascular;  Laterality: N/A;  . CARDIOVASCULAR STRESS TEST  2004   Treadmill Stress Test  . CARDIOVASCULAR STRESS TEST  2010  . CHOLECYSTECTOMY  07/2006  . COLONOSCOPY  08/2010   RMR: Cecal tubular adenoma removed, otherwise normal exam. next tcs 08/2015  . CORONARY ARTERY BYPASS GRAFT N/A 02/28/2016   Procedure: CORONARY ARTERY BYPASS GRAFTING x two using left internal mammary artery and right leg greater saphenous vein harvested endoscopically;  Surgeon: Gaye Pollack, MD;  Location: [place] [facility];  Service: Open Heart Surgery;  Laterality: N/A;  . [place]  . TEE WITHOUT CARDIOVERSION N/A 02/28/2016   Procedure: TRANSESOPHAGEAL ECHOCARDIOGRAM (TEE);  Surgeon: Gaye Pollack, MD;  Location: [place];  Service: Open Heart Surgery;  Laterality: N/A;    . UPPER GASTROINTESTINAL ENDOSCOPY  08/2010   RMR: GERD benign biopsy  . VASECTOMY      Home Medications:  Allergies as of 03/31/2020      Reactions   Ambien [zolpidem Tartrate] Other (See Comments)   Caused memory problems   Other    Medication for insomnia   Simvastatin    REACTION: arm numbness Patient currently on this medication (03/2012).      Medication List       Accurate as of March 31, 2020  3:04 PM. If you have any questions, ask your nurse or doctor.        alfuzosin 10 MG 24 hr tablet Commonly known as: UROXATRAL Take 1 tablet (10 mg total) by mouth daily with breakfast.   amLODipine 10 MG tablet Commonly known as: NORVASC Take 1 tablet (10 mg total) daily by mouth.   aspirin EC 81 MG tablet Take 81 mg daily by mouth.   clotrimazole-betamethasone cream Commonly known as: Lotrisone Apply 1 application topically 2 (two) times daily.   furosemide 40 MG tablet Commonly known as: LASIX TAKE 1.5 TABLETS (60 MG TOTAL) BY MOUTH AS NEEDED.   gabapentin 300 MG capsule Commonly known as: NEURONTIN Take 300 mg by mouth at bedtime.   isosorbide mononitrate 30 MG 24 hr tablet Commonly known as: IMDUR TAKE 1 TABLET (30 MG TOTAL) BY MOUTH 2 (TWO) TIMES  DAILY.   lisinopril 10 MG tablet Commonly known as: ZESTRIL TAKE 1 TABLET BY MOUTH EVERY DAY   meloxicam 7.5 MG tablet Commonly known as: MOBIC Take 7.5 mg by mouth 2 (two) times daily.   metoprolol succinate 25 MG 24 hr tablet Commonly known as: TOPROL-XL TAKE 1/2 TABLET BY MOUTH EVERY DAY   mometasone 50 MCG/ACT nasal spray Commonly known as: NASONEX Place 2 sprays into the nose daily as needed (for nasal congestion).   nitroGLYCERIN 0.4 MG SL tablet Commonly known as: NITROSTAT Place 1 tablet (0.4 mg total) under the tongue every 5 (five) minutes as needed for chest pain.   pantoprazole 40 MG tablet Commonly known as: PROTONIX Take 40 mg by mouth at bedtime.   rosuvastatin 10 MG  tablet Commonly known as: CRESTOR Take 10 mg by mouth daily.   Synjardy XR 08-998 MG Tb24 Generic drug: Empagliflozin-metFORMIN HCl ER Take 1 tablet by mouth daily.   triamterene-hydrochlorothiazide 37.5-25 MG capsule Commonly known as: DYAZIDE Take 1 capsule by mouth every morning.       Allergies:  Allergies  Allergen Reactions  . Ambien [Zolpidem Tartrate] Other (See Comments)    Caused memory problems  . Other     Medication for insomnia  . Simvastatin     REACTION: arm numbness Patient currently on this medication (03/2012).    Family History: Family History  Problem Relation Age of Onset  . Lymphoma Mother   . Cancer Mother   . Ulcers Father   . Cancer Father   . Ulcers Paternal Grandfather        Stomach ulcer, resection  . Heart attack Other   . Diabetes Other     Social History:  reports that he has never smoked. He has never used smokeless tobacco. He reports current alcohol use of about 2.0 standard drinks of alcohol per week. He reports that he does not use drugs.  ROS: All other review of systems were reviewed and are negative except what is noted above in HPI  Physical Exam: BP 129/74   Pulse (!) 56   Temp 97.8 F (36.6 C)   Ht 5\' 9"  (1.753 m)   Wt 205 lb (93 kg)   BMI 30.27 kg/m   Constitutional:  Alert and oriented, No acute distress. HEENT: [name], moist mucus membranes.  Trachea midline, no masses. Cardiovascular: No clubbing, cyanosis, or edema. Respiratory: Normal respiratory effort, no increased work of breathing. GI: Abdomen is soft, nontender, nondistended, no abdominal masses GU: No CVA tenderness.  Lymph: No cervical or inguinal lymphadenopathy. Skin: No rashes, bruises or suspicious lesions. Neurologic: Grossly intact, no focal deficits, moving all 4 extremities. Psychiatric: Normal mood and affect.  Laboratory Data: Lab Results  Component Value Date   WBC 10.1 03/02/2016   HGB 10.5 (L) 03/02/2016   HCT 32.6 (L) 03/02/2016    MCV 82.7 03/02/2016   PLT 178 03/02/2016    Lab Results  Component Value Date   CREATININE 0.87 03/02/2016    Lab Results  Component Value Date   PSA 3.42 06/20/2011   PSA 2.79 01/16/2009    Lab Results  Component Value Date   TESTOSTERONE 411.33 01/16/2009    Lab Results  Component Value Date   HGBA1C 6.4 (H) 02/24/2016    Urinalysis    Component Value Date/Time   COLORURINE YELLOW 02/24/2016 1529   APPEARANCEUR CLEAR 02/24/2016 1529   LABSPEC 1.016 02/24/2016 1529   PHURINE 7.0 02/24/2016 1529   GLUCOSEU 100 (A)  02/24/2016 1529   HGBUR NEGATIVE 02/24/2016 1529   HGBUR negative 01/15/2009 1046   BILIRUBINUR neg 08/20/2019 1028   KETONESUR NEGATIVE 02/24/2016 1529   PROTEINUR Negative 08/20/2019 1028   PROTEINUR NEGATIVE 02/24/2016 1529   UROBILINOGEN negative (A) 08/20/2019 1028   UROBILINOGEN 1.0 01/15/2009 1046   NITRITE neg 08/20/2019 1028   NITRITE NEGATIVE 02/24/2016 1529   LEUKOCYTESUR Negative 08/20/2019 1028    No results found for: LABMICR, WBCUA, RBCUA, LABEPIT, MUCUS, BACTERIA  Pertinent Imaging:  No results found for this or any previous visit.  No results found for this or any previous visit.  No results found for this or any previous visit.  No results found for this or any previous visit.  Results for orders placed during the hospital encounter of 06/30/08  US Renal  Narrative Clinical Data: Renal cyst on abdominal CT  RENAL/URINARY TRACT ULTRASOUND  Technique:  Complete ultrasound examination of the urinary tract was performed including evaluation of the kidneys, renal collecting systems, and urinary bladder.  Comparison: None Correlation:  CT abdomen 01/22/2008  Findings: Kidneys measure 12.5 cm length right and 11.2 cm length left. Normal renal cortical thickness and echogenicity bilaterally. Bilobed cyst identified at upper pole left kidney, 9.8 x 6.5 x 8.9 cm. No discrete mural nodularity, septation, or solid  mass. Nonshadowing echogenic foci at inferior pole and mid right kidney. No definite shadowing renal calcifications. No perinephric fluid collections. Bladder unremarkable.  IMPRESSION: Bilobed cyst at upper pole left kidney 9.8 x 6.5 x 8.9 cm, without evidence of solid mass, mural nodularity or septation. Nonspecific tiny echogenic foci in parenchyma of right kidney, of uncertain etiology and significance.  Provider: Mertie Clause  No results found for this or any previous visit.  No results found for this or any previous visit.  No results found for this or any previous visit.   Assessment & Plan:    1. Incomplete emptying of bladder -continue uroxatral 10mg  - BLADDER SCAN AMB NON-IMAGING - Urinalysis, Routine w reflex microscopic  2. Benign prostatic hyperplasia with urinary obstruction Continue uroxatral 10mg    No follow-ups on file.  Nicolette Bang, MD  [facility] [place]

## 2020-03-31 NOTE — Progress Notes (Signed)
Bladder Scan Patient can void: 24 ml Performed By: Durenda Guthrie, lpn   Urological Symptom Review  Patient is experiencing the following symptoms: Frequent urination Hard to postpone urination Get up at night to urinate Penile pain (male only)    Review of Systems  Gastrointestinal (upper)  : Negative for upper GI symptoms  Gastrointestinal (lower) : Negative for lower GI symptoms  Constitutional : Negative for symptoms  Skin: Negative for skin symptoms  Eyes: Negative for eye symptoms  Ear/Nose/Throat : Sinus problems  Hematologic/Lymphatic: Negative for Hematologic/Lymphatic symptoms  Cardiovascular : Chest pain  Respiratory : Negative for respiratory symptoms  Endocrine: Excessive thirst  Musculoskeletal: Back pain  Neurological: Negative for neurological symptoms  Psychologic: Negative for psychiatric symptoms

## 2020-04-07 DIAGNOSIS — G4733 Obstructive sleep apnea (adult) (pediatric): Secondary | ICD-10-CM | POA: Diagnosis not present

## 2020-04-12 DIAGNOSIS — G4733 Obstructive sleep apnea (adult) (pediatric): Secondary | ICD-10-CM | POA: Diagnosis not present

## 2020-05-04 DIAGNOSIS — D649 Anemia, unspecified: Secondary | ICD-10-CM | POA: Diagnosis not present

## 2020-05-04 DIAGNOSIS — E7849 Other hyperlipidemia: Secondary | ICD-10-CM | POA: Diagnosis not present

## 2020-05-04 DIAGNOSIS — I10 Essential (primary) hypertension: Secondary | ICD-10-CM | POA: Diagnosis not present

## 2020-05-04 DIAGNOSIS — E1165 Type 2 diabetes mellitus with hyperglycemia: Secondary | ICD-10-CM | POA: Diagnosis not present

## 2020-05-04 DIAGNOSIS — E782 Mixed hyperlipidemia: Secondary | ICD-10-CM | POA: Diagnosis not present

## 2020-05-04 DIAGNOSIS — I129 Hypertensive chronic kidney disease with stage 1 through stage 4 chronic kidney disease, or unspecified chronic kidney disease: Secondary | ICD-10-CM | POA: Diagnosis not present

## 2020-05-04 DIAGNOSIS — I2510 Atherosclerotic heart disease of native coronary artery without angina pectoris: Secondary | ICD-10-CM | POA: Diagnosis not present

## 2020-05-04 DIAGNOSIS — N183 Chronic kidney disease, stage 3 unspecified: Secondary | ICD-10-CM | POA: Diagnosis not present

## 2020-05-04 DIAGNOSIS — R972 Elevated prostate specific antigen [PSA]: Secondary | ICD-10-CM | POA: Diagnosis not present

## 2020-05-04 DIAGNOSIS — K219 Gastro-esophageal reflux disease without esophagitis: Secondary | ICD-10-CM | POA: Diagnosis not present

## 2020-05-06 DIAGNOSIS — R1030 Lower abdominal pain, unspecified: Secondary | ICD-10-CM | POA: Diagnosis not present

## 2020-05-06 DIAGNOSIS — E782 Mixed hyperlipidemia: Secondary | ICD-10-CM | POA: Diagnosis not present

## 2020-05-06 DIAGNOSIS — I10 Essential (primary) hypertension: Secondary | ICD-10-CM | POA: Diagnosis not present

## 2020-05-06 DIAGNOSIS — R600 Localized edema: Secondary | ICD-10-CM | POA: Diagnosis not present

## 2020-05-06 DIAGNOSIS — K219 Gastro-esophageal reflux disease without esophagitis: Secondary | ICD-10-CM | POA: Diagnosis not present

## 2020-05-06 DIAGNOSIS — I2510 Atherosclerotic heart disease of native coronary artery without angina pectoris: Secondary | ICD-10-CM | POA: Diagnosis not present

## 2020-05-06 DIAGNOSIS — G9009 Other idiopathic peripheral autonomic neuropathy: Secondary | ICD-10-CM | POA: Diagnosis not present

## 2020-05-06 DIAGNOSIS — E1122 Type 2 diabetes mellitus with diabetic chronic kidney disease: Secondary | ICD-10-CM | POA: Diagnosis not present

## 2020-05-06 DIAGNOSIS — E1165 Type 2 diabetes mellitus with hyperglycemia: Secondary | ICD-10-CM | POA: Diagnosis not present

## 2020-05-06 DIAGNOSIS — N401 Enlarged prostate with lower urinary tract symptoms: Secondary | ICD-10-CM | POA: Diagnosis not present

## 2020-05-08 DIAGNOSIS — G4733 Obstructive sleep apnea (adult) (pediatric): Secondary | ICD-10-CM | POA: Diagnosis not present

## 2020-06-02 DIAGNOSIS — G4733 Obstructive sleep apnea (adult) (pediatric): Secondary | ICD-10-CM | POA: Diagnosis not present

## 2020-06-08 DIAGNOSIS — G4733 Obstructive sleep apnea (adult) (pediatric): Secondary | ICD-10-CM | POA: Diagnosis not present

## 2020-06-09 DIAGNOSIS — G4733 Obstructive sleep apnea (adult) (pediatric): Secondary | ICD-10-CM | POA: Diagnosis not present

## 2020-06-11 ENCOUNTER — Other Ambulatory Visit: Payer: Self-pay | Admitting: Cardiology

## 2020-06-14 DIAGNOSIS — Z1152 Encounter for screening for COVID-19: Secondary | ICD-10-CM | POA: Diagnosis not present

## 2020-06-14 DIAGNOSIS — R531 Weakness: Secondary | ICD-10-CM | POA: Diagnosis not present

## 2020-06-14 DIAGNOSIS — Z20822 Contact with and (suspected) exposure to covid-19: Secondary | ICD-10-CM | POA: Diagnosis not present

## 2020-06-14 DIAGNOSIS — R5383 Other fatigue: Secondary | ICD-10-CM | POA: Diagnosis not present

## 2020-06-14 DIAGNOSIS — R0981 Nasal congestion: Secondary | ICD-10-CM | POA: Diagnosis not present

## 2020-06-14 DIAGNOSIS — R051 Acute cough: Secondary | ICD-10-CM | POA: Diagnosis not present

## 2020-06-22 ENCOUNTER — Ambulatory Visit: Payer: Medicare HMO | Admitting: Cardiology

## 2020-06-28 DIAGNOSIS — N401 Enlarged prostate with lower urinary tract symptoms: Secondary | ICD-10-CM | POA: Diagnosis not present

## 2020-06-28 DIAGNOSIS — E1165 Type 2 diabetes mellitus with hyperglycemia: Secondary | ICD-10-CM | POA: Diagnosis not present

## 2020-06-28 DIAGNOSIS — R1030 Lower abdominal pain, unspecified: Secondary | ICD-10-CM | POA: Diagnosis not present

## 2020-06-28 DIAGNOSIS — K219 Gastro-esophageal reflux disease without esophagitis: Secondary | ICD-10-CM | POA: Diagnosis not present

## 2020-06-28 DIAGNOSIS — I2510 Atherosclerotic heart disease of native coronary artery without angina pectoris: Secondary | ICD-10-CM | POA: Diagnosis not present

## 2020-06-28 DIAGNOSIS — R600 Localized edema: Secondary | ICD-10-CM | POA: Diagnosis not present

## 2020-06-28 DIAGNOSIS — E782 Mixed hyperlipidemia: Secondary | ICD-10-CM | POA: Diagnosis not present

## 2020-06-28 DIAGNOSIS — G9009 Other idiopathic peripheral autonomic neuropathy: Secondary | ICD-10-CM | POA: Diagnosis not present

## 2020-06-28 DIAGNOSIS — E1122 Type 2 diabetes mellitus with diabetic chronic kidney disease: Secondary | ICD-10-CM | POA: Diagnosis not present

## 2020-06-28 DIAGNOSIS — I10 Essential (primary) hypertension: Secondary | ICD-10-CM | POA: Diagnosis not present

## 2020-06-28 NOTE — Progress Notes (Deleted)
Cardiology Office Note  Date: 06/28/2020   ID: Thomas [name], Thomas [name] [birth year], MRN [id number]  PCP:  Thomas Squibb, MD  Cardiologist:  Thomas Dolly, MD Electrophysiologist:  None   Chief Complaint: Cardiac follow-up  History of Present Illness: Thomas [name] is a 70 y.o. male with a history of CAD, chest pain, GERD, DM2, hiatal hernia, hyperlipidemia, HTN, OSA on CPAP.  History of LIMA-LAD, SVG-OM1 2017.  LVEF by TEE at that time 50 to 55%.  Repeat echo 04/19/2017 LVEF 45 to 50%, G2 DD.  Nuclear stress 519 PVCs, short runs of NSVT.  Small mild intensity mid to basal inferior lateral defect consistent with scar.  LVEF 44%.  Echo 2019 LVEF 50 to 55%, no WMA's.  Lexiscan nuclear stress test 10/2019.  Last encounter via telemedicine 12/15/2019 by Dr. Harl [name]  Experiencing shooting-like pain epigastric mid chest typically at night lasting 30 minutes.  Improved with position changes.  Mild shortness of breath at times.  No exertional symptoms.  Chronic chest pains fairly stable from prior visit.  Exertional symptoms somewhat new some DOE mild level of activity.  Stress test without significant ischemia.  Nonspecific chest pains less frequent since last visit.  Continue to monitor this time.  If recurrent increase Imdur.  Past Medical History:  Diagnosis Date  . Acid reflux   . Adenomatous colon polyp 08/2010  . Anxiety   . Arthritis   . Chronic back pain   . Coronary artery disease   . Diabetes mellitus without complication ([place])   . GERD (gastroesophageal reflux disease)   . Gout   . Hiatal hernia   . High cholesterol   . HTN (hypertension)   . Hyperlipidemia   . Renal cyst    Large Left  . Restless legs   . Sleep apnea    uses cpap    Past Surgical History:  Procedure Laterality Date  . APPENDECTOMY  1967  . CARDIAC CATHETERIZATION N/A 01/31/2016   Procedure: Left Heart Cath and Coronary Angiography;  Surgeon: Belva Crome, MD;  Location: [facility];  Service:  Cardiovascular;  Laterality: N/A;  . CARDIOVASCULAR STRESS TEST  2004   Treadmill Stress Test  . CARDIOVASCULAR STRESS TEST  2010  . CHOLECYSTECTOMY  07/2006  . COLONOSCOPY  08/2010   RMR: Cecal tubular adenoma removed, otherwise normal exam. next tcs 08/2015  . CORONARY ARTERY BYPASS GRAFT N/A 02/28/2016   Procedure: CORONARY ARTERY BYPASS GRAFTING x two using left internal mammary artery and right leg greater saphenous vein harvested endoscopically;  Surgeon: Gaye Pollack, MD;  Location: [facility];  Service: Open Heart Surgery;  Laterality: N/A;  . [place]  . TEE WITHOUT CARDIOVERSION N/A 02/28/2016   Procedure: TRANSESOPHAGEAL ECHOCARDIOGRAM (TEE);  Surgeon: Gaye Pollack, MD;  Location: [facility];  Service: Open Heart Surgery;  Laterality: N/A;  . UPPER GASTROINTESTINAL ENDOSCOPY  08/2010   RMR: GERD benign biopsy  . VASECTOMY      Current Outpatient Medications  Medication Sig Dispense Refill  . alfuzosin (UROXATRAL) 10 MG 24 hr tablet Take 1 tablet (10 mg total) by mouth at bedtime. 90 tablet 3  . amLODipine (NORVASC) 10 MG tablet Take 1 tablet (10 mg total) daily by mouth. 180 tablet 3  . aspirin EC 81 MG tablet Take 81 mg daily by mouth.    . clotrimazole-betamethasone (LOTRISONE) cream Apply 1 application topically 2 (two) times daily. 30 g 3  .  furosemide (LASIX) 40 MG tablet TAKE 1.5 TABLETS (60 MG TOTAL) BY MOUTH AS NEEDED. 135 tablet 1  . gabapentin (NEURONTIN) 300 MG capsule Take 300 mg by mouth at bedtime.    . isosorbide mononitrate (IMDUR) 30 MG 24 hr tablet TAKE 1 TABLET (30 MG TOTAL) BY MOUTH 2 (TWO) TIMES DAILY. 180 tablet 1  . lisinopril (ZESTRIL) 10 MG tablet TAKE 1 TABLET BY MOUTH EVERY DAY 90 tablet 3  . meloxicam (MOBIC) 7.5 MG tablet Take 7.5 mg by mouth 2 (two) times daily.    . metoprolol succinate (TOPROL-XL) 25 MG 24 hr tablet TAKE 1/2 TABLET BY MOUTH EVERY DAY 45 tablet 2  . mometasone (NASONEX) 50 MCG/ACT nasal spray Place 2 sprays into the nose  daily as needed (for nasal congestion).    . nitroGLYCERIN (NITROSTAT) 0.4 MG SL tablet Place 1 tablet (0.4 mg total) under the tongue every 5 (five) minutes as needed for chest pain. 25 tablet 3  . pantoprazole (PROTONIX) 40 MG tablet Take 40 mg by mouth at bedtime.     . rosuvastatin (CRESTOR) 10 MG tablet Take 10 mg by mouth daily.    Marland Kitchen SYNJARDY XR 08-998 MG TB24 Take 1 tablet by mouth daily.    Marland Kitchen triamterene-hydrochlorothiazide (DYAZIDE) 37.5-25 MG capsule Take 1 capsule by mouth every morning.      No current facility-administered medications for this visit.   Allergies:  Ambien [zolpidem tartrate], Other, and Simvastatin   Social History: The patient  reports that he has never smoked. He has never used smokeless tobacco. He reports current alcohol use of about 2.0 standard drinks of alcohol per week. He reports that he does not use drugs.   Family History: The patient's family history includes Cancer in his father and mother; Diabetes in an other family member; Heart attack in an other family member; Lymphoma in his mother; Ulcers in his father and paternal grandfather.   ROS:  Please see the history of present illness. Otherwise, complete review of systems is positive for {NONE DEFAULTED:18576::"none"}.  All other systems are reviewed and negative.   Physical Exam: VS:  There were no vitals taken for this visit., BMI There is no height or weight on file to calculate BMI.  Wt Readings from Last 3 Encounters:  03/31/20 205 lb (93 kg)  12/15/19 208 lb (94.3 kg)  10/01/19 210 lb (95.3 kg)    General: Patient appears comfortable at rest. HEENT: Conjunctiva and lids normal, oropharynx clear with moist mucosa. Neck: Supple, no elevated JVP or carotid bruits, no thyromegaly. Lungs: Clear to auscultation, nonlabored breathing at rest. Cardiac: Regular rate and rhythm, no S3 or significant systolic murmur, no pericardial rub. Abdomen: Soft, nontender, no hepatomegaly, bowel sounds present,  no guarding or rebound. Extremities: No pitting edema, distal pulses 2+. Skin: Warm and dry. Musculoskeletal: No kyphosis. Neuropsychiatric: Alert and oriented x3, affect grossly appropriate.  ECG:  {EKG/Telemetry Strips Reviewed:9516253904}  Recent Labwork: No results found for requested labs within last 8760 hours.     Component Value Date/Time   CHOL 193 06/20/2011 1106   TRIG 250 (H) 06/20/2011 1106   HDL 41 06/20/2011 1106   CHOLHDL 4.7 06/20/2011 1106   VLDL 50 (H) 06/20/2011 1106   LDLCALC 102 (H) 06/20/2011 1106    Other Studies Reviewed Today:  10/2019 nuclear stress  There was no ST segment deviation noted during stress.  Findings consistent with prior inferior myocardial infarction, there is no current ischemia.  This is an intermediate risk  study. Risk based on decreased LVEF, there is no current myocardium at jeopardy. Consider correlating LVEF with echo.  The left ventricular ejection fraction is mildly decreased (44%).   Assessment and Plan:  1. CAD in native artery      Medication Adjustments/Labs and Tests Ordered: Current medicines are reviewed at length with the patient today.  Concerns regarding medicines are outlined above.   Disposition: Follow-up with ***  Signed, Levell July, NP 06/28/2020 1:19 PM    [facility] at [facility] [place], [place], [place] [address] Phone: [phone]; Fax: [phone]

## 2020-06-29 ENCOUNTER — Ambulatory Visit: Payer: Medicare HMO | Admitting: Family Medicine

## 2020-06-29 DIAGNOSIS — I2510 Atherosclerotic heart disease of native coronary artery without angina pectoris: Secondary | ICD-10-CM

## 2020-07-06 DIAGNOSIS — G4733 Obstructive sleep apnea (adult) (pediatric): Secondary | ICD-10-CM | POA: Diagnosis not present

## 2020-07-07 DIAGNOSIS — G4733 Obstructive sleep apnea (adult) (pediatric): Secondary | ICD-10-CM | POA: Diagnosis not present

## 2020-07-22 ENCOUNTER — Ambulatory Visit: Payer: Medicare HMO | Admitting: Cardiology

## 2020-07-22 ENCOUNTER — Encounter: Payer: Self-pay | Admitting: Cardiology

## 2020-07-22 ENCOUNTER — Other Ambulatory Visit: Payer: Self-pay

## 2020-07-22 VITALS — BP 138/70 | HR 66 | Ht 69.0 in | Wt 210.4 lb

## 2020-07-22 DIAGNOSIS — R0602 Shortness of breath: Secondary | ICD-10-CM

## 2020-07-22 DIAGNOSIS — E782 Mixed hyperlipidemia: Secondary | ICD-10-CM | POA: Diagnosis not present

## 2020-07-22 DIAGNOSIS — I10 Essential (primary) hypertension: Secondary | ICD-10-CM

## 2020-07-22 DIAGNOSIS — I2510 Atherosclerotic heart disease of native coronary artery without angina pectoris: Secondary | ICD-10-CM | POA: Diagnosis not present

## 2020-07-22 NOTE — Progress Notes (Signed)
Clinical Summary Mr. Wernette is a 70 y.o.male seen today for follow up of the following medical problems.  1.CAD - history of LIMA-LAD and SVG-OM2 CABG 01/2016. LVEF 50-55% by TEE at the time - 03/2017 echo LVEF 45-50%. Grade Ii diastolic dysfunction.  08/2017 nuclear stress: PVCs, short runs NSVT. Small mild intensity mid to basal inferolatearl defect fixed most consistent with scar. LVEF 44% 10/2017 echo LVEF 50-55%, no WMAs   - metoprolol dosing limited to fatigue and bradycardia, has only been on 12.5mg  daily.    - shooting like pain epigastric midchesttypically at night. Lasts about 30 minutes. Can be better with position changes. Mild SOB at times.Reports PPI compliance, had been an issue berfore - no exertional symptoms.  - chronic chest pain symptoms fairly stable from prior visits - some exertional symptoms which are somewhat new. Some DOE with activities with high levels of activity.  10/2019 Lexiscan nuclear stress - no ischemia.  - since last visit symptoms are less frequent. Mild twinge at times. Occurs 1-2 times per week  - chronic chest pains unchanged - can have some SOB at times (started prior to recent nasal congestion). Can be sitting gets feeling he needs to take a deep breath - with walking/exertion notes some SOB - occasional LE edema, takes lasix prn, needs about once a week. Overall swelling has increased    2. Irregular heart beat -PVCs noted on stress test - higher beta blocker dose causes fatigue, feels good on toprol 12.5mg  daily   3. HTN - conmpliant with meds  4. Hyperlipidemia - lipitor caused muscle aches. Simvastatin caused side effects.Currently on crestor 10mg  daily, tolerating.  - labs followed by pcp   5. LE edema - he is on prn lasix - reports some recent increase in edema      Past Medical History:  Diagnosis Date  . Acid reflux   . Adenomatous colon polyp 08/2010  . Anxiety   . Arthritis   .  Chronic back pain   . Coronary artery disease   . Diabetes mellitus without complication ([place])   . GERD (gastroesophageal reflux disease)   . Gout   . Hiatal hernia   . High cholesterol   . HTN (hypertension)   . Hyperlipidemia   . Renal cyst    Large Left  . Restless legs   . Sleep apnea    uses cpap     Allergies  Allergen Reactions  . Ambien [Zolpidem Tartrate] Other (See Comments)    Caused memory problems  . Other     Medication for insomnia  . Simvastatin     REACTION: arm numbness Patient currently on this medication (03/2012).     Current Outpatient Medications  Medication Sig Dispense Refill  . alfuzosin (UROXATRAL) 10 MG 24 hr tablet Take 1 tablet (10 mg total) by mouth at bedtime. 90 tablet 3  . amLODipine (NORVASC) 10 MG tablet Take 1 tablet (10 mg total) daily by mouth. 180 tablet 3  . aspirin EC 81 MG tablet Take 81 mg daily by mouth.    . clotrimazole-betamethasone (LOTRISONE) cream Apply 1 application topically 2 (two) times daily. 30 g 3  . furosemide (LASIX) 40 MG tablet TAKE 1.5 TABLETS (60 MG TOTAL) BY MOUTH AS NEEDED. 135 tablet 1  . gabapentin (NEURONTIN) 300 MG capsule Take 300 mg by mouth at bedtime.    . isosorbide mononitrate (IMDUR) 30 MG 24 hr tablet TAKE 1 TABLET (30 MG TOTAL) BY MOUTH 2 (  TWO) TIMES DAILY. 180 tablet 1  . lisinopril (ZESTRIL) 10 MG tablet TAKE 1 TABLET BY MOUTH EVERY DAY 90 tablet 3  . meloxicam (MOBIC) 7.5 MG tablet Take 7.5 mg by mouth 2 (two) times daily.    . metoprolol succinate (TOPROL-XL) 25 MG 24 hr tablet TAKE 1/2 TABLET BY MOUTH EVERY DAY 45 tablet 2  . mometasone (NASONEX) 50 MCG/ACT nasal spray Place 2 sprays into the nose daily as needed (for nasal congestion).    . nitroGLYCERIN (NITROSTAT) 0.4 MG SL tablet Place 1 tablet (0.4 mg total) under the tongue every 5 (five) minutes as needed for chest pain. 25 tablet 3  . pantoprazole (PROTONIX) 40 MG tablet Take 40 mg by mouth at bedtime.     . rosuvastatin (CRESTOR)  10 MG tablet Take 10 mg by mouth daily.    Marland Kitchen SYNJARDY XR 08-998 MG TB24 Take 1 tablet by mouth daily.    Marland Kitchen triamterene-hydrochlorothiazide (DYAZIDE) 37.5-25 MG capsule Take 1 capsule by mouth every morning.      No current facility-administered medications for this visit.     Past Surgical History:  Procedure Laterality Date  . APPENDECTOMY  1967  . CARDIAC CATHETERIZATION N/A 01/31/2016   Procedure: Left Heart Cath and Coronary Angiography;  Surgeon: Belva Crome, MD;  Location: [facility];  Service: Cardiovascular;  Laterality: N/A;  . CARDIOVASCULAR STRESS TEST  2004   Treadmill Stress Test  . CARDIOVASCULAR STRESS TEST  2010  . CHOLECYSTECTOMY  07/2006  . COLONOSCOPY  08/2010   RMR: Cecal tubular adenoma removed, otherwise normal exam. next tcs 08/2015  . CORONARY ARTERY BYPASS GRAFT N/A 02/28/2016   Procedure: CORONARY ARTERY BYPASS GRAFTING x two using left internal mammary artery and right leg greater saphenous vein harvested endoscopically;  Surgeon: Gaye Pollack, MD;  Location: [facility];  Service: Open Heart Surgery;  Laterality: N/A;  . [place]  . TEE WITHOUT CARDIOVERSION N/A 02/28/2016   Procedure: TRANSESOPHAGEAL ECHOCARDIOGRAM (TEE);  Surgeon: Gaye Pollack, MD;  Location: [facility];  Service: Open Heart Surgery;  Laterality: N/A;  . UPPER GASTROINTESTINAL ENDOSCOPY  08/2010   RMR: GERD benign biopsy  . VASECTOMY       Allergies  Allergen Reactions  . Ambien [Zolpidem Tartrate] Other (See Comments)    Caused memory problems  . Other     Medication for insomnia  . Simvastatin     REACTION: arm numbness Patient currently on this medication (03/2012).      Family History  Problem Relation Age of Onset  . Lymphoma Mother   . Cancer Mother   . Ulcers Father   . Cancer Father   . Ulcers Paternal Grandfather        Stomach ulcer, resection  . Heart attack Other   . Diabetes Other      Social History Mr. Partington reports that he has  never smoked. He has never used smokeless tobacco. Mr. Dutton reports current alcohol use of about 2.0 standard drinks of alcohol per week.   Review of Systems CONSTITUTIONAL: No weight loss, fever, chills, weakness or fatigue.  HEENT: Eyes: No visual loss, blurred vision, double vision or yellow sclerae.No hearing loss, sneezing, congestion, runny nose or sore throat.  SKIN: No rash or itching.  CARDIOVASCULAR: per hpi RESPIRATORY: No shortness of breath, cough or sputum.  GASTROINTESTINAL: No anorexia, nausea, vomiting or diarrhea. No abdominal pain or blood.  GENITOURINARY: No burning on urination, no  polyuria NEUROLOGICAL: No headache, dizziness, syncope, paralysis, ataxia, numbness or tingling in the extremities. No change in bowel or bladder control.  MUSCULOSKELETAL: No muscle, back pain, joint pain or stiffness.  LYMPHATICS: No enlarged nodes. No history of splenectomy.  PSYCHIATRIC: No history of depression or anxiety.  ENDOCRINOLOGIC: No reports of sweating, cold or heat intolerance. No polyuria or polydipsia.  Marland Kitchen   Physical Examination Today's Vitals   07/22/20 1114  BP: 138/70  Pulse: 66  SpO2: 97%  Weight: 210 lb 6.4 oz (95.4 kg)  Height: 5\' 9"  (1.753 m)   Body mass index is 31.07 kg/m.  Gen: resting comfortably, no acute distress HEENT: no scleral icterus, pupils equal round and reactive, no palptable cervical adenopathy,  CV: RRR, no m/r/g, no jvd Resp: Clear to auscultation bilaterally GI: abdomen is soft, non-tender, non-distended, normal bowel sounds, no hepatosplenomegaly MSK: extremities are warm, trace bilateral edema  Skin: warm, no rash Neuro:  no focal deficits Psych: appropriate affect   Diagnostic Studies 10/2019 nuclear stress  There was no ST segment deviation noted during stress.  Findings consistent with prior inferior myocardial infarction, there is no current ischemia.  This is an intermediate risk study. Risk based on decreased LVEF,  there is no current myocardium at jeopardy. Consider correlating LVEF with echo.  The left ventricular ejection fraction is mildly decreased (44%).     Assessment and Plan  1. CAD - chronic nonspecific chest pains unchanged. Stress test last year was benign - recent DOE unclaer etiology, will obtain an echo EKG shows sinus brady, no acute ischemic changes  2. HTN -essentially at goal, continue current meds  3. Hyperlipidemia -request pcp labs, continue statin   Arnoldo Lenis, M.D.

## 2020-07-22 NOTE — Patient Instructions (Addendum)
Medication Instructions:  Your physician recommends that you continue on your current medications as directed. Please refer to the Current Medication list given to you today.  *If you need a refill on your cardiac medications before your next appointment, please call your pharmacy*   Lab Work: Labs from PCP If you have labs (blood work) drawn today and your tests are completely normal, you will receive your results only by: Marland Kitchen MyChart Message (if you have MyChart) OR . A paper copy in the mail If you have any lab test that is abnormal or we need to change your treatment, we will call you to review the results.   Testing/Procedures: Your physician has requested that you have an echocardiogram. Echocardiography is a painless test that uses sound waves to create images of your heart. It provides your doctor with information about the size and shape of your heart and how well your heart's chambers and valves are working. This procedure takes approximately one hour. There are no restrictions for this procedure.    Follow-Up: At [facility], you and your health needs are our priority.  As part of our continuing mission to provide you with exceptional heart care, we have created designated Provider Care Teams.  These Care Teams include your primary Cardiologist (physician) and Advanced Practice Providers (APPs -  Physician Assistants and Nurse Practitioners) who all work together to provide you with the care you need, when you need it.  We recommend signing up for the patient portal called "MyChart".  Sign up information is provided on this After Visit Summary.  MyChart is used to connect with patients for Virtual Visits (Telemedicine).  Patients are able to view lab/test results, encounter notes, upcoming appointments, etc.  Non-urgent messages can be sent to your provider as well.   To learn more about what you can do with MyChart, go to [email or web address].    Your next appointment:   6  month(s)  The format for your next appointment:   In Person  Provider:   Carlyle Dolly, MD or APP   Other Instructions None

## 2020-07-23 DIAGNOSIS — R0981 Nasal congestion: Secondary | ICD-10-CM | POA: Diagnosis not present

## 2020-07-23 DIAGNOSIS — J0100 Acute maxillary sinusitis, unspecified: Secondary | ICD-10-CM | POA: Diagnosis not present

## 2020-07-28 DIAGNOSIS — E1122 Type 2 diabetes mellitus with diabetic chronic kidney disease: Secondary | ICD-10-CM | POA: Diagnosis not present

## 2020-07-28 DIAGNOSIS — R600 Localized edema: Secondary | ICD-10-CM | POA: Diagnosis not present

## 2020-07-28 DIAGNOSIS — G9009 Other idiopathic peripheral autonomic neuropathy: Secondary | ICD-10-CM | POA: Diagnosis not present

## 2020-07-28 DIAGNOSIS — I10 Essential (primary) hypertension: Secondary | ICD-10-CM | POA: Diagnosis not present

## 2020-07-28 DIAGNOSIS — K219 Gastro-esophageal reflux disease without esophagitis: Secondary | ICD-10-CM | POA: Diagnosis not present

## 2020-07-28 DIAGNOSIS — E782 Mixed hyperlipidemia: Secondary | ICD-10-CM | POA: Diagnosis not present

## 2020-07-28 DIAGNOSIS — N401 Enlarged prostate with lower urinary tract symptoms: Secondary | ICD-10-CM | POA: Diagnosis not present

## 2020-07-28 DIAGNOSIS — E1165 Type 2 diabetes mellitus with hyperglycemia: Secondary | ICD-10-CM | POA: Diagnosis not present

## 2020-07-28 DIAGNOSIS — I2510 Atherosclerotic heart disease of native coronary artery without angina pectoris: Secondary | ICD-10-CM | POA: Diagnosis not present

## 2020-07-28 DIAGNOSIS — R1030 Lower abdominal pain, unspecified: Secondary | ICD-10-CM | POA: Diagnosis not present

## 2020-07-31 ENCOUNTER — Other Ambulatory Visit: Payer: Self-pay | Admitting: Cardiology

## 2020-08-06 DIAGNOSIS — G4733 Obstructive sleep apnea (adult) (pediatric): Secondary | ICD-10-CM | POA: Diagnosis not present

## 2020-08-07 DIAGNOSIS — G4733 Obstructive sleep apnea (adult) (pediatric): Secondary | ICD-10-CM | POA: Diagnosis not present

## 2020-08-12 ENCOUNTER — Other Ambulatory Visit: Payer: Self-pay

## 2020-08-12 ENCOUNTER — Ambulatory Visit (HOSPITAL_COMMUNITY)
Admission: RE | Admit: 2020-08-12 | Discharge: 2020-08-12 | Disposition: A | Discharge status: Home / Self Care | Payer: Medicare HMO | Source: Ambulatory Visit | Attending: Cardiology | Admitting: Cardiology

## 2020-08-12 DIAGNOSIS — R0602 Shortness of breath: Secondary | ICD-10-CM

## 2020-08-12 LAB — ECHOCARDIOGRAM COMPLETE
Area-P 1/2: 3.3 cm2
S' Lateral: 3.4 cm

## 2020-08-12 NOTE — Progress Notes (Signed)
*  PRELIMINARY RESULTS* Echocardiogram 2D Echocardiogram has been performed.  Thomas [name] 08/12/2020, 12:05 PM

## 2020-08-17 ENCOUNTER — Telehealth: Payer: Self-pay

## 2020-08-17 NOTE — Telephone Encounter (Signed)
-----   Message from Thomas Lenis, MD sent at 08/17/2020  8:36 AM EDT ----- Echo overall looks good. Heart pumping function remains normal, does have some age related stiffness of the heart which is common and can cause some SOB symptoms, treatment is just controlling bp and continuing fluid pill  Zandra Abts MD

## 2020-08-17 NOTE — Telephone Encounter (Signed)
Patient notified and verbalized understanding. Patient had no questions or concerns at this time.  

## 2020-08-29 DIAGNOSIS — I10 Essential (primary) hypertension: Secondary | ICD-10-CM | POA: Diagnosis not present

## 2020-08-29 DIAGNOSIS — K219 Gastro-esophageal reflux disease without esophagitis: Secondary | ICD-10-CM | POA: Diagnosis not present

## 2020-09-05 DIAGNOSIS — G4733 Obstructive sleep apnea (adult) (pediatric): Secondary | ICD-10-CM | POA: Diagnosis not present

## 2020-09-07 DIAGNOSIS — E782 Mixed hyperlipidemia: Secondary | ICD-10-CM | POA: Diagnosis not present

## 2020-09-07 DIAGNOSIS — I10 Essential (primary) hypertension: Secondary | ICD-10-CM | POA: Diagnosis not present

## 2020-09-07 DIAGNOSIS — E1165 Type 2 diabetes mellitus with hyperglycemia: Secondary | ICD-10-CM | POA: Diagnosis not present

## 2020-09-17 DIAGNOSIS — G4733 Obstructive sleep apnea (adult) (pediatric): Secondary | ICD-10-CM | POA: Diagnosis not present

## 2020-09-20 ENCOUNTER — Other Ambulatory Visit: Payer: Self-pay | Admitting: Cardiology

## 2020-09-28 DIAGNOSIS — I10 Essential (primary) hypertension: Secondary | ICD-10-CM | POA: Diagnosis not present

## 2020-09-28 DIAGNOSIS — I2510 Atherosclerotic heart disease of native coronary artery without angina pectoris: Secondary | ICD-10-CM | POA: Diagnosis not present

## 2020-09-28 DIAGNOSIS — E669 Obesity, unspecified: Secondary | ICD-10-CM | POA: Diagnosis not present

## 2020-09-28 DIAGNOSIS — R600 Localized edema: Secondary | ICD-10-CM | POA: Diagnosis not present

## 2020-09-28 DIAGNOSIS — R945 Abnormal results of liver function studies: Secondary | ICD-10-CM | POA: Diagnosis not present

## 2020-09-28 DIAGNOSIS — R350 Frequency of micturition: Secondary | ICD-10-CM | POA: Diagnosis not present

## 2020-09-28 DIAGNOSIS — M5451 Vertebrogenic low back pain: Secondary | ICD-10-CM | POA: Diagnosis not present

## 2020-09-28 DIAGNOSIS — E1122 Type 2 diabetes mellitus with diabetic chronic kidney disease: Secondary | ICD-10-CM | POA: Diagnosis not present

## 2020-09-28 DIAGNOSIS — K219 Gastro-esophageal reflux disease without esophagitis: Secondary | ICD-10-CM | POA: Diagnosis not present

## 2020-09-28 DIAGNOSIS — E782 Mixed hyperlipidemia: Secondary | ICD-10-CM | POA: Diagnosis not present

## 2020-09-30 ENCOUNTER — Ambulatory Visit: Payer: Medicare HMO | Admitting: Urology

## 2020-10-01 ENCOUNTER — Encounter: Payer: Self-pay | Admitting: Urology

## 2020-10-01 ENCOUNTER — Other Ambulatory Visit: Payer: Self-pay

## 2020-10-01 ENCOUNTER — Ambulatory Visit (INDEPENDENT_AMBULATORY_CARE_PROVIDER_SITE_OTHER): Payer: Medicare HMO | Admitting: Urology

## 2020-10-01 VITALS — BP 162/84 | HR 58 | Ht 69.0 in | Wt 211.0 lb

## 2020-10-01 DIAGNOSIS — R339 Retention of urine, unspecified: Secondary | ICD-10-CM | POA: Diagnosis not present

## 2020-10-01 DIAGNOSIS — I10 Essential (primary) hypertension: Secondary | ICD-10-CM | POA: Diagnosis not present

## 2020-10-01 DIAGNOSIS — E1142 Type 2 diabetes mellitus with diabetic polyneuropathy: Secondary | ICD-10-CM | POA: Diagnosis not present

## 2020-10-01 DIAGNOSIS — G8929 Other chronic pain: Secondary | ICD-10-CM | POA: Diagnosis not present

## 2020-10-01 DIAGNOSIS — M1990 Unspecified osteoarthritis, unspecified site: Secondary | ICD-10-CM | POA: Diagnosis not present

## 2020-10-01 DIAGNOSIS — E669 Obesity, unspecified: Secondary | ICD-10-CM | POA: Diagnosis not present

## 2020-10-01 DIAGNOSIS — N138 Other obstructive and reflux uropathy: Secondary | ICD-10-CM

## 2020-10-01 DIAGNOSIS — E1165 Type 2 diabetes mellitus with hyperglycemia: Secondary | ICD-10-CM | POA: Diagnosis not present

## 2020-10-01 DIAGNOSIS — G4733 Obstructive sleep apnea (adult) (pediatric): Secondary | ICD-10-CM | POA: Diagnosis not present

## 2020-10-01 DIAGNOSIS — E785 Hyperlipidemia, unspecified: Secondary | ICD-10-CM | POA: Diagnosis not present

## 2020-10-01 DIAGNOSIS — I25119 Atherosclerotic heart disease of native coronary artery with unspecified angina pectoris: Secondary | ICD-10-CM | POA: Diagnosis not present

## 2020-10-01 DIAGNOSIS — K219 Gastro-esophageal reflux disease without esophagitis: Secondary | ICD-10-CM | POA: Diagnosis not present

## 2020-10-01 DIAGNOSIS — N401 Enlarged prostate with lower urinary tract symptoms: Secondary | ICD-10-CM | POA: Diagnosis not present

## 2020-10-01 LAB — URINALYSIS, ROUTINE W REFLEX MICROSCOPIC
Bilirubin, UA: NEGATIVE
Leukocytes,UA: NEGATIVE
Nitrite, UA: NEGATIVE
RBC, UA: NEGATIVE
Specific Gravity, UA: 1.02 (ref 1.005–1.030)
Urobilinogen, Ur: 0.2 mg/dL (ref 0.2–1.0)
pH, UA: 7 (ref 5.0–7.5)

## 2020-10-01 LAB — MICROSCOPIC EXAMINATION
Bacteria, UA: NONE SEEN
Epithelial Cells (non renal): NONE SEEN /hpf (ref 0–10)
RBC, Urine: NONE SEEN /hpf (ref 0–2)
Renal Epithel, UA: NONE SEEN /hpf
WBC, UA: NONE SEEN /hpf (ref 0–5)

## 2020-10-01 MED ORDER — CLOTRIMAZOLE-BETAMETHASONE 1-0.05 % EX CREA: 1.0000 "application " | TOPICAL_CREAM | Freq: Two times a day (BID) | CUTANEOUS | 3 refills | Status: DC

## 2020-10-01 MED ORDER — ALFUZOSIN HCL ER 10 MG PO TB24: 10.0000 mg | ORAL_TABLET | Freq: Every day | ORAL | 3 refills | Status: DC

## 2020-10-01 NOTE — Patient Instructions (Signed)

## 2020-10-01 NOTE — Progress Notes (Signed)
10/01/2020 2:02 PM   Thomas [name] 03/25/1951 [id number]  Referring provider: Celene Squibb, MD [place],  [place] [id number]  Followup BPH  HPI: Thomas [name] is a 70yo here for followup for BPH and nocturia. IPSS 15 QOL 3. He takes uroxatral intermittently which does help his LUTS. He is on jardiance which worsens his LUTS. He takes lasix prn for lower extremity edema. He has issues with intermittent balanitis which he uses clotrimazole.    PMH: Past Medical History:  Diagnosis Date  . Acid reflux   . Adenomatous colon polyp 08/2010  . Anxiety   . Arthritis   . Chronic back pain   . Coronary artery disease   . Diabetes mellitus without complication ([place])   . GERD (gastroesophageal reflux disease)   . Gout   . Hiatal hernia   . High cholesterol   . HTN (hypertension)   . Hyperlipidemia   . Renal cyst    Large Left  . Restless legs   . Sleep apnea    uses cpap    Surgical History: Past Surgical History:  Procedure Laterality Date  . APPENDECTOMY  1967  . CARDIAC CATHETERIZATION N/A 01/31/2016   Procedure: Left Heart Cath and Coronary Angiography;  Surgeon: Thomas Crome, MD;  Location: [facility];  Service: Cardiovascular;  Laterality: N/A;  . CARDIOVASCULAR STRESS TEST  2004   Treadmill Stress Test  . CARDIOVASCULAR STRESS TEST  2010  . CHOLECYSTECTOMY  07/2006  . COLONOSCOPY  08/2010   RMR: Cecal tubular adenoma removed, otherwise normal exam. next tcs 08/2015  . CORONARY ARTERY BYPASS GRAFT N/A 02/28/2016   Procedure: CORONARY ARTERY BYPASS GRAFTING x two using left internal mammary artery and right leg greater saphenous vein harvested endoscopically;  Surgeon: Thomas Pollack, MD;  Location: [facility];  Service: Open Heart Surgery;  Laterality: N/A;  . [place]  . TEE WITHOUT CARDIOVERSION N/A 02/28/2016   Procedure: TRANSESOPHAGEAL ECHOCARDIOGRAM (TEE);  Surgeon: Thomas Pollack, MD;  Location: [facility];  Service: Open Heart  Surgery;  Laterality: N/A;  . UPPER GASTROINTESTINAL ENDOSCOPY  08/2010   RMR: GERD benign biopsy  . VASECTOMY      Home Medications:  Allergies as of 10/01/2020      Reactions   Ambien [zolpidem Tartrate] Other (See Comments)   Caused memory problems   Other    Medication for insomnia   Simvastatin    REACTION: arm numbness Patient currently on this medication (03/2012).      Medication List       Accurate as of October 01, 2020  2:02 PM. If you have any questions, ask your nurse or doctor.        alfuzosin 10 MG 24 hr tablet Commonly known as: UROXATRAL Take 1 tablet (10 mg total) by mouth at bedtime.   amLODipine 10 MG tablet Commonly known as: NORVASC Take 1 tablet (10 mg total) daily by mouth.   aspirin EC 81 MG tablet Take 81 mg daily by mouth.   clotrimazole-betamethasone cream Commonly known as: Lotrisone Apply 1 application topically 2 (two) times daily.   furosemide 40 MG tablet Commonly known as: LASIX TAKE 1.5 TABLETS (60 MG TOTAL) BY MOUTH AS NEEDED.   gabapentin 300 MG capsule Commonly known as: NEURONTIN Take 300 mg by mouth at bedtime.   isosorbide mononitrate 30 MG 24 hr tablet Commonly known as: IMDUR TAKE 1 TABLET (30 MG TOTAL)  BY MOUTH 2 (TWO) TIMES DAILY.   lisinopril 10 MG tablet Commonly known as: ZESTRIL TAKE 1 TABLET BY MOUTH EVERY DAY   meloxicam 7.5 MG tablet Commonly known as: MOBIC Take 7.5 mg by mouth 2 (two) times daily.   metoprolol succinate 25 MG 24 hr tablet Commonly known as: TOPROL-XL TAKE 1/2 TABLET BY MOUTH EVERY DAY   mometasone 50 MCG/ACT nasal spray Commonly known as: NASONEX Place 2 sprays into the nose daily as needed (for nasal congestion).   nitroGLYCERIN 0.4 MG SL tablet Commonly known as: NITROSTAT Place 1 tablet (0.4 mg total) under the tongue every 5 (five) minutes as needed for chest pain.   pantoprazole 40 MG tablet Commonly known as: PROTONIX Take 40 mg by mouth at bedtime.   rosuvastatin 10 MG  tablet Commonly known as: CRESTOR Take 10 mg by mouth daily.   Synjardy XR 01-999 MG Tb24 Generic drug: Empagliflozin-metFORMIN HCl ER Take 10 mg by mouth daily. What changed: Another medication with the same name was removed. Continue taking this medication, and follow the directions you see here. Changed by: Thomas Bang, MD   triamterene-hydrochlorothiazide 37.5-25 MG capsule Commonly known as: DYAZIDE Take 1 capsule by mouth every morning.       Allergies:  Allergies  Allergen Reactions  . Ambien [Zolpidem Tartrate] Other (See Comments)    Caused memory problems  . Other     Medication for insomnia  . Simvastatin     REACTION: arm numbness Patient currently on this medication (03/2012).    Family History: Family History  Problem Relation Age of Onset  . Lymphoma Mother   . Cancer Mother   . Ulcers Father   . Cancer Father   . Ulcers Paternal Grandfather        Stomach ulcer, resection  . Heart attack Other   . Diabetes Other     Social History:  reports that he has never smoked. He has never used smokeless tobacco. He reports current alcohol use of about 2.0 standard drinks of alcohol per week. He reports that he does not use drugs.  ROS: All other review of systems were reviewed and are negative except what is noted above in HPI  Physical Exam: BP (!) 162/84   Pulse (!) 58   Ht 5\' 9"  (1.753 m)   Wt 211 lb (95.7 kg)   BMI 31.16 kg/m   Constitutional:  Alert and oriented, No acute distress. HEENT: [place] AT, moist mucus membranes.  Trachea midline, no masses. Cardiovascular: No clubbing, cyanosis, or edema. Respiratory: Normal respiratory effort, no increased work of breathing. GI: Abdomen is soft, nontender, nondistended, no abdominal masses GU: No CVA tenderness.  Lymph: No cervical or inguinal lymphadenopathy. Skin: No rashes, bruises or suspicious lesions. Neurologic: Grossly intact, no focal deficits, moving all 4 extremities. Psychiatric:  Normal mood and affect.  Laboratory Data: Lab Results  Component Value Date   WBC 10.1 03/02/2016   HGB 10.5 (L) 03/02/2016   HCT 32.6 (L) 03/02/2016   MCV 82.7 03/02/2016   PLT 178 03/02/2016    Lab Results  Component Value Date   CREATININE 0.87 03/02/2016    Lab Results  Component Value Date   PSA 3.42 06/20/2011   PSA 2.79 01/16/2009    Lab Results  Component Value Date   TESTOSTERONE 411.33 01/16/2009    Lab Results  Component Value Date   HGBA1C 6.4 (H) 02/24/2016    Urinalysis    Component Value Date/Time   COLORURINE YELLOW  02/24/2016 1529   APPEARANCEUR Clear 03/31/2020 1501   LABSPEC 1.016 02/24/2016 1529   PHURINE 7.0 02/24/2016 1529   GLUCOSEU 3+ (A) 03/31/2020 1501   HGBUR NEGATIVE 02/24/2016 1529   HGBUR negative 01/15/2009 1046   BILIRUBINUR Negative 03/31/2020 1501   KETONESUR NEGATIVE 02/24/2016 1529   PROTEINUR Negative 03/31/2020 1501   PROTEINUR NEGATIVE 02/24/2016 1529   UROBILINOGEN negative (A) 08/20/2019 1028   UROBILINOGEN 1.0 01/15/2009 1046   NITRITE Negative 03/31/2020 1501   NITRITE NEGATIVE 02/24/2016 1529   LEUKOCYTESUR Negative 03/31/2020 1501    Lab Results  Component Value Date   LABMICR See below: 03/31/2020   WBCUA 0-5 03/31/2020   LABEPIT None seen 03/31/2020   BACTERIA None seen 03/31/2020    Pertinent Imaging:  No results found for this or any previous visit.  No results found for this or any previous visit.  No results found for this or any previous visit.  No results found for this or any previous visit.  Results for orders placed during the hospital encounter of 06/30/08  US Renal  Narrative Clinical Data: Renal cyst on abdominal CT  RENAL/URINARY TRACT ULTRASOUND  Technique:  Complete ultrasound examination of the urinary tract was performed including evaluation of the kidneys, renal collecting systems, and urinary bladder.  Comparison: None Correlation:  CT abdomen  01/22/2008  Findings: Kidneys measure 12.5 cm length right and 11.2 cm length left. Normal renal cortical thickness and echogenicity bilaterally. Bilobed cyst identified at upper pole left kidney, 9.8 x 6.5 x 8.9 cm. No discrete mural nodularity, septation, or solid mass. Nonshadowing echogenic foci at inferior pole and mid right kidney. No definite shadowing renal calcifications. No perinephric fluid collections. Bladder unremarkable.  IMPRESSION: Bilobed cyst at upper pole left kidney 9.8 x 6.5 x 8.9 cm, without evidence of solid mass, mural nodularity or septation. Nonspecific tiny echogenic foci in parenchyma of right kidney, of uncertain etiology and significance.  Provider: Mertie Clause  No results found for this or any previous visit.  No results found for this or any previous visit.  No results found for this or any previous visit.   Assessment & Plan:    1. Incomplete emptying of bladder -Continue uroxatral 10mg  qhs - BLADDER SCAN AMB NON-IMAGING - Urinalysis, Routine w reflex microscopic  2. Benign prostatic hyperplasia with urinary obstruction -continue uroxatral 10mg  qhs   No follow-ups on file.  Thomas Bang, MD  [facility] [place]

## 2020-10-01 NOTE — Progress Notes (Signed)
PVR=110 ml  Urological Symptom Review  Patient is experiencing the following symptoms: Get up at night to urinate Erection problems   Review of Systems  Gastrointestinal (upper)  : Negative for upper GI symptoms  Gastrointestinal (lower) : Negative for lower GI symptoms  Constitutional : Fatigue  Skin: Negative for skin symptoms  Eyes: Negative for eye symptoms  Ear/Nose/Throat : Negative for Ear/Nose/Throat symptoms  Hematologic/Lymphatic: Negative for Hematologic/Lymphatic symptoms  Cardiovascular : Negative for cardiovascular symptoms  Respiratory : Negative for respiratory symptoms  Endocrine: Negative for endocrine symptoms  Musculoskeletal: Back pain Joint pain  Neurological: Negative for neurological symptoms  Psychologic: Negative for psychiatric symptoms

## 2020-10-06 DIAGNOSIS — G4733 Obstructive sleep apnea (adult) (pediatric): Secondary | ICD-10-CM | POA: Diagnosis not present

## 2020-10-18 DIAGNOSIS — G4733 Obstructive sleep apnea (adult) (pediatric): Secondary | ICD-10-CM | POA: Diagnosis not present

## 2020-10-28 DIAGNOSIS — R69 Illness, unspecified: Secondary | ICD-10-CM | POA: Diagnosis not present

## 2020-10-28 DIAGNOSIS — E1165 Type 2 diabetes mellitus with hyperglycemia: Secondary | ICD-10-CM | POA: Diagnosis not present

## 2020-10-28 DIAGNOSIS — I10 Essential (primary) hypertension: Secondary | ICD-10-CM | POA: Diagnosis not present

## 2020-11-05 DIAGNOSIS — G4733 Obstructive sleep apnea (adult) (pediatric): Secondary | ICD-10-CM | POA: Diagnosis not present

## 2020-11-17 DIAGNOSIS — G4733 Obstructive sleep apnea (adult) (pediatric): Secondary | ICD-10-CM | POA: Diagnosis not present

## 2020-12-06 DIAGNOSIS — G4733 Obstructive sleep apnea (adult) (pediatric): Secondary | ICD-10-CM | POA: Diagnosis not present

## 2020-12-15 ENCOUNTER — Other Ambulatory Visit: Payer: Self-pay | Admitting: Cardiology

## 2020-12-16 DIAGNOSIS — L570 Actinic keratosis: Secondary | ICD-10-CM | POA: Diagnosis not present

## 2020-12-16 DIAGNOSIS — L821 Other seborrheic keratosis: Secondary | ICD-10-CM | POA: Diagnosis not present

## 2020-12-16 DIAGNOSIS — D1801 Hemangioma of skin and subcutaneous tissue: Secondary | ICD-10-CM | POA: Diagnosis not present

## 2020-12-16 DIAGNOSIS — L814 Other melanin hyperpigmentation: Secondary | ICD-10-CM | POA: Diagnosis not present

## 2020-12-16 DIAGNOSIS — Z85828 Personal history of other malignant neoplasm of skin: Secondary | ICD-10-CM | POA: Diagnosis not present

## 2020-12-21 DIAGNOSIS — G4733 Obstructive sleep apnea (adult) (pediatric): Secondary | ICD-10-CM | POA: Diagnosis not present

## 2021-01-17 DIAGNOSIS — E1122 Type 2 diabetes mellitus with diabetic chronic kidney disease: Secondary | ICD-10-CM | POA: Diagnosis not present

## 2021-01-17 DIAGNOSIS — H2513 Age-related nuclear cataract, bilateral: Secondary | ICD-10-CM | POA: Diagnosis not present

## 2021-01-17 DIAGNOSIS — I10 Essential (primary) hypertension: Secondary | ICD-10-CM | POA: Diagnosis not present

## 2021-01-17 DIAGNOSIS — D2310 Other benign neoplasm of skin of unspecified eyelid, including canthus: Secondary | ICD-10-CM | POA: Diagnosis not present

## 2021-01-20 DIAGNOSIS — Z23 Encounter for immunization: Secondary | ICD-10-CM | POA: Diagnosis not present

## 2021-01-20 DIAGNOSIS — R350 Frequency of micturition: Secondary | ICD-10-CM | POA: Diagnosis not present

## 2021-01-20 DIAGNOSIS — I2510 Atherosclerotic heart disease of native coronary artery without angina pectoris: Secondary | ICD-10-CM | POA: Diagnosis not present

## 2021-01-20 DIAGNOSIS — E1122 Type 2 diabetes mellitus with diabetic chronic kidney disease: Secondary | ICD-10-CM | POA: Diagnosis not present

## 2021-01-20 DIAGNOSIS — K219 Gastro-esophageal reflux disease without esophagitis: Secondary | ICD-10-CM | POA: Diagnosis not present

## 2021-01-20 DIAGNOSIS — I10 Essential (primary) hypertension: Secondary | ICD-10-CM | POA: Diagnosis not present

## 2021-01-20 DIAGNOSIS — M5451 Vertebrogenic low back pain: Secondary | ICD-10-CM | POA: Diagnosis not present

## 2021-01-20 DIAGNOSIS — E782 Mixed hyperlipidemia: Secondary | ICD-10-CM | POA: Diagnosis not present

## 2021-01-20 DIAGNOSIS — R945 Abnormal results of liver function studies: Secondary | ICD-10-CM | POA: Diagnosis not present

## 2021-01-20 DIAGNOSIS — Z0001 Encounter for general adult medical examination with abnormal findings: Secondary | ICD-10-CM | POA: Diagnosis not present

## 2021-01-21 DIAGNOSIS — G4733 Obstructive sleep apnea (adult) (pediatric): Secondary | ICD-10-CM | POA: Diagnosis not present

## 2021-01-28 DIAGNOSIS — I10 Essential (primary) hypertension: Secondary | ICD-10-CM | POA: Diagnosis not present

## 2021-01-28 DIAGNOSIS — E1165 Type 2 diabetes mellitus with hyperglycemia: Secondary | ICD-10-CM | POA: Diagnosis not present

## 2021-02-20 DIAGNOSIS — G4733 Obstructive sleep apnea (adult) (pediatric): Secondary | ICD-10-CM | POA: Diagnosis not present

## 2021-02-28 DIAGNOSIS — I10 Essential (primary) hypertension: Secondary | ICD-10-CM | POA: Diagnosis not present

## 2021-02-28 DIAGNOSIS — E1165 Type 2 diabetes mellitus with hyperglycemia: Secondary | ICD-10-CM | POA: Diagnosis not present

## 2021-03-18 ENCOUNTER — Encounter: Payer: Self-pay | Admitting: Cardiology

## 2021-03-18 ENCOUNTER — Ambulatory Visit: Payer: Medicare HMO | Admitting: Cardiology

## 2021-03-18 ENCOUNTER — Other Ambulatory Visit: Payer: Self-pay

## 2021-03-18 VITALS — BP 140/90 | HR 56 | Ht 69.0 in | Wt 205.4 lb

## 2021-03-18 DIAGNOSIS — I2510 Atherosclerotic heart disease of native coronary artery without angina pectoris: Secondary | ICD-10-CM

## 2021-03-18 DIAGNOSIS — E782 Mixed hyperlipidemia: Secondary | ICD-10-CM

## 2021-03-18 DIAGNOSIS — I10 Essential (primary) hypertension: Secondary | ICD-10-CM | POA: Diagnosis not present

## 2021-03-18 DIAGNOSIS — I5032 Chronic diastolic (congestive) heart failure: Secondary | ICD-10-CM

## 2021-03-18 NOTE — Progress Notes (Signed)
Clinical Summary Thomas [name] is a 70 y.o.male seen today for follow up of the following medical problems.    1. CAD - history of LIMA-LAD and SVG-OM2 CABG 01/2016. LVEF 50-55% by TEE at the time - 03/2017 echo LVEF 45-50%. Grade Ii diastolic dysfunction.     08/2017 nuclear stress: PVCs, short runs NSVT. Small mild intensity mid to basal inferolatearl defect fixed most consistent with scar. LVEF 44% 10/2017 echo LVEF 50-55%, no WMAs     - metoprolol dosing limited to fatigue and bradycardia, has only been on 12.5mg  daily.    10/2019 Lexiscan nuclear stress:no ischemia.  07/2020 echo: LVEF 60-65%, grade II dd, normal RV function, mild MR   - some chest pains at times.  - episode 3 weeks ago. Mid to left chest - ongoing SOB with activities. DOE at 1/2 block, at grocery store.  - some LE edema at times, resolves with lasix prn. Takes about 3 times a week    2. Irregular heart beat - PVCs noted on stress test - higher beta blocker dose causes fatigue, feels good on toprol 12.5mg  daily  - some palpitations at times. Overall mild     3. HTN - he is compliant with meds   4. Hyperlipidemia - lipitor caused muscle aches. Simvastatin caused side effects. Currently on crestor 10mg  daily, tolerating.  - recent labs with pcp                Past Medical History:  Diagnosis Date   Acid reflux    Adenomatous colon polyp 08/2010   Anxiety    Arthritis    Chronic back pain    Coronary artery disease    Diabetes mellitus without complication ([facility])    GERD (gastroesophageal reflux disease)    Gout    Hiatal hernia    High cholesterol    HTN (hypertension)    Hyperlipidemia    Renal cyst    Large Left   Restless legs    Sleep apnea    uses cpap     Allergies  Allergen Reactions   Ambien [Zolpidem Tartrate] Other (See Comments)    Caused memory problems   Other     Medication for insomnia   Simvastatin     REACTION: arm numbness Patient currently on this  medication (03/2012).     Current Outpatient Medications  Medication Sig Dispense Refill   alfuzosin (UROXATRAL) 10 MG 24 hr tablet Take 1 tablet (10 mg total) by mouth at bedtime. 90 tablet 3   amLODipine (NORVASC) 10 MG tablet Take 1 tablet (10 mg total) daily by mouth. 180 tablet 3   aspirin EC 81 MG tablet Take 81 mg daily by mouth.     clotrimazole-betamethasone (LOTRISONE) cream Apply 1 application topically 2 (two) times daily. 30 g 3   Empagliflozin-metFORMIN HCl ER (SYNJARDY XR) 01-999 MG TB24 Take 10 mg by mouth daily.     furosemide (LASIX) 40 MG tablet TAKE 1.5 TABLETS (60 MG TOTAL) BY MOUTH AS NEEDED. 135 tablet 3   gabapentin (NEURONTIN) 300 MG capsule Take 300 mg by mouth at bedtime.     isosorbide mononitrate (IMDUR) 30 MG 24 hr tablet TAKE 1 TABLET BY MOUTH 2 TIMES DAILY. 180 tablet 1   lisinopril (ZESTRIL) 10 MG tablet TAKE 1 TABLET BY MOUTH EVERY DAY 90 tablet 3   meloxicam (MOBIC) 7.5 MG tablet Take 7.5 mg by mouth 2 (two) times daily.     metoprolol succinate (TOPROL-XL)  25 MG 24 hr tablet TAKE 1/2 TABLET BY MOUTH EVERY DAY 45 tablet 2   mometasone (NASONEX) 50 MCG/ACT nasal spray Place 2 sprays into the nose daily as needed (for nasal congestion).     nitroGLYCERIN (NITROSTAT) 0.4 MG SL tablet Place 1 tablet (0.4 mg total) under the tongue every 5 (five) minutes as needed for chest pain. 25 tablet 3   pantoprazole (PROTONIX) 40 MG tablet Take 40 mg by mouth at bedtime.      rosuvastatin (CRESTOR) 10 MG tablet Take 10 mg by mouth daily.     triamterene-hydrochlorothiazide (DYAZIDE) 37.5-25 MG capsule Take 1 capsule by mouth every morning.      No current facility-administered medications for this visit.     Past Surgical History:  Procedure Laterality Date   APPENDECTOMY  1967   CARDIAC CATHETERIZATION N/A 01/31/2016   Procedure: Left Heart Cath and Coronary Angiography;  Surgeon: Belva Crome, MD;  Location: [facility];  Service: Cardiovascular;   Laterality: N/A;   CARDIOVASCULAR STRESS TEST  2004   Treadmill Stress Test   CARDIOVASCULAR STRESS TEST  2010   CHOLECYSTECTOMY  07/2006   COLONOSCOPY  08/2010   RMR: Cecal tubular adenoma removed, otherwise normal exam. next tcs 08/2015   CORONARY ARTERY BYPASS GRAFT N/A 02/28/2016   Procedure: CORONARY ARTERY BYPASS GRAFTING x two using left internal mammary artery and right leg greater saphenous vein harvested endoscopically;  Surgeon: Gaye Pollack, MD;  Location: [facility];  Service: Open Heart Surgery;  Laterality: N/A;   KNEE SURGERY  1995   Micro   TEE WITHOUT CARDIOVERSION N/A 02/28/2016   Procedure: TRANSESOPHAGEAL ECHOCARDIOGRAM (TEE);  Surgeon: Gaye Pollack, MD;  Location: [facility];  Service: Open Heart Surgery;  Laterality: N/A;   UPPER GASTROINTESTINAL ENDOSCOPY  08/2010   RMR: GERD benign biopsy   VASECTOMY       Allergies  Allergen Reactions   Ambien [Zolpidem Tartrate] Other (See Comments)    Caused memory problems   Other     Medication for insomnia   Simvastatin     REACTION: arm numbness Patient currently on this medication (03/2012).      Family History  Problem Relation Age of Onset   Lymphoma Mother    Cancer Mother    Ulcers Father    Cancer Father    Ulcers Paternal Grandfather        Stomach ulcer, resection   Heart attack Other    Diabetes Other      Social History Thomas [name] reports that he has never smoked. He has never used smokeless tobacco. Thomas [name] reports current alcohol use of about 2.0 standard drinks per week.   Review of Systems CONSTITUTIONAL: No weight loss, fever, chills, weakness or fatigue.  HEENT: Eyes: No visual loss, blurred vision, double vision or yellow sclerae.No hearing loss, sneezing, congestion, runny nose or sore throat.  SKIN: No rash or itching.  CARDIOVASCULAR: per hpi RESPIRATORY: No shortness of breath, cough or sputum.  GASTROINTESTINAL: No anorexia, nausea, vomiting or diarrhea. No abdominal pain or blood.   GENITOURINARY: No burning on urination, no polyuria NEUROLOGICAL: No headache, dizziness, syncope, paralysis, ataxia, numbness or tingling in the extremities. No change in bowel or bladder control.  MUSCULOSKELETAL: No muscle, back pain, joint pain or stiffness.  LYMPHATICS: No enlarged nodes. No history of splenectomy.  PSYCHIATRIC: No history of depression or anxiety.  ENDOCRINOLOGIC: No reports of sweating, cold or heat intolerance. No polyuria or polydipsia.  Marland Kitchen  Physical Examination Today's Vitals   03/18/21 1103  BP: 140/90  Pulse: (!) 56  SpO2: 98%  Weight: 205 lb 6.4 oz (93.2 kg)  Height: 5\' 9"  (1.753 m)   Body mass index is 30.33 kg/m.  Gen: resting comfortably, no acute distress HEENT: no scleral icterus, pupils equal round and reactive, no palptable cervical adenopathy,  CV: RRR, no m/r/g. Elevated JVD Resp: Clear to auscultation bilaterally GI: abdomen is soft, non-tender, non-distended, normal bowel sounds, no hepatosplenomegaly MSK: extremities are warm, 1+ bilateral LE edema Skin: warm, no rash Neuro:  no focal deficits Psych: appropriate affect   Diagnostic Studies  10/2019 nuclear stress There was no ST segment deviation noted during stress. Findings consistent with prior inferior myocardial infarction, there is no current ischemia. This is an intermediate risk study. Risk based on decreased LVEF, there is no current myocardium at jeopardy. Consider correlating LVEF with echo. The left ventricular ejection fraction is mildly decreased (44%).   07/2020 echo 1. Left ventricular ejection fraction, by estimation, is 60 to 65%. The  left ventricle has normal function. The left ventricle has no regional  wall motion abnormalities. Left ventricular diastolic parameters are  consistent with Grade II diastolic  dysfunction (pseudonormalization).   2. Right ventricular systolic function is normal. The right ventricular  size is normal. There is mildly elevated  pulmonary artery systolic  pressure.   3. Left atrial size was mild to moderately dilated.   4. The mitral valve is normal in structure. Mild mitral valve  regurgitation.   5. The aortic valve is normal in structure. Aortic valve regurgitation is  not visualized.   6. The inferior vena cava is dilated in size with >50% respiratory  variability, suggesting right atrial pressure of 8 mmHg.    Assessment and Plan   1. CAD - chronic nonspecific chest pains Stress test last year was benign - continue current meds, if progression of symptoms would consider cath.   2. Chronic diastolic HF - evidence of fluid overload - will change lasix to 20mg  daily, may take total of 60mg  on days of significnat swelling - monitor his DOE and chest pains with more regular diuresis. If symptosm contineu to progress would consider RHC/LHC    3. HTN -elevated but may improve with diuresis alone, monitor at this time with lasix change  4. Hyperlipidemia - request labs from pcp, continue rosuvastatin   F/u 3 months     Arnoldo Lenis, M.D.

## 2021-03-18 NOTE — Patient Instructions (Addendum)
Medication Instructions:  Your physician recommends that you continue on your current medications as directed. Please refer to the Current Medication list given to you today.  LASIX- Take 20 mg daily. May take 60 mg as needed for SEVERE SWELLING  *If you need a refill on your cardiac medications before your next appointment, please call your pharmacy*   Lab Work: We have requested your lab work from your primary care provider If you have labs (blood work) drawn today and your tests are completely normal, you will receive your results only by: [place] (if you have MyChart) OR A paper copy in the mail If you have any lab test that is abnormal or we need to change your treatment, we will call you to review the results.   Testing/Procedures: None   Follow-Up: At [facility], you and your health needs are our priority.  As part of our continuing mission to provide you with exceptional heart care, we have created designated Provider Care Teams.  These Care Teams include your primary Cardiologist (physician) and Advanced Practice Providers (APPs -  Physician Assistants and Nurse Practitioners) who all work together to provide you with the care you need, when you need it.  We recommend signing up for the patient portal called "MyChart".  Sign up information is provided on this After Visit Summary.  MyChart is used to connect with patients for Virtual Visits (Telemedicine).  Patients are able to view lab/test results, encounter notes, upcoming appointments, etc.  Non-urgent messages can be sent to your provider as well.   To learn more about what you can do with MyChart, go to [email or web address].    Your next appointment:   3 month(s)  The format for your next appointment:   In Person  Provider:   Carlyle Dolly, MD    Other Instructions

## 2021-03-30 DIAGNOSIS — I10 Essential (primary) hypertension: Secondary | ICD-10-CM | POA: Diagnosis not present

## 2021-03-30 DIAGNOSIS — E1165 Type 2 diabetes mellitus with hyperglycemia: Secondary | ICD-10-CM | POA: Diagnosis not present

## 2021-04-01 ENCOUNTER — Ambulatory Visit: Payer: Medicare HMO | Admitting: Urology

## 2021-05-10 DIAGNOSIS — H25813 Combined forms of age-related cataract, bilateral: Secondary | ICD-10-CM | POA: Diagnosis not present

## 2021-05-10 DIAGNOSIS — H11442 Conjunctival cysts, left eye: Secondary | ICD-10-CM | POA: Diagnosis not present

## 2021-05-10 DIAGNOSIS — Z7984 Long term (current) use of oral hypoglycemic drugs: Secondary | ICD-10-CM | POA: Diagnosis not present

## 2021-05-10 DIAGNOSIS — H524 Presbyopia: Secondary | ICD-10-CM | POA: Diagnosis not present

## 2021-05-19 DIAGNOSIS — E1122 Type 2 diabetes mellitus with diabetic chronic kidney disease: Secondary | ICD-10-CM | POA: Diagnosis not present

## 2021-05-19 DIAGNOSIS — Z0100 Encounter for examination of eyes and vision without abnormal findings: Secondary | ICD-10-CM | POA: Diagnosis not present

## 2021-05-19 DIAGNOSIS — E782 Mixed hyperlipidemia: Secondary | ICD-10-CM | POA: Diagnosis not present

## 2021-05-23 ENCOUNTER — Other Ambulatory Visit: Payer: Self-pay

## 2021-05-23 ENCOUNTER — Ambulatory Visit: Payer: Medicare HMO | Admitting: Physician Assistant

## 2021-05-23 ENCOUNTER — Encounter: Payer: Self-pay | Admitting: Physician Assistant

## 2021-05-23 VITALS — BP 138/70 | HR 56 | Wt 211.0 lb

## 2021-05-23 DIAGNOSIS — E782 Mixed hyperlipidemia: Secondary | ICD-10-CM | POA: Diagnosis not present

## 2021-05-23 DIAGNOSIS — R339 Retention of urine, unspecified: Secondary | ICD-10-CM | POA: Diagnosis not present

## 2021-05-23 DIAGNOSIS — K219 Gastro-esophageal reflux disease without esophagitis: Secondary | ICD-10-CM | POA: Diagnosis not present

## 2021-05-23 DIAGNOSIS — R945 Abnormal results of liver function studies: Secondary | ICD-10-CM | POA: Diagnosis not present

## 2021-05-23 DIAGNOSIS — N401 Enlarged prostate with lower urinary tract symptoms: Secondary | ICD-10-CM

## 2021-05-23 DIAGNOSIS — N138 Other obstructive and reflux uropathy: Secondary | ICD-10-CM

## 2021-05-23 DIAGNOSIS — N481 Balanitis: Secondary | ICD-10-CM | POA: Diagnosis not present

## 2021-05-23 DIAGNOSIS — E669 Obesity, unspecified: Secondary | ICD-10-CM | POA: Diagnosis not present

## 2021-05-23 DIAGNOSIS — I10 Essential (primary) hypertension: Secondary | ICD-10-CM | POA: Diagnosis not present

## 2021-05-23 DIAGNOSIS — N281 Cyst of kidney, acquired: Secondary | ICD-10-CM

## 2021-05-23 DIAGNOSIS — E1122 Type 2 diabetes mellitus with diabetic chronic kidney disease: Secondary | ICD-10-CM | POA: Diagnosis not present

## 2021-05-23 DIAGNOSIS — R350 Frequency of micturition: Secondary | ICD-10-CM | POA: Diagnosis not present

## 2021-05-23 DIAGNOSIS — I2510 Atherosclerotic heart disease of native coronary artery without angina pectoris: Secondary | ICD-10-CM | POA: Diagnosis not present

## 2021-05-23 DIAGNOSIS — M5451 Vertebrogenic low back pain: Secondary | ICD-10-CM | POA: Diagnosis not present

## 2021-05-23 DIAGNOSIS — R600 Localized edema: Secondary | ICD-10-CM | POA: Diagnosis not present

## 2021-05-23 LAB — URINALYSIS, ROUTINE W REFLEX MICROSCOPIC
Bilirubin, UA: NEGATIVE
Ketones, UA: NEGATIVE
Leukocytes,UA: NEGATIVE
Nitrite, UA: NEGATIVE
Protein,UA: NEGATIVE
RBC, UA: NEGATIVE
Specific Gravity, UA: <1.005 — ABNORMAL LOW (ref 1.005–1.030)
Urobilinogen, Ur: 0.2 mg/dL (ref 0.2–1.0)
pH, UA: 6 (ref 5.0–7.5)

## 2021-05-23 MED ORDER — CLOTRIMAZOLE-BETAMETHASONE 1-0.05 % EX CREA: 1.0000 "application " | TOPICAL_CREAM | Freq: Two times a day (BID) | CUTANEOUS | 3 refills | Status: DC

## 2021-05-23 NOTE — Progress Notes (Signed)
Urological Symptom Review  Patient is experiencing the following symptoms: Hard to postpone urination Get up at night to urinate   Review of Systems  Gastrointestinal (upper)  : Negative for upper GI symptoms  Gastrointestinal (lower) : Negative for lower GI symptoms  Constitutional : Negative for symptoms  Skin: Negative for skin symptoms  Eyes: Negative for eye symptoms  Ear/Nose/Throat : Sinus problems  Hematologic/Lymphatic: Negative for Hematologic/Lymphatic symptoms  Cardiovascular : Chest pain  Respiratory : Shortness of breath  Endocrine: Excessive thirst  Musculoskeletal: Back pain  Neurological: Negative for neurological symptoms  Psychologic: Negative for psychiatric symptoms

## 2021-05-23 NOTE — Progress Notes (Signed)
05/23/2021 3:06 PM   [name] 1950-08-14 [id number]  Referring provider: Celene Squibb, MD [address] [place],  [place] [address]  No chief complaint on file.   HPI: 10/01/20 Mr Mahany is a 71yo here for followup for BPH and nocturia. IPSS 15 QOL 3. He takes uroxatral intermittently which does help his LUTS. He is on jardiance which worsens his LUTS. He takes lasix prn for lower extremity edema. He has issues with intermittent balanitis which he uses clotrimazole.   05/23/21 [name] is a 71 year old with a history of BPH and nocturia who presents for recheck of his symptoms after beginning Uroxatrol.  He reports his symptoms improved significantly after starting the medication but admits that he has been inconsistent with taking it and now continues to have urgency and frequency.  Uroxatrol is also extremely expensive for him.  He just refilled his prescription this week.  Balanitis is still occasional, but improved and his last A1C improved to 6.1. He continues to have edema and takes Lasix. IPPS 16 QOL 4 PVR 36 UA clear  PMH: Past Medical History:  Diagnosis Date   Acid reflux    Adenomatous colon polyp 08/2010   Anxiety    Arthritis    Chronic back pain    Coronary artery disease    Diabetes mellitus without complication (HCC)    GERD (gastroesophageal reflux disease)    Gout    Hiatal hernia    High cholesterol    HTN (hypertension)    Hyperlipidemia    Renal cyst    Large Left   Restless legs    Sleep apnea    uses cpap    Surgical History: Past Surgical History:  Procedure Laterality Date   APPENDECTOMY  1967   CARDIAC CATHETERIZATION N/A 01/31/2016   Procedure: Left Heart Cath and Coronary Angiography;  Surgeon: Belva Crome, MD;  Location: [facility];  Service: Cardiovascular;  Laterality: N/A;   CARDIOVASCULAR STRESS TEST  2004   Treadmill Stress Test   CARDIOVASCULAR STRESS TEST  2010   CHOLECYSTECTOMY  07/2006   COLONOSCOPY  08/2010    RMR: Cecal tubular adenoma removed, otherwise normal exam. next tcs 08/2015   CORONARY ARTERY BYPASS GRAFT N/A 02/28/2016   Procedure: CORONARY ARTERY BYPASS GRAFTING x two using left internal mammary artery and right leg greater saphenous vein harvested endoscopically;  Surgeon: Gaye Pollack, MD;  Location: [facility];  Service: Open Heart Surgery;  Laterality: N/A;   KNEE SURGERY  1995   Micro   TEE WITHOUT CARDIOVERSION N/A 02/28/2016   Procedure: TRANSESOPHAGEAL ECHOCARDIOGRAM (TEE);  Surgeon: Gaye Pollack, MD;  Location: [facility];  Service: Open Heart Surgery;  Laterality: N/A;   UPPER GASTROINTESTINAL ENDOSCOPY  08/2010   RMR: GERD benign biopsy   VASECTOMY      Home Medications:  Allergies as of 05/23/2021       Reactions   Ambien [zolpidem Tartrate] Other (See Comments)   Caused memory problems   Other    Medication for insomnia   Simvastatin    REACTION: arm numbness Patient currently on this medication (03/2012).        Medication List        Accurate as of May 23, 2021  3:06 PM. If you have any questions, ask your nurse or doctor.          alfuzosin 10 MG 24 hr tablet Commonly known as: UROXATRAL Take 1 tablet (10 mg  total) by mouth at bedtime.   amLODipine 10 MG tablet Commonly known as: NORVASC Take 1 tablet (10 mg total) daily by mouth.   aspirin EC 81 MG tablet Take 81 mg daily by mouth.   clotrimazole-betamethasone cream Commonly known as: Lotrisone Apply 1 application topically 2 (two) times daily.   furosemide 40 MG tablet Commonly known as: LASIX TAKE 1.5 TABLETS (60 MG TOTAL) BY MOUTH AS NEEDED.   gabapentin 300 MG capsule Commonly known as: NEURONTIN Take 300 mg by mouth at bedtime.   isosorbide mononitrate 30 MG 24 hr tablet Commonly known as: IMDUR TAKE 1 TABLET BY MOUTH 2 TIMES DAILY.   lisinopril 10 MG tablet Commonly known as: ZESTRIL TAKE 1 TABLET BY MOUTH EVERY DAY   meloxicam 7.5 MG tablet Commonly known as: MOBIC Take  7.5 mg by mouth 2 (two) times daily.   metoprolol succinate 25 MG 24 hr tablet Commonly known as: TOPROL-XL TAKE 1/2 TABLET BY MOUTH EVERY DAY   mometasone 50 MCG/ACT nasal spray Commonly known as: NASONEX Place 2 sprays into the nose daily as needed (for nasal congestion).   nitroGLYCERIN 0.4 MG SL tablet Commonly known as: NITROSTAT Place 1 tablet (0.4 mg total) under the tongue every 5 (five) minutes as needed for chest pain.   pantoprazole 40 MG tablet Commonly known as: PROTONIX Take 40 mg by mouth at bedtime.   rosuvastatin 10 MG tablet Commonly known as: CRESTOR Take 10 mg by mouth daily.   Synjardy XR 01-999 MG Tb24 Generic drug: Empagliflozin-metFORMIN HCl ER Take 10 mg by mouth daily. What changed: Another medication with the same name was removed. Continue taking this medication, and follow the directions you see here. Changed by: Reynaldo Minium, PA-C   triamterene-hydrochlorothiazide 37.5-25 MG capsule Commonly known as: DYAZIDE Take 1 capsule by mouth every morning.        Allergies:  Allergies  Allergen Reactions   Ambien [Zolpidem Tartrate] Other (See Comments)    Caused memory problems   Other     Medication for insomnia   Simvastatin     REACTION: arm numbness Patient currently on this medication (03/2012).    Family History: Family History  Problem Relation Age of Onset   Lymphoma Mother    Cancer Mother    Ulcers Father    Cancer Father    Ulcers Paternal Grandfather        Stomach ulcer, resection   Heart attack Other    Diabetes Other     Social History:  reports that he has never smoked. He has never used smokeless tobacco. He reports current alcohol use of about 2.0 standard drinks per week. He reports that he does not use drugs.  ROS: All other review of systems were reviewed and are negative except what is noted above in HPI  Physical Exam: BP 138/70    Pulse (!) 56    Wt 211 lb (95.7 kg)    BMI 31.16 kg/m    Constitutional:  Alert and oriented, No acute distress. HEENT: [place] AT, moist mucus membranes.  Trachea midline, no masses. Cardiovascular: No clubbing, cyanosis, or edema. Respiratory: Normal respiratory effort, no increased work of breathing. GI: Abdomen is soft, nontender, nondistended, no abdominal masses GU: No CVA tenderness. Circumcised phallus. No masses/lesions on penis, testis, scrotum. Prostate 40g smooth no nodules no induration.  Lymph: No cervical or inguinal lymphadenopathy. Skin: No rashes, bruises or suspicious lesions. Neurologic: Grossly intact, no focal deficits, moving all 4 extremities. Psychiatric: Normal  mood and affect.  Laboratory Data: Lab Results  Component Value Date   WBC 10.1 03/02/2016   HGB 10.5 (L) 03/02/2016   HCT 32.6 (L) 03/02/2016   MCV 82.7 03/02/2016   PLT 178 03/02/2016    Lab Results  Component Value Date   CREATININE 0.87 03/02/2016    Lab Results  Component Value Date   PSA 3.42 06/20/2011   PSA 2.79 01/16/2009    Lab Results  Component Value Date   TESTOSTERONE 411.33 01/16/2009    Lab Results  Component Value Date   HGBA1C 6.4 (H) 02/24/2016    Urinalysis    Component Value Date/Time   COLORURINE YELLOW 02/24/2016 1529   APPEARANCEUR Clear 10/01/2020 1344   LABSPEC 1.016 02/24/2016 1529   PHURINE 7.0 02/24/2016 1529   GLUCOSEU 3+ (A) 10/01/2020 1344   HGBUR NEGATIVE 02/24/2016 1529   HGBUR negative 01/15/2009 1046   BILIRUBINUR Negative 10/01/2020 1344   KETONESUR NEGATIVE 02/24/2016 1529   PROTEINUR Trace (A) 10/01/2020 1344   PROTEINUR NEGATIVE 02/24/2016 1529   UROBILINOGEN negative (A) 08/20/2019 1028   UROBILINOGEN 1.0 01/15/2009 1046   NITRITE Negative 10/01/2020 1344   NITRITE NEGATIVE 02/24/2016 1529   LEUKOCYTESUR Negative 10/01/2020 1344    Lab Results  Component Value Date   LABMICR See below: 10/01/2020   WBCUA None seen 10/01/2020   LABEPIT None seen 10/01/2020   MUCUS Present 10/01/2020    BACTERIA None seen 10/01/2020    Pertinent Imaging:  No results found for this or any previous visit.  No results found for this or any previous visit.  No results found for this or any previous visit.  No results found for this or any previous visit.  Results for orders placed during the hospital encounter of 06/30/08  US Renal  Narrative Clinical Data: Renal cyst on abdominal CT  RENAL/URINARY TRACT ULTRASOUND  Technique:  Complete ultrasound examination of the urinary tract was performed including evaluation of the kidneys, renal collecting systems, and urinary bladder.  Comparison: None Correlation:  CT abdomen 01/22/2008  Findings: Kidneys measure 12.5 cm length right and 11.2 cm length left. Normal renal cortical thickness and echogenicity bilaterally. Bilobed cyst identified at upper pole left kidney, 9.8 x 6.5 x 8.9 cm. No discrete mural nodularity, septation, or solid mass. Nonshadowing echogenic foci at inferior pole and mid right kidney. No definite shadowing renal calcifications. No perinephric fluid collections. Bladder unremarkable.  IMPRESSION: Bilobed cyst at upper pole left kidney 9.8 x 6.5 x 8.9 cm, without evidence of solid mass, mural nodularity or septation. Nonspecific tiny echogenic foci in parenchyma of right kidney, of uncertain etiology and significance.  Provider: Mertie Clause  No results found for this or any previous visit.  No results found for this or any previous visit.  No results found for this or any previous visit.   Assessment & Plan:    1. Incomplete emptying of bladder - BLADDER SCAN AMB NON-IMAGING - Urinalysis, Routine w reflex microscopic  2. Benign prostatic hyperplasia with urinary obstruction Suggested changing Uroxatrol, but patient would like to continue taking it but on a regular basis because it helped initially a significant amount.  He will call if he decides to change medications.  Timed voiding  discussed as well as avoiding fluid intake prior to bed.  Follow-up in 6 months for PVR and recheck.  3. Balanitis Clotrimazole refilled to use as needed.  He is maintaining much better control of his glucose levels and is encouraged to continue  good hygiene. - clotrimazole-betamethasone (LOTRISONE) cream; Apply 1 application topically 2 (two) times daily.    No follow-ups on file.  [place], Berneice Heinrich, PA-C  [facility] [place]

## 2021-05-23 NOTE — Progress Notes (Signed)
post void residual=36 ml

## 2021-06-06 ENCOUNTER — Telehealth: Payer: Self-pay | Admitting: Cardiology

## 2021-06-06 NOTE — Telephone Encounter (Signed)
Patient informed appointment scheduled

## 2021-06-06 NOTE — Telephone Encounter (Signed)
New Message:    Patient said Dr Harl Bowie told him if he have problems with breathing  and chest discomfort before his appointment to call. He said he do not think he can wait until 06-23-21.   Pt c/o Shortness Of Breath: STAT if SOB developed within the last 24 hours or pt is noticeably SOB on the phone  1. Are you currently SOB (can you hear that pt is SOB on the phone)?  not at this exact time  2. How long have you been experiencing SOB? The last couple of week seem to be getting worse  3. Are you SOB when sitting or when up moving around? Most when moving around , sometimes sitting  4. Are you currently experiencing any other symptoms? Tightness in his chest- not at thtis exact time- pattient wants  to be seen

## 2021-06-06 NOTE — Telephone Encounter (Signed)
Looks like there is a 920 open this Delene Ruffini MD

## 2021-06-08 ENCOUNTER — Encounter: Payer: Self-pay | Admitting: Cardiology

## 2021-06-08 ENCOUNTER — Ambulatory Visit: Payer: Medicare HMO | Admitting: Cardiology

## 2021-06-08 VITALS — BP 140/84 | HR 64 | Ht 69.0 in | Wt 217.2 lb

## 2021-06-08 DIAGNOSIS — I5033 Acute on chronic diastolic (congestive) heart failure: Secondary | ICD-10-CM | POA: Diagnosis not present

## 2021-06-08 DIAGNOSIS — R0602 Shortness of breath: Secondary | ICD-10-CM | POA: Diagnosis not present

## 2021-06-08 MED ORDER — FUROSEMIDE 40 MG PO TABS: 60.0000 mg | ORAL_TABLET | Freq: Two times a day (BID) | ORAL | 1 refills | Status: DC

## 2021-06-08 NOTE — Patient Instructions (Addendum)
Medication Instructions:  Your physician has recommended you make the following change in your medication:  Increase furosemide to 60 mg twice daily Continue other medications the same  Labwork: none  Testing/Procedures: Your physician has requested that you have an echocardiogram. Echocardiography is a painless test that uses sound waves to create images of your heart. It provides your doctor with information about the size and shape of your heart and how well your hearts chambers and valves are working. This procedure takes approximately one hour. There are no restrictions for this procedure.  Follow-Up: Your physician recommends that you schedule a follow-up appointment in: 3 months  Any Other Special Instructions Will Be Listed Below (If Applicable). Please call our office Friday 06/10/2021 or send mychart message to update [place] on your weights, swelling and breathing.  If you need a refill on your cardiac medications before your next appointment, please call your pharmacy.

## 2021-06-08 NOTE — Progress Notes (Signed)
Clinical Summary Thomas [name] is a 71 y.o.maleseen today for follow up of the following medical problems. This is a focused visit on recent issues with SOB and leg swelling.     Acute on chronic diastolic HF Recent LE edema -weight 211 lbs-->217 lbs today - DOE with exertion, for example walking up stairs. DOE just walking to mailbox - some orthopnea. Abdominal distension.  - some recent chest tightness, can occur at or with exertion. Constant lasting throughout the day.  - home weights have trended up.  - taking lasix 60mg  daily, sometimes up to 80mg  .          Past Medical History:  Diagnosis Date   Acid reflux    Adenomatous colon polyp 08/2010   Anxiety    Arthritis    Chronic back pain    Coronary artery disease    Diabetes mellitus without complication (HCC)    GERD (gastroesophageal reflux disease)    Gout    Hiatal hernia    High cholesterol    HTN (hypertension)    Hyperlipidemia    Renal cyst    Large Left   Restless legs    Sleep apnea    uses cpap     Allergies  Allergen Reactions   Ambien [Zolpidem Tartrate] Other (See Comments)    Caused memory problems   Other     Medication for insomnia   Simvastatin     REACTION: arm numbness Patient currently on this medication (03/2012).     Current Outpatient Medications  Medication Sig Dispense Refill   alfuzosin (UROXATRAL) 10 MG 24 hr tablet Take 1 tablet (10 mg total) by mouth at bedtime. 90 tablet 3   amLODipine (NORVASC) 10 MG tablet Take 1 tablet (10 mg total) daily by mouth. 180 tablet 3   aspirin EC 81 MG tablet Take 81 mg daily by mouth.     clotrimazole-betamethasone (LOTRISONE) cream Apply 1 application topically 2 (two) times daily. 30 g 3   Empagliflozin-metFORMIN HCl ER (SYNJARDY XR) 01-999 MG TB24 Take 10 mg by mouth daily.     furosemide (LASIX) 40 MG tablet TAKE 1.5 TABLETS (60 MG TOTAL) BY MOUTH AS NEEDED. (Patient not taking: Reported on 03/18/2021) 135 tablet 3   gabapentin  (NEURONTIN) 300 MG capsule Take 300 mg by mouth at bedtime.     isosorbide mononitrate (IMDUR) 30 MG 24 hr tablet TAKE 1 TABLET BY MOUTH 2 TIMES DAILY. 180 tablet 1   lisinopril (ZESTRIL) 10 MG tablet TAKE 1 TABLET BY MOUTH EVERY DAY 90 tablet 3   meloxicam (MOBIC) 7.5 MG tablet Take 7.5 mg by mouth 2 (two) times daily.     metoprolol succinate (TOPROL-XL) 25 MG 24 hr tablet TAKE 1/2 TABLET BY MOUTH EVERY DAY 45 tablet 2   mometasone (NASONEX) 50 MCG/ACT nasal spray Place 2 sprays into the nose daily as needed (for nasal congestion).     nitroGLYCERIN (NITROSTAT) 0.4 MG SL tablet Place 1 tablet (0.4 mg total) under the tongue every 5 (five) minutes as needed for chest pain. 25 tablet 3   pantoprazole (PROTONIX) 40 MG tablet Take 40 mg by mouth at bedtime.      rosuvastatin (CRESTOR) 10 MG tablet Take 10 mg by mouth daily.     triamterene-hydrochlorothiazide (DYAZIDE) 37.5-25 MG capsule Take 1 capsule by mouth every morning.      No current facility-administered medications for this visit.     Past Surgical History:  Procedure Laterality Date  APPENDECTOMY  1967   CARDIAC CATHETERIZATION N/A 01/31/2016   Procedure: Left Heart Cath and Coronary Angiography;  Surgeon: Belva Crome, MD;  Location: [facility];  Service: Cardiovascular;  Laterality: N/A;   CARDIOVASCULAR STRESS TEST  2004   Treadmill Stress Test   CARDIOVASCULAR STRESS TEST  2010   CHOLECYSTECTOMY  07/2006   COLONOSCOPY  08/2010   RMR: Cecal tubular adenoma removed, otherwise normal exam. next tcs 08/2015   CORONARY ARTERY BYPASS GRAFT N/A 02/28/2016   Procedure: CORONARY ARTERY BYPASS GRAFTING x two using left internal mammary artery and right leg greater saphenous vein harvested endoscopically;  Surgeon: Gaye Pollack, MD;  Location: [facility];  Service: Open Heart Surgery;  Laterality: N/A;   KNEE SURGERY  1995   Micro   TEE WITHOUT CARDIOVERSION N/A 02/28/2016   Procedure: TRANSESOPHAGEAL ECHOCARDIOGRAM (TEE);   Surgeon: Gaye Pollack, MD;  Location: [facility];  Service: Open Heart Surgery;  Laterality: N/A;   UPPER GASTROINTESTINAL ENDOSCOPY  08/2010   RMR: GERD benign biopsy   VASECTOMY       Allergies  Allergen Reactions   Ambien [Zolpidem Tartrate] Other (See Comments)    Caused memory problems   Other     Medication for insomnia   Simvastatin     REACTION: arm numbness Patient currently on this medication (03/2012).      Family History  Problem Relation Age of Onset   Lymphoma Mother    Cancer Mother    Ulcers Father    Cancer Father    Ulcers Paternal Grandfather        Stomach ulcer, resection   Heart attack Other    Diabetes Other      Social History Mr. Spengler reports that he has never smoked. He has never used smokeless tobacco. Mr. Simington reports current alcohol use of about 2.0 standard drinks per week.   Review of Systems CONSTITUTIONAL: No weight loss, fever, chills, weakness or fatigue.  HEENT: Eyes: No visual loss, blurred vision, double vision or yellow sclerae.No hearing loss, sneezing, congestion, runny nose or sore throat.  SKIN: No rash or itching.  CARDIOVASCULAR: per hpi RESPIRATORY: No shortness of breath, cough or sputum.  GASTROINTESTINAL: No anorexia, nausea, vomiting or diarrhea. No abdominal pain or blood.  GENITOURINARY: No burning on urination, no polyuria NEUROLOGICAL: No headache, dizziness, syncope, paralysis, ataxia, numbness or tingling in the extremities. No change in bowel or bladder control.  MUSCULOSKELETAL: No muscle, back pain, joint pain or stiffness.  LYMPHATICS: No enlarged nodes. No history of splenectomy.  PSYCHIATRIC: No history of depression or anxiety.  ENDOCRINOLOGIC: No reports of sweating, cold or heat intolerance. No polyuria or polydipsia.  Marland Kitchen   Physical Examination Today's Vitals   06/08/21 0917  BP: 140/84  Pulse: 64  SpO2: 94%  Weight: 217 lb 3.2 oz (98.5 kg)  Height: 5\' 9"  (1.753 m)   Body mass index is 32.07  kg/m.  Gen: resting comfortably, no acute distress HEENT: no scleral icterus, pupils equal round and reactive, no palptable cervical adenopathy,  CV: RRR, no m/r/g. +JVD Resp: decraesed breath sounds bialteral bases GI: abdomen is soft, non-tender, non-distended, normal bowel sounds, no hepatosplenomegaly MSK: extremities are warm, 2+ bialteral LE edema Skin: warm, no rash Neuro:  no focal deficits Psych: appropriate affect   Diagnostic Studies 10/2019 nuclear stress There was no ST segment deviation noted during stress. Findings consistent with prior inferior myocardial infarction, there is no current ischemia. This is an intermediate  risk study. Risk based on decreased LVEF, there is no current myocardium at jeopardy. Consider correlating LVEF with echo. The left ventricular ejection fraction is mildly decreased (44%).     07/2020 echo 1. Left ventricular ejection fraction, by estimation, is 60 to 65%. The  left ventricle has normal function. The left ventricle has no regional  wall motion abnormalities. Left ventricular diastolic parameters are  consistent with Grade II diastolic  dysfunction (pseudonormalization).   2. Right ventricular systolic function is normal. The right ventricular  size is normal. There is mildly elevated pulmonary artery systolic  pressure.   3. Left atrial size was mild to moderately dilated.   4. The mitral valve is normal in structure. Mild mitral valve  regurgitation.   5. The aortic valve is normal in structure. Aortic valve regurgitation is  not visualized.   6. The inferior vena cava is dilated in size with >50% respiratory  variability, suggesting right atrial pressure of 8 mmHg.     Assessment and Plan   1. Acute on chronic diastolic HF - significant volume overload on exam, clinic weight 211-->217 lbs in 2 weeks - increase lasix to 60mg  bid. He will call Friday to update [place] on home weights, swelling and symptoms. - likely order  BMET/mg/bnp after phone call Friday - given progression of edema repeat echo     Arnoldo Lenis, M.D.,

## 2021-06-10 ENCOUNTER — Encounter: Payer: Self-pay | Admitting: Cardiology

## 2021-06-12 NOTE — Telephone Encounter (Signed)
Can we order a bmet/mg/bnp. I would lower the lasix back to 60mg  daily, may take additional 40mg  later in the day for leg edema, SOB or weight over 208 lbs   Zandra Abts MD

## 2021-06-14 ENCOUNTER — Other Ambulatory Visit: Payer: Self-pay | Admitting: *Deleted

## 2021-06-14 MED ORDER — FUROSEMIDE 40 MG PO TABS: ORAL_TABLET | ORAL | Status: DC

## 2021-06-17 ENCOUNTER — Other Ambulatory Visit: Payer: Self-pay | Admitting: Cardiology

## 2021-06-18 ENCOUNTER — Other Ambulatory Visit: Payer: Self-pay | Admitting: Urology

## 2021-06-23 ENCOUNTER — Ambulatory Visit: Payer: Medicare HMO | Admitting: Cardiology

## 2021-06-27 DIAGNOSIS — G4733 Obstructive sleep apnea (adult) (pediatric): Secondary | ICD-10-CM | POA: Diagnosis not present

## 2021-06-29 ENCOUNTER — Telehealth: Payer: Self-pay | Admitting: *Deleted

## 2021-06-29 DIAGNOSIS — I10 Essential (primary) hypertension: Secondary | ICD-10-CM

## 2021-06-29 DIAGNOSIS — I5033 Acute on chronic diastolic (congestive) heart failure: Secondary | ICD-10-CM

## 2021-06-29 NOTE — Telephone Encounter (Signed)
Per Dr. Harl Bowie -  ?  ?  ?can we order labs (bmet/mg/bnp) ?I would lower the lasix back to 60mg  daily, may take additional 40mg  later in the day for leg edema, SOB or weight over 208 lbs ?  ?What lab do you use ?  ?  ?Thanks, ?  ?G. Evin Chirco, LPN   ?  ?  ?  ?  ? ?This MyChart message has not been read.  ? ?Merlene Laughter, RN routed conversation to You 2 weeks ago  ? ?Arnoldo Lenis, MD  Merlene Laughter, RN 2 weeks ago  ? ?Can we order a bmet/mg/bnp. I would lower the lasix back to 60mg  daily, may take additional 40mg  later in the day for leg edema, SOB or weight over 208 lbs ?  ?  ?Zandra Abts MD  ?  ?  ?Note   ? ?You routed conversation to Arnoldo Lenis, MD 2 weeks ago  ? ?[place] Triage (supporting Harl Bowie Alphonse Guild, MD) 2 weeks ago  ? ?Weight readings as requested  ?217 @ Dr.s office ?213  @12 :30 pm. Wednesday  ?210. @8 :00.  Pm. Wednesday  ?208. @8 :30. Am.  Thursday  ?207. @11 :00 pm. Thursday  ?204. @8 :40. am.   Friday  ?Feeling much better--- thanks Selinda Flavin ?  ? ?

## 2021-07-05 NOTE — Telephone Encounter (Signed)
Left message to return call 

## 2021-07-06 ENCOUNTER — Other Ambulatory Visit: Payer: Self-pay | Admitting: Cardiology

## 2021-07-07 MED ORDER — FUROSEMIDE 40 MG PO TABS: 60.0000 mg | ORAL_TABLET | Freq: Every day | ORAL | Status: DC

## 2021-07-07 NOTE — Telephone Encounter (Signed)
Patient notified and verbalized understanding. 

## 2021-07-08 DIAGNOSIS — J069 Acute upper respiratory infection, unspecified: Secondary | ICD-10-CM | POA: Diagnosis not present

## 2021-07-08 DIAGNOSIS — J329 Chronic sinusitis, unspecified: Secondary | ICD-10-CM | POA: Diagnosis not present

## 2021-07-14 ENCOUNTER — Ambulatory Visit (INDEPENDENT_AMBULATORY_CARE_PROVIDER_SITE_OTHER): Payer: Medicare HMO

## 2021-07-14 DIAGNOSIS — I5033 Acute on chronic diastolic (congestive) heart failure: Secondary | ICD-10-CM | POA: Diagnosis not present

## 2021-07-14 DIAGNOSIS — R0602 Shortness of breath: Secondary | ICD-10-CM | POA: Diagnosis not present

## 2021-07-14 LAB — ECHOCARDIOGRAM COMPLETE
AR max vel: 2.78 cm2
AV Area VTI: 2.96 cm2
AV Area mean vel: 3.03 cm2
AV Mean grad: 3 mmHg
AV Peak grad: 6.6 mmHg
AV Vena cont: 0.57 cm
Ao pk vel: 1.28 m/s
Area-P 1/2: 3.48 cm2
Calc EF: 49.2 %
MV M vel: 5 m/s
MV Peak grad: 100 mmHg
Radius: 0.5 cm
S' Lateral: 4.48 cm
Single Plane A2C EF: 46.6 %
Single Plane A4C EF: 49.5 %

## 2021-07-28 ENCOUNTER — Telehealth: Payer: Self-pay | Admitting: *Deleted

## 2021-07-28 ENCOUNTER — Encounter: Payer: Self-pay | Admitting: *Deleted

## 2021-07-28 NOTE — Telephone Encounter (Signed)
Laurine Blazer, LPN  ?09/05/2255 50:51 PM EDT Back to Top  ?  ?Notified, copy to pcp.   ? ?

## 2021-07-28 NOTE — Telephone Encounter (Signed)
-----   Message from Merlene Laughter, RN sent at 07/20/2021  4:51 PM EDT ----- ? ?----- Message ----- ?From: Arnoldo Lenis, MD ?Sent: 07/20/2021   3:58 PM EDT ?To: Merlene Laughter, RN ? ?Echo does show some decrease in heart pumping function. Can he come in Friday at noon to discuss findings and possible medication changes ? ?Zandra Abts MD ? ?

## 2021-07-30 ENCOUNTER — Other Ambulatory Visit: Payer: Self-pay | Admitting: Cardiology

## 2021-08-02 ENCOUNTER — Encounter: Payer: Self-pay | Admitting: Cardiology

## 2021-08-02 ENCOUNTER — Other Ambulatory Visit: Payer: Self-pay | Admitting: Cardiology

## 2021-08-02 ENCOUNTER — Telehealth: Payer: Self-pay | Admitting: Cardiology

## 2021-08-02 ENCOUNTER — Ambulatory Visit: Payer: Medicare HMO | Admitting: Cardiology

## 2021-08-02 VITALS — BP 130/82 | HR 66 | Ht 69.0 in | Wt 211.2 lb

## 2021-08-02 DIAGNOSIS — I5020 Unspecified systolic (congestive) heart failure: Secondary | ICD-10-CM

## 2021-08-02 DIAGNOSIS — I5022 Chronic systolic (congestive) heart failure: Secondary | ICD-10-CM

## 2021-08-02 MED ORDER — SODIUM CHLORIDE 0.9% FLUSH: 3.0000 mL | Freq: Two times a day (BID) | INTRAVENOUS | Status: AC

## 2021-08-02 MED ORDER — SACUBITRIL-VALSARTAN 24-26 MG PO TABS: 1.0000 | ORAL_TABLET | Freq: Two times a day (BID) | ORAL | 6 refills | Status: DC

## 2021-08-02 NOTE — Telephone Encounter (Signed)
PERCERT: ? ?R/L [facility]   08/09/2021  ?

## 2021-08-02 NOTE — Progress Notes (Signed)
? ? ? ?Clinical Summary ?[name] is a 71 y.o.male seen today for follow up of the following medical problems. This is a focused visit on recent diagnosis of HFrEF.  ? ? ?New diagnosis of HFrEF ?- long history of HFpEF, most recently an echo showed new systolic dysfunction and HFrEF ?07/2020 echo: LVEF 60-65%, no WMAs, grade II DD ?- 06/2021 echo: LVEF 30-35%, grade II dd, D septum, mild RV dysfunction, PASP 44 ? ?- at last visit weight gain 211 to 217 lbs, increased DOE and orthopnea. +abdominal distension ?- significant volume overload on exam last visit, 2+ bilateral LE edema ?- we changed his lasix to '60mg'$  bid. With change very rapid weight loss 217 to 204 in 3 days. We lowered lasix back to '60mg'$  daily. Was to get labs but I don't see they were collected ? ? ? ?- he is on toprol 12.'5mg'$  daily, lisinopril '20mg'$ , jardiance per pcp for DM2 already ?- toprol dosing limited due to fatigue and bradycardia.  ?- Jan labs Cr 0.91. Takes lasix '40mg'$  1-2 tablets daily ? ? ?Other medical issues not addressed this visit ? ? ?1. CAD ?- history of LIMA-LAD and SVG-OM2 CABG 01/2016. LVEF 50-55% by TEE at the time ?- 03/2017 echo LVEF 45-50%. Grade Ii diastolic dysfunction.  ?  ? 08/2017 nuclear stress: PVCs, short runs NSVT. Small mild intensity mid to basal inferolatearl defect fixed most consistent with scar. LVEF 44% ?10/2017 echo LVEF 50-55%, no WMAs ?  ?  ?- metoprolol dosing limited to fatigue and bradycardia, has only been on 12.'5mg'$  daily.  ?  ?10/2019 Lexiscan nuclear stress:no ischemia.  ?07/2020 echo: LVEF 60-65%, grade II dd, normal RV function, mild MR ?  ?  ?- some chest pains at times.  ?- episode 3 weeks ago. Mid to left chest ?- ongoing SOB with activities. DOE at 1/2 block, at grocery store.  ?- some LE edema at times, resolves with lasix prn. Takes about 3 times a week ?  ? ? ?  ?2. Irregular heart beat ?- PVCs noted on stress test ?- higher beta blocker dose causes fatigue, feels good on toprol 12.'5mg'$  daily ?  ?-  some palpitations at times. Overall mild ?  ?  ?3. HTN ?- he is compliant with meds ?  ?4. Hyperlipidemia ?- lipitor caused muscle aches. Simvastatin caused side effects. Currently on crestor '10mg'$  daily, tolerating.  ?- recent labs with pcp ?  ?  ?  ? ? ?Allergies  ?Allergen Reactions  ? Ambien [Zolpidem Tartrate] Other (See Comments)  ?  Caused memory problems  ? Other   ?  Medication for insomnia  ? Simvastatin   ?  REACTION: arm numbness ?Patient currently on this medication (03/2012).  ? ? ? ?Current Outpatient Medications  ?Medication Sig Dispense Refill  ? alfuzosin (UROXATRAL) 10 MG 24 hr tablet TAKE 1 TABLET BY MOUTH EVERYDAY AT BEDTIME 90 tablet 3  ? amLODipine (NORVASC) 10 MG tablet Take 1 tablet (10 mg total) daily by mouth. 180 tablet 3  ? aspirin EC 81 MG tablet Take 81 mg daily by mouth.    ? clotrimazole-betamethasone (LOTRISONE) cream Apply 1 application topically as needed.    ? Empagliflozin-metFORMIN HCl ER (SYNJARDY XR) 01-999 MG TB24 Take 10 mg by mouth daily.    ? furosemide (LASIX) 40 MG tablet TAKE 1.5 TABLETS (60 MG TOTAL) BY MOUTH 2 (TWO) TIMES DAILY. 135 tablet 1  ? gabapentin (NEURONTIN) 300 MG capsule Take 300 mg by mouth at  bedtime.    ? isosorbide mononitrate (IMDUR) 30 MG 24 hr tablet TAKE 1 TABLET BY MOUTH TWICE A DAY 180 tablet 1  ? lisinopril (ZESTRIL) 20 MG tablet Take 20 mg by mouth daily.    ? meloxicam (MOBIC) 7.5 MG tablet Take 7.5 mg by mouth 2 (two) times daily.    ? metoprolol succinate (TOPROL-XL) 25 MG 24 hr tablet TAKE 1/2 TABLET BY MOUTH EVERY DAY 45 tablet 2  ? mometasone (NASONEX) 50 MCG/ACT nasal spray Place 2 sprays into the nose daily as needed (for nasal congestion).    ? nitroGLYCERIN (NITROSTAT) 0.4 MG SL tablet Place 1 tablet (0.4 mg total) under the tongue every 5 (five) minutes as needed for chest pain. 25 tablet 3  ? pantoprazole (PROTONIX) 40 MG tablet Take 40 mg by mouth at bedtime.     ? rosuvastatin (CRESTOR) 10 MG tablet Take 10 mg by mouth daily.    ?  triamterene-hydrochlorothiazide (DYAZIDE) 37.5-25 MG capsule Take 1 capsule by mouth every morning.     ? ?No current facility-administered medications for this visit.  ? ? ? ?Past Surgical History:  ?Procedure Laterality Date  ? APPENDECTOMY  1967  ? CARDIAC CATHETERIZATION N/A 01/31/2016  ? Procedure: Left Heart Cath and Coronary Angiography;  Surgeon: Belva Crome, MD;  Location: [facility];  Service: Cardiovascular;  Laterality: N/A;  ? CARDIOVASCULAR STRESS TEST  2004  ? Treadmill Stress Test  ? CARDIOVASCULAR STRESS TEST  2010  ? CHOLECYSTECTOMY  07/2006  ? COLONOSCOPY  08/2010  ? RMR: Cecal tubular adenoma removed, otherwise normal exam. next tcs 08/2015  ? CORONARY ARTERY BYPASS GRAFT N/A 02/28/2016  ? Procedure: CORONARY ARTERY BYPASS GRAFTING x two using left internal mammary artery and right leg greater saphenous vein harvested endoscopically;  Surgeon: Gaye Pollack, MD;  Location: [facility];  Service: Open Heart Surgery;  Laterality: N/A;  ? [place]  ? Micro  ? TEE WITHOUT CARDIOVERSION N/A 02/28/2016  ? Procedure: TRANSESOPHAGEAL ECHOCARDIOGRAM (TEE);  Surgeon: Gaye Pollack, MD;  Location: [facility];  Service: Open Heart Surgery;  Laterality: N/A;  ? UPPER GASTROINTESTINAL ENDOSCOPY  08/2010  ? RMR: GERD benign biopsy  ? VASECTOMY    ? ? ? ?Allergies  ?Allergen Reactions  ? Ambien [Zolpidem Tartrate] Other (See Comments)  ?  Caused memory problems  ? Other   ?  Medication for insomnia  ? Simvastatin   ?  REACTION: arm numbness ?Patient currently on this medication (03/2012).  ? ? ? ? ?Family History  ?Problem Relation Age of Onset  ? Lymphoma Mother   ? Cancer Mother   ? Ulcers Father   ? Cancer Father   ? Ulcers Paternal Grandfather   ?     Stomach ulcer, resection  ? Heart attack Other   ? Diabetes Other   ? ? ? ?Social History ?[name] reports that he has never smoked. He has never used smokeless tobacco. ?[name] reports current alcohol use of about 2.0 standard drinks per  week. ? ? ?Review of Systems ?CONSTITUTIONAL: No weight loss, fever, chills, weakness or fatigue.  ?HEENT: Eyes: No visual loss, blurred vision, double vision or yellow sclerae.No hearing loss, sneezing, congestion, runny nose or sore throat.  ?SKIN: No rash or itching.  ?CARDIOVASCULAR: per hpi ?RESPIRATORY: per hpi ?GASTROINTESTINAL: No anorexia, nausea, vomiting or diarrhea. No abdominal pain or blood.  ?GENITOURINARY: No burning on urination, no polyuria ?NEUROLOGICAL: No headache, dizziness, syncope, paralysis,  ataxia, numbness or tingling in the extremities. No change in bowel or bladder control.  ?MUSCULOSKELETAL: No muscle, back pain, joint pain or stiffness.  ?LYMPHATICS: No enlarged nodes. No history of splenectomy.  ?PSYCHIATRIC: No history of depression or anxiety.  ?ENDOCRINOLOGIC: No reports of sweating, cold or heat intolerance. No polyuria or polydipsia.  ?. ? ? ?Physical Examination ?Today's Vitals  ? 08/02/21 1409  ?BP: 130/82  ?Pulse: 66  ?SpO2: 98%  ?Weight: 211 lb 3.2 oz (95.8 kg)  ?Height: '5\' 9"'$  (1.753 m)  ? ?Body mass index is 31.19 kg/m?. ? ?Gen: resting comfortably, no acute distress ?HEENT: no scleral icterus, pupils equal round and reactive, no palptable cervical adenopathy,  ?CV: RRR, no m/r/g. Elevated JVD ?Resp: Clear to auscultation bilaterally ?GI: abdomen is soft, non-tender, non-distended, normal bowel sounds, no hepatosplenomegaly ?MSK: extremities are warm, 1+ bilateral LE edema ?Skin: warm, no rash ?Neuro:  no focal deficits ?Psych: appropriate affect ? ? ?Diagnostic Studies ? ?10/2019 nuclear stress ?There was no ST segment deviation noted during stress. ?Findings consistent with prior inferior myocardial infarction, there is no current ischemia. ?This is an intermediate risk study. Risk based on decreased LVEF, there is no current myocardium at jeopardy. Consider correlating LVEF with echo. ?The left ventricular ejection fraction is mildly decreased (44%). ?  ?  ?07/2020 echo ?1.  Left ventricular ejection fraction, by estimation, is 60 to 65%. The  ?left ventricle has normal function. The left ventricle has no regional  ?wall motion abnormalities. Left ventricular diastolic parameters ar

## 2021-08-02 NOTE — Patient Instructions (Signed)
Medication Instructions:  ?Stop Lisinopril ?Wait 48 hours, then start Entresto 24-26 mg tablets ? ?Labwork: ?CBC ?BMET ? ?Testing/Procedures: ?R/L Heart Cath. ? ?Follow-Up: ?Follow up with Dr. Harl Bowie in 3 weeks.  ? ?Any Other Special Instructions Will Be Listed Below (If Applicable). ? ? ? ? ?If you need a refill on your cardiac medications before your next appointment, please call your pharmacy. ? ? ?[facility] ?[place] ?[address] ?[place] [address] ?Dept: [phone] ?Loc: [phone] ? ?[name]  08/02/2021 ? ?You are scheduled for a Cardiac Catheterization on Tuesday, April 11 with Dr. Peter Martinique. ? ?1. Please arrive at the Main Entrance A at [facility]: [place], [place] [address] at 7:00 AM (This time is two hours before your procedure to ensure your preparation). Free valet parking service is available.  ? ?Special note: Every effort is made to have your procedure done on time. Please understand that emergencies sometimes delay scheduled procedures. ? ?2. Diet: Do not eat solid foods after midnight.  You may have clear liquids until 5 AM upon the day of the procedure. ? ?3. Labs: You will need to have blood drawn on ,Friday August 05, 2021 at [facility]. You do not need to be fasting. ? ?4. Medication instructions in preparation for your procedure: ? ? Contrast Allergy: No ? ? ?Hold Dyazide 37.5-25 mg tablets and furosemide 40 mg tablets the morning of your procedure.  ? ? ?Do not take Diabetes Med Synjardy on the day of the procedure and HOLD 48 HOURS AFTER THE PROCEDURE. ? ?On the morning of your procedure, take Aspirin 81 mg tablets and any morning medicines NOT listed above.  You may use sips of water. ? ?5. Plan to go home the same day, you will only stay overnight if medically necessary. ?6. You MUST have a responsible adult to drive you home. ?7. An adult MUST be with you the first 24  hours after you arrive home. ?8. Bring a current list of your medications, and the last time and date medication taken. ?9. Bring ID and current insurance cards. ?10.Please wear clothes that are easy to get on and off and wear slip-on shoes. ? ?Thank you for allowing [place] to care for you! ?  -- [facility] ? ?

## 2021-08-02 NOTE — H&P (View-Only) (Signed)
? ? ? ?Clinical Summary ?Thomas [name] is a 71 y.o.male seen today for follow up of the following medical problems. This is a focused visit on recent diagnosis of HFrEF.  ? ? ?New diagnosis of HFrEF ?- long history of HFpEF, most recently an echo showed new systolic dysfunction and HFrEF ?07/2020 echo: LVEF 60-65%, no WMAs, grade II DD ?- 06/2021 echo: LVEF 30-35%, grade II dd, D septum, mild RV dysfunction, PASP 44 ? ?- at last visit weight gain 211 to 217 lbs, increased DOE and orthopnea. +abdominal distension ?- significant volume overload on exam last visit, 2+ bilateral LE edema ?- we changed his lasix to '60mg'$  bid. With change very rapid weight loss 217 to 204 in 3 days. We lowered lasix back to '60mg'$  daily. Was to get labs but I don't see they were collected ? ? ? ?- he is on toprol 12.'5mg'$  daily, lisinopril '20mg'$ , jardiance per pcp for DM2 already ?- toprol dosing limited due to fatigue and bradycardia.  ?- Jan labs Cr 0.91. Takes lasix '40mg'$  1-2 tablets daily ? ? ?Other medical issues not addressed this visit ? ? ?1. CAD ?- history of LIMA-LAD and SVG-OM2 CABG 01/2016. LVEF 50-55% by TEE at the time ?- 03/2017 echo LVEF 45-50%. Grade Ii diastolic dysfunction.  ?  ? 08/2017 nuclear stress: PVCs, short runs NSVT. Small mild intensity mid to basal inferolatearl defect fixed most consistent with scar. LVEF 44% ?10/2017 echo LVEF 50-55%, no WMAs ?  ?  ?- metoprolol dosing limited to fatigue and bradycardia, has only been on 12.'5mg'$  daily.  ?  ?10/2019 Lexiscan nuclear stress:no ischemia.  ?07/2020 echo: LVEF 60-65%, grade II dd, normal RV function, mild MR ?  ?  ?- some chest pains at times.  ?- episode 3 weeks ago. Mid to left chest ?- ongoing SOB with activities. DOE at 1/2 block, at grocery store.  ?- some LE edema at times, resolves with lasix prn. Takes about 3 times a week ?  ? ? ?  ?2. Irregular heart beat ?- PVCs noted on stress test ?- higher beta blocker dose causes fatigue, feels good on toprol 12.'5mg'$  daily ?  ?-  some palpitations at times. Overall mild ?  ?  ?3. HTN ?- he is compliant with meds ?  ?4. Hyperlipidemia ?- lipitor caused muscle aches. Simvastatin caused side effects. Currently on crestor '10mg'$  daily, tolerating.  ?- recent labs with pcp ?  ?  ?  ? ? ?Allergies  ?Allergen Reactions  ? Ambien [Zolpidem Tartrate] Other (See Comments)  ?  Caused memory problems  ? Other   ?  Medication for insomnia  ? Simvastatin   ?  REACTION: arm numbness ?Patient currently on this medication (03/2012).  ? ? ? ?Current Outpatient Medications  ?Medication Sig Dispense Refill  ? alfuzosin (UROXATRAL) 10 MG 24 hr tablet TAKE 1 TABLET BY MOUTH EVERYDAY AT BEDTIME 90 tablet 3  ? amLODipine (NORVASC) 10 MG tablet Take 1 tablet (10 mg total) daily by mouth. 180 tablet 3  ? aspirin EC 81 MG tablet Take 81 mg daily by mouth.    ? clotrimazole-betamethasone (LOTRISONE) cream Apply 1 application topically as needed.    ? Empagliflozin-metFORMIN HCl ER (SYNJARDY XR) 01-999 MG TB24 Take 10 mg by mouth daily.    ? furosemide (LASIX) 40 MG tablet TAKE 1.5 TABLETS (60 MG TOTAL) BY MOUTH 2 (TWO) TIMES DAILY. 135 tablet 1  ? gabapentin (NEURONTIN) 300 MG capsule Take 300 mg by mouth at  bedtime.    ? isosorbide mononitrate (IMDUR) 30 MG 24 hr tablet TAKE 1 TABLET BY MOUTH TWICE A DAY 180 tablet 1  ? lisinopril (ZESTRIL) 20 MG tablet Take 20 mg by mouth daily.    ? meloxicam (MOBIC) 7.5 MG tablet Take 7.5 mg by mouth 2 (two) times daily.    ? metoprolol succinate (TOPROL-XL) 25 MG 24 hr tablet TAKE 1/2 TABLET BY MOUTH EVERY DAY 45 tablet 2  ? mometasone (NASONEX) 50 MCG/ACT nasal spray Place 2 sprays into the nose daily as needed (for nasal congestion).    ? nitroGLYCERIN (NITROSTAT) 0.4 MG SL tablet Place 1 tablet (0.4 mg total) under the tongue every 5 (five) minutes as needed for chest pain. 25 tablet 3  ? pantoprazole (PROTONIX) 40 MG tablet Take 40 mg by mouth at bedtime.     ? rosuvastatin (CRESTOR) 10 MG tablet Take 10 mg by mouth daily.    ?  triamterene-hydrochlorothiazide (DYAZIDE) 37.5-25 MG capsule Take 1 capsule by mouth every morning.     ? ?No current facility-administered medications for this visit.  ? ? ? ?Past Surgical History:  ?Procedure Laterality Date  ? APPENDECTOMY  1967  ? CARDIAC CATHETERIZATION N/A 01/31/2016  ? Procedure: Left Heart Cath and Coronary Angiography;  Surgeon: Belva Crome, MD;  Location: [facility];  Service: Cardiovascular;  Laterality: N/A;  ? CARDIOVASCULAR STRESS TEST  2004  ? Treadmill Stress Test  ? CARDIOVASCULAR STRESS TEST  2010  ? CHOLECYSTECTOMY  07/2006  ? COLONOSCOPY  08/2010  ? RMR: Cecal tubular adenoma removed, otherwise normal exam. next tcs 08/2015  ? CORONARY ARTERY BYPASS GRAFT N/A 02/28/2016  ? Procedure: CORONARY ARTERY BYPASS GRAFTING x two using left internal mammary artery and right leg greater saphenous vein harvested endoscopically;  Surgeon: Gaye Pollack, MD;  Location: [facility];  Service: Open Heart Surgery;  Laterality: N/A;  ? [place]  ? Micro  ? TEE WITHOUT CARDIOVERSION N/A 02/28/2016  ? Procedure: TRANSESOPHAGEAL ECHOCARDIOGRAM (TEE);  Surgeon: Gaye Pollack, MD;  Location: [facility];  Service: Open Heart Surgery;  Laterality: N/A;  ? UPPER GASTROINTESTINAL ENDOSCOPY  08/2010  ? RMR: GERD benign biopsy  ? VASECTOMY    ? ? ? ?Allergies  ?Allergen Reactions  ? Ambien [Zolpidem Tartrate] Other (See Comments)  ?  Caused memory problems  ? Other   ?  Medication for insomnia  ? Simvastatin   ?  REACTION: arm numbness ?Patient currently on this medication (03/2012).  ? ? ? ? ?Family History  ?Problem Relation Age of Onset  ? Lymphoma Mother   ? Cancer Mother   ? Ulcers Father   ? Cancer Father   ? Ulcers Paternal Grandfather   ?     Stomach ulcer, resection  ? Heart attack Other   ? Diabetes Other   ? ? ? ?Social History ?Mr. Hollick reports that he has never smoked. He has never used smokeless tobacco. ?Mr. Schuh reports current alcohol use of about 2.0 standard drinks per  week. ? ? ?Review of Systems ?CONSTITUTIONAL: No weight loss, fever, chills, weakness or fatigue.  ?HEENT: Eyes: No visual loss, blurred vision, double vision or yellow sclerae.No hearing loss, sneezing, congestion, runny nose or sore throat.  ?SKIN: No rash or itching.  ?CARDIOVASCULAR: per hpi ?RESPIRATORY: per hpi ?GASTROINTESTINAL: No anorexia, nausea, vomiting or diarrhea. No abdominal pain or blood.  ?GENITOURINARY: No burning on urination, no polyuria ?NEUROLOGICAL: No headache, dizziness, syncope, paralysis,  ataxia, numbness or tingling in the extremities. No change in bowel or bladder control.  ?MUSCULOSKELETAL: No muscle, back pain, joint pain or stiffness.  ?LYMPHATICS: No enlarged nodes. No history of splenectomy.  ?PSYCHIATRIC: No history of depression or anxiety.  ?ENDOCRINOLOGIC: No reports of sweating, cold or heat intolerance. No polyuria or polydipsia.  ?. ? ? ?Physical Examination ?Today's Vitals  ? 08/02/21 1409  ?BP: 130/82  ?Pulse: 66  ?SpO2: 98%  ?Weight: 211 lb 3.2 oz (95.8 kg)  ?Height: '5\' 9"'$  (1.753 m)  ? ?Body mass index is 31.19 kg/m?. ? ?Gen: resting comfortably, no acute distress ?HEENT: no scleral icterus, pupils equal round and reactive, no palptable cervical adenopathy,  ?CV: RRR, no m/r/g. Elevated JVD ?Resp: Clear to auscultation bilaterally ?GI: abdomen is soft, non-tender, non-distended, normal bowel sounds, no hepatosplenomegaly ?MSK: extremities are warm, 1+ bilateral LE edema ?Skin: warm, no rash ?Neuro:  no focal deficits ?Psych: appropriate affect ? ? ?Diagnostic Studies ? ?10/2019 nuclear stress ?There was no ST segment deviation noted during stress. ?Findings consistent with prior inferior myocardial infarction, there is no current ischemia. ?This is an intermediate risk study. Risk based on decreased LVEF, there is no current myocardium at jeopardy. Consider correlating LVEF with echo. ?The left ventricular ejection fraction is mildly decreased (44%). ?  ?  ?07/2020 echo ?1.  Left ventricular ejection fraction, by estimation, is 60 to 65%. The  ?left ventricle has normal function. The left ventricle has no regional  ?wall motion abnormalities. Left ventricular diastolic parameters ar

## 2021-08-05 ENCOUNTER — Other Ambulatory Visit (HOSPITAL_COMMUNITY)
Admission: RE | Admit: 2021-08-05 | Discharge: 2021-08-05 | Disposition: A | Discharge status: Home / Self Care | Payer: Medicare HMO | Source: Ambulatory Visit | Attending: Cardiology | Admitting: Cardiology

## 2021-08-05 ENCOUNTER — Telehealth: Payer: Self-pay | Admitting: Cardiology

## 2021-08-05 DIAGNOSIS — I5020 Unspecified systolic (congestive) heart failure: Secondary | ICD-10-CM | POA: Insufficient documentation

## 2021-08-05 LAB — CBC
HCT: 38.1 % — ABNORMAL LOW (ref 39.0–52.0)
Hemoglobin: 11 g/dL — ABNORMAL LOW (ref 13.0–17.0)
MCH: 20.3 pg — ABNORMAL LOW (ref 26.0–34.0)
MCHC: 28.9 g/dL — ABNORMAL LOW (ref 30.0–36.0)
MCV: 70.3 fL — ABNORMAL LOW (ref 80.0–100.0)
Platelets: 332 10*3/uL (ref 150–400)
RBC: 5.42 MIL/uL (ref 4.22–5.81)
RDW: 18.6 % — ABNORMAL HIGH (ref 11.5–15.5)
WBC: 7.8 10*3/uL (ref 4.0–10.5)
nRBC: 0 % (ref 0.0–0.2)

## 2021-08-05 LAB — BASIC METABOLIC PANEL
Anion gap: 6 (ref 5–15)
BUN: 17 mg/dL (ref 8–23)
CO2: 26 mmol/L (ref 22–32)
Calcium: 9 mg/dL (ref 8.9–10.3)
Chloride: 109 mmol/L (ref 98–111)
Creatinine, Ser: 0.94 mg/dL (ref 0.61–1.24)
GFR, Estimated: >60 mL/min (ref >60)
Glucose, Bld: 144 mg/dL — ABNORMAL HIGH (ref 70–99)
Potassium: 3.6 mmol/L (ref 3.5–5.1)
Sodium: 141 mmol/L (ref 135–145)

## 2021-08-05 LAB — MAGNESIUM: Magnesium: 2.1 mg/dL (ref 1.7–2.4)

## 2021-08-05 LAB — BRAIN NATRIURETIC PEPTIDE: B Natriuretic Peptide: 156 pg/mL — ABNORMAL HIGH (ref 0.0–100.0)

## 2021-08-05 NOTE — Telephone Encounter (Signed)
Pt would like to know  if he moved his heart cath procedure when would he need to come in and have labs done... please advise  ?

## 2021-08-05 NOTE — Telephone Encounter (Signed)
Pt decided against moving heart cath. Pt will keep same procedure instructions.  ?

## 2021-08-08 ENCOUNTER — Other Ambulatory Visit: Payer: Self-pay | Admitting: Cardiology

## 2021-08-09 ENCOUNTER — Ambulatory Visit (HOSPITAL_COMMUNITY)
Admission: RE | Disposition: A | Discharge status: Home / Self Care | Payer: Self-pay | Source: Ambulatory Visit | Attending: Cardiology

## 2021-08-09 ENCOUNTER — Ambulatory Visit (HOSPITAL_COMMUNITY)
Admission: RE | Admit: 2021-08-09 | Discharge: 2021-08-09 | Disposition: A | Discharge status: Home / Self Care | Payer: Medicare HMO | Source: Ambulatory Visit | Attending: Cardiology | Admitting: Cardiology

## 2021-08-09 ENCOUNTER — Other Ambulatory Visit: Payer: Self-pay

## 2021-08-09 ENCOUNTER — Encounter (HOSPITAL_COMMUNITY): Payer: Self-pay | Admitting: Cardiology

## 2021-08-09 DIAGNOSIS — I499 Cardiac arrhythmia, unspecified: Secondary | ICD-10-CM | POA: Diagnosis not present

## 2021-08-09 DIAGNOSIS — Z951 Presence of aortocoronary bypass graft: Secondary | ICD-10-CM | POA: Diagnosis not present

## 2021-08-09 DIAGNOSIS — I25810 Atherosclerosis of coronary artery bypass graft(s) without angina pectoris: Secondary | ICD-10-CM | POA: Diagnosis present

## 2021-08-09 DIAGNOSIS — E785 Hyperlipidemia, unspecified: Secondary | ICD-10-CM | POA: Insufficient documentation

## 2021-08-09 DIAGNOSIS — I2510 Atherosclerotic heart disease of native coronary artery without angina pectoris: Secondary | ICD-10-CM | POA: Diagnosis not present

## 2021-08-09 DIAGNOSIS — E119 Type 2 diabetes mellitus without complications: Secondary | ICD-10-CM | POA: Diagnosis not present

## 2021-08-09 DIAGNOSIS — I110 Hypertensive heart disease with heart failure: Secondary | ICD-10-CM | POA: Insufficient documentation

## 2021-08-09 DIAGNOSIS — I272 Pulmonary hypertension, unspecified: Secondary | ICD-10-CM | POA: Diagnosis not present

## 2021-08-09 DIAGNOSIS — Z7984 Long term (current) use of oral hypoglycemic drugs: Secondary | ICD-10-CM | POA: Insufficient documentation

## 2021-08-09 DIAGNOSIS — Z79899 Other long term (current) drug therapy: Secondary | ICD-10-CM | POA: Insufficient documentation

## 2021-08-09 DIAGNOSIS — I5043 Acute on chronic combined systolic (congestive) and diastolic (congestive) heart failure: Secondary | ICD-10-CM | POA: Diagnosis not present

## 2021-08-09 DIAGNOSIS — I5022 Chronic systolic (congestive) heart failure: Secondary | ICD-10-CM

## 2021-08-09 HISTORY — PX: RIGHT/LEFT HEART CATH AND CORONARY/GRAFT ANGIOGRAPHY: CATH118267

## 2021-08-09 LAB — POCT I-STAT 7, (LYTES, BLD GAS, ICA,H+H)
Acid-Base Excess: 0 mmol/L (ref 0.0–2.0)
Acid-base deficit: 2 mmol/L (ref 0.0–2.0)
Bicarbonate: 25.3 mmol/L (ref 20.0–28.0)
Bicarbonate: 22.9 mmol/L (ref 20.0–28.0)
Calcium, Ion: 1.2 mmol/L (ref 1.15–1.40)
Calcium, Ion: 1.19 mmol/L (ref 1.15–1.40)
HCT: 38 % — ABNORMAL LOW (ref 39.0–52.0)
HCT: 37 % — ABNORMAL LOW (ref 39.0–52.0)
Hemoglobin: 12.9 g/dL — ABNORMAL LOW (ref 13.0–17.0)
Hemoglobin: 12.6 g/dL — ABNORMAL LOW (ref 13.0–17.0)
O2 Saturation: 64 %
O2 Saturation: 94 %
Potassium: 3.5 mmol/L (ref 3.5–5.1)
Potassium: 3.4 mmol/L — ABNORMAL LOW (ref 3.5–5.1)
Sodium: 143 mmol/L (ref 135–145)
Sodium: 142 mmol/L (ref 135–145)
TCO2: 27 mmol/L (ref 22–32)
TCO2: 24 mmol/L (ref 22–32)
pCO2 arterial: 44.8 mmHg (ref 32–48)
pCO2 arterial: 39.6 mmHg (ref 32–48)
pH, Arterial: 7.36 (ref 7.35–7.45)
pH, Arterial: 7.371 (ref 7.35–7.45)
pO2, Arterial: 35 mmHg — CL (ref 83–108)
pO2, Arterial: 73 mmHg — ABNORMAL LOW (ref 83–108)

## 2021-08-09 LAB — POCT I-STAT EG7
Acid-Base Excess: 0 mmol/L (ref 0.0–2.0)
Acid-Base Excess: 0 mmol/L (ref 0.0–2.0)
Bicarbonate: 25.5 mmol/L (ref 20.0–28.0)
Bicarbonate: 26.3 mmol/L (ref 20.0–28.0)
Calcium, Ion: 1.14 mmol/L — ABNORMAL LOW (ref 1.15–1.40)
Calcium, Ion: 1.15 mmol/L (ref 1.15–1.40)
HCT: 37 % — ABNORMAL LOW (ref 39.0–52.0)
HCT: 38 % — ABNORMAL LOW (ref 39.0–52.0)
Hemoglobin: 12.6 g/dL — ABNORMAL LOW (ref 13.0–17.0)
Hemoglobin: 12.9 g/dL — ABNORMAL LOW (ref 13.0–17.0)
O2 Saturation: 61 %
O2 Saturation: 64 %
Potassium: 3.4 mmol/L — ABNORMAL LOW (ref 3.5–5.1)
Potassium: 3.5 mmol/L (ref 3.5–5.1)
Sodium: 144 mmol/L (ref 135–145)
Sodium: 143 mmol/L (ref 135–145)
TCO2: 27 mmol/L (ref 22–32)
TCO2: 28 mmol/L (ref 22–32)
pCO2, Ven: 46.2 mmHg (ref 44–60)
pCO2, Ven: 46.5 mmHg (ref 44–60)
pH, Ven: 7.35 (ref 7.25–7.43)
pH, Ven: 7.36 (ref 7.25–7.43)
pO2, Ven: 33 mmHg (ref 32–45)
pO2, Ven: 35 mmHg (ref 32–45)

## 2021-08-09 LAB — GLUCOSE, CAPILLARY
Glucose-Capillary: 112 mg/dL — ABNORMAL HIGH (ref 70–99)
Glucose-Capillary: 108 mg/dL — ABNORMAL HIGH (ref 70–99)

## 2021-08-09 LAB — POCT ACTIVATED CLOTTING TIME
Activated Clotting Time: 137 seconds
Activated Clotting Time: 143 seconds

## 2021-08-09 SURGERY — RIGHT/LEFT HEART CATH AND CORONARY/GRAFT ANGIOGRAPHY
Anesthesia: LOCAL

## 2021-08-09 MED ORDER — FENTANYL CITRATE (PF) 100 MCG/2ML IJ SOLN
INTRAMUSCULAR | Status: AC
  Filled 2021-08-09: qty 2

## 2021-08-09 MED ORDER — SODIUM CHLORIDE 0.9 % IV SOLN: INTRAVENOUS | Status: DC

## 2021-08-09 MED ORDER — VERAPAMIL HCL 2.5 MG/ML IV SOLN
INTRAVENOUS | Status: AC
  Filled 2021-08-09: qty 2

## 2021-08-09 MED ORDER — ASPIRIN 81 MG PO CHEW
81.0000 mg | CHEWABLE_TABLET | ORAL | Status: AC
  Administered 2021-08-09: 81 mg via ORAL
  Filled 2021-08-09: qty 1

## 2021-08-09 MED ORDER — IOHEXOL 350 MG/ML SOLN
INTRAVENOUS | Status: DC | PRN
Start: 2021-08-09 — End: 2021-08-09
  Administered 2021-08-09: 70 mL

## 2021-08-09 MED ORDER — HEPARIN SODIUM (PORCINE) 1000 UNIT/ML IJ SOLN
INTRAMUSCULAR | Status: AC
  Filled 2021-08-09: qty 10

## 2021-08-09 MED ORDER — SODIUM CHLORIDE 0.9% FLUSH
3.0000 mL | INTRAVENOUS | Status: DC | PRN
Start: 2021-08-09 — End: 2021-08-09

## 2021-08-09 MED ORDER — NITROGLYCERIN 1 MG/10 ML FOR IR/CATH LAB
INTRA_ARTERIAL | Status: AC
  Filled 2021-08-09: qty 10

## 2021-08-09 MED ORDER — ONDANSETRON HCL 4 MG/2ML IJ SOLN: 4.0000 mg | Freq: Four times a day (QID) | INTRAMUSCULAR | Status: DC | PRN

## 2021-08-09 MED ORDER — MIDAZOLAM HCL 2 MG/2ML IJ SOLN
INTRAMUSCULAR | Status: DC | PRN
  Administered 2021-08-09: 2 mg via INTRAVENOUS

## 2021-08-09 MED ORDER — FENTANYL CITRATE (PF) 100 MCG/2ML IJ SOLN
INTRAMUSCULAR | Status: DC | PRN
  Administered 2021-08-09: 25 ug via INTRAVENOUS

## 2021-08-09 MED ORDER — SODIUM CHLORIDE 0.9 % IV SOLN: 250.0000 mL | INTRAVENOUS | Status: DC | PRN

## 2021-08-09 MED ORDER — HEPARIN (PORCINE) IN NACL 2-0.9 UNITS/ML
INTRAMUSCULAR | Status: DC | PRN
  Administered 2021-08-09: 10 mL via INTRA_ARTERIAL

## 2021-08-09 MED ORDER — MIDAZOLAM HCL 2 MG/2ML IJ SOLN
INTRAMUSCULAR | Status: AC
  Filled 2021-08-09: qty 2

## 2021-08-09 MED ORDER — HEPARIN (PORCINE) IN NACL 1000-0.9 UT/500ML-% IV SOLN
INTRAVENOUS | Status: DC | PRN
Start: 2021-08-09 — End: 2021-08-09
  Administered 2021-08-09 (×2): 500 mL

## 2021-08-09 MED ORDER — HEPARIN (PORCINE) IN NACL 1000-0.9 UT/500ML-% IV SOLN
INTRAVENOUS | Status: AC
  Filled 2021-08-09: qty 1000

## 2021-08-09 MED ORDER — HYDRALAZINE HCL 20 MG/ML IJ SOLN: 10.0000 mg | INTRAMUSCULAR | Status: DC | PRN

## 2021-08-09 MED ORDER — SODIUM CHLORIDE 0.9 % IV SOLN
250.0000 mL | INTRAVENOUS | Status: DC | PRN
Start: 2021-08-09 — End: 2021-08-09

## 2021-08-09 MED ORDER — HEPARIN SODIUM (PORCINE) 1000 UNIT/ML IJ SOLN
INTRAMUSCULAR | Status: DC | PRN
  Administered 2021-08-09: 5000 [IU] via INTRAVENOUS

## 2021-08-09 MED ORDER — ACETAMINOPHEN 325 MG PO TABS: 650.0000 mg | ORAL_TABLET | ORAL | Status: DC | PRN

## 2021-08-09 MED ORDER — SODIUM CHLORIDE 0.9% FLUSH: 3.0000 mL | Freq: Two times a day (BID) | INTRAVENOUS | Status: DC

## 2021-08-09 MED ORDER — LIDOCAINE HCL (PF) 1 % IJ SOLN
INTRAMUSCULAR | Status: AC
Start: 2021-08-09 — End: ?
  Filled 2021-08-09: qty 30

## 2021-08-09 MED ORDER — ASPIRIN 81 MG PO CHEW: 81.0000 mg | CHEWABLE_TABLET | ORAL | Status: DC

## 2021-08-09 MED ORDER — SODIUM CHLORIDE 0.9% FLUSH: 3.0000 mL | INTRAVENOUS | Status: DC | PRN

## 2021-08-09 MED ORDER — LIDOCAINE HCL (PF) 1 % IJ SOLN
INTRAMUSCULAR | Status: DC | PRN
  Administered 2021-08-09 (×2): 2 mL

## 2021-08-09 SURGICAL SUPPLY — 13 items
CATH BALLN WEDGE 5F 110CM (CATHETERS) ×1 IMPLANT
CATH INFINITI 5FR AL1 (CATHETERS) ×1 IMPLANT
CATH INFINITI 5FR JL4 (CATHETERS) ×1 IMPLANT
CATH INFINITI 6F ANG MULTIPACK (CATHETERS) ×1 IMPLANT
DEVICE RAD COMP TR BAND LRG (VASCULAR PRODUCTS) ×1 IMPLANT
GLIDESHEATH SLEND SS 6F .021 (SHEATH) ×1 IMPLANT
GUIDEWIRE INQWIRE 1.5J.035X260 (WIRE) IMPLANT
INQWIRE 1.5J .035X260CM (WIRE) ×2
KIT HEART LEFT (KITS) ×2 IMPLANT
PACK CARDIAC CATHETERIZATION (CUSTOM PROCEDURE TRAY) ×2 IMPLANT
SHEATH GLIDE SLENDER 4/5FR (SHEATH) ×1 IMPLANT
TRANSDUCER W/STOPCOCK (MISCELLANEOUS) ×2 IMPLANT
TUBING CIL FLEX 10 FLL-RA (TUBING) ×2 IMPLANT

## 2021-08-09 NOTE — Interval H&P Note (Signed)
History and Physical Interval Note: ? ?08/09/2021 ?8:45 AM ? ?Thomas [name]  has presented today for surgery, with the diagnosis of heart failure.  The various methods of treatment have been discussed with the patient and family. After consideration of risks, benefits and other options for treatment, the patient has consented to  Procedure(s): ?RIGHT/LEFT HEART CATH AND CORONARY/GRAFT ANGIOGRAPHY (N/A) as a surgical intervention.  The patient's history has been reviewed, patient examined, no change in status, stable for surgery.  I have reviewed the patient's chart and labs.  Questions were answered to the patient's satisfaction.   ?Cath Lab Visit (complete for each Cath Lab visit) ? ?Clinical Evaluation Leading to the Procedure:  ? ?ACS: No. ? ?Non-ACS:   ? ?Anginal Classification: CCS II ? ?Anti-ischemic medical therapy: Maximal Therapy (2 or more classes of medications) ? ?Non-Invasive Test Results: No non-invasive testing performed ? ?Prior CABG: Previous CABG ? ? ? ? ? ? ? ?Thomas [name] ?08/09/2021 ?8:45 AM ? ? ? ?

## 2021-08-09 NOTE — Discharge Instructions (Signed)

## 2021-08-09 NOTE — Progress Notes (Signed)
Patient and aunt was given discharge instructions. Both verbalized understanding. ?

## 2021-08-10 MED FILL — Nitroglycerin IV Soln 100 MCG/ML in D5W: INTRA_ARTERIAL | Qty: 10 | Status: AC

## 2021-08-19 ENCOUNTER — Encounter: Payer: Self-pay | Admitting: Cardiology

## 2021-08-19 ENCOUNTER — Ambulatory Visit: Payer: Medicare HMO | Admitting: Cardiology

## 2021-08-19 VITALS — BP 124/72 | HR 60 | Ht 69.0 in | Wt 206.0 lb

## 2021-08-19 DIAGNOSIS — I5020 Unspecified systolic (congestive) heart failure: Secondary | ICD-10-CM

## 2021-08-19 DIAGNOSIS — J343 Hypertrophy of nasal turbinates: Secondary | ICD-10-CM | POA: Diagnosis not present

## 2021-08-19 DIAGNOSIS — J342 Deviated nasal septum: Secondary | ICD-10-CM | POA: Diagnosis not present

## 2021-08-19 MED ORDER — SPIRONOLACTONE 25 MG PO TABS
25.0000 mg | ORAL_TABLET | Freq: Every day | ORAL | 3 refills | Status: DC
Start: 2021-08-19 — End: 2022-09-20

## 2021-08-19 NOTE — Patient Instructions (Signed)
Medication Instructions:  ?Stop Dyazide  ?Start Spironolactone 25 mg tablets daily ? ?Follow-Up: ?Follow up with Dr. Harl Bowie in 1 month. ? ?Any Other Special Instructions Will Be Listed Below (If Applicable). ? ? ? ? ?If you need a refill on your cardiac medications before your next appointment, please call your pharmacy. ? ?

## 2021-08-19 NOTE — Progress Notes (Signed)
? ? ? ?Clinical Summary ?Thomas [name] is a 71 y.o.male seen today for follow up of the following medical problems.  ? ?New diagnosis of HFrEF ?- long history of HFpEF, most recently an echo showed new systolic dysfunction and HFrEF ?07/2020 echo: LVEF 60-65%, no WMAs, grade II DD ?- 06/2021 echo: LVEF 30-35%, grade II dd, D septum, mild RV dysfunction, PASP 44 ?  ?- at last visit weight gain 211 to 217 lbs, increased DOE and orthopnea. +abdominal distension ?- significant volume overload on exam last visit, 2+ bilateral LE edema ?- we changed his lasix to '60mg'$  bid. With change very rapid weight loss 217 to 204 in 3 days. We lowered lasix back to '60mg'$  daily. Was to get labs but I don't see they were collected ?  ?  ?  ?- he is on toprol 12.'5mg'$  daily, lisinopril '20mg'$ , jardiance per pcp for DM2 already ?- toprol dosing limited due to fatigue and bradycardia.  ?- Jan labs Cr 0.91. Takes lasix '40mg'$  1-2 tablets daily ?  ? ?07/2021 cath: ostial LM 50%, D1 70%, LCX osital 75%, RCA small vessel mild diffuse disease. LIMA-LAD patent, SVG sequential to LAD and Om1 patent ?LVEDP 22, mean PA 30   PCWP 21, CI 2.87 ? ? ?- last visit started entresto ?- does not tolerate high toprol dosing ?- breathing has improved. Has not had to use lasix, abdominal distension improving.  ?  ? ? ? ? ? ?Other medical issues not addressed this visit ?  ?  ?1. CAD ?- history of LIMA-LAD and SVG-OM2 CABG 01/2016. LVEF 50-55% by TEE at the time ?- 03/2017 echo LVEF 45-50%. Grade Ii diastolic dysfunction.  ?  ? 08/2017 nuclear stress: PVCs, short runs NSVT. Small mild intensity mid to basal inferolatearl defect fixed most consistent with scar. LVEF 44% ?10/2017 echo LVEF 50-55%, no WMAs ?  ?  ?- metoprolol dosing limited to fatigue and bradycardia, has only been on 12.'5mg'$  daily.  ?  ?10/2019 Lexiscan nuclear stress:no ischemia.  ?07/2020 echo: LVEF 60-65%, grade II dd, normal RV function, mild MR ?  ?  ?- some chest pains at times.  ?- episode 3 weeks ago. Mid  to left chest ?- ongoing SOB with activities. DOE at 1/2 block, at grocery store.  ?- some LE edema at times, resolves with lasix prn. Takes about 3 times a week ?  ?  ?  ?  ?2. Irregular heart beat ?- PVCs noted on stress test ?- higher beta blocker dose causes fatigue, feels good on toprol 12.'5mg'$  daily ?  ?- some palpitations at times. Overall mild ?  ?  ?3. HTN ?- he is compliant with meds ?  ?4. Hyperlipidemia ?- lipitor caused muscle aches. Simvastatin caused side effects. Currently on crestor '10mg'$  daily, tolerating.  ?- recent labs with pcp ? ?May 19 2021 TC 148 TG 104 HDL 49 LDL 80 ?  ?  ?Past Medical History:  ?Diagnosis Date  ? Acid reflux   ? Adenomatous colon polyp 08/2010  ? Anxiety   ? Arthritis   ? Chronic back pain   ? Coronary artery disease   ? Diabetes mellitus without complication ([name])   ? GERD (gastroesophageal reflux disease)   ? Gout   ? Hiatal hernia   ? High cholesterol   ? HTN (hypertension)   ? Hyperlipidemia   ? Renal cyst   ? Large Left  ? Restless legs   ? Sleep apnea   ? uses cpap  ? ? ? ?  Allergies  ?Allergen Reactions  ? Ambien [Zolpidem Tartrate] Other (See Comments)  ?  Caused memory problems  ? Other Other (See Comments)  ?  Medication for insomnia ?unknown  ? Simvastatin   ?  REACTION: arm numbness ?Patient currently on this medication (03/2012).  ? ? ? ?Current Outpatient Medications  ?Medication Sig Dispense Refill  ? alfuzosin (UROXATRAL) 10 MG 24 hr tablet TAKE 1 TABLET BY MOUTH EVERYDAY AT BEDTIME 90 tablet 3  ? amLODipine (NORVASC) 10 MG tablet Take 1 tablet (10 mg total) daily by mouth. 180 tablet 3  ? clotrimazole-betamethasone (LOTRISONE) cream Apply 1 application. topically daily as needed (irritation).    ? Empagliflozin-metFORMIN HCl ER 25-1000 MG TB24 Take 1 tablet by mouth daily.    ? furosemide (LASIX) 40 MG tablet TAKE 1.5 TABLETS (60 MG TOTAL) BY MOUTH 2 (TWO) TIMES DAILY. (Patient taking differently: Take 60-80 mg by mouth daily.) 135 tablet 1  ? gabapentin  (NEURONTIN) 300 MG capsule Take 300 mg by mouth at bedtime. May take additional 300 mg if needed during the day    ? isosorbide mononitrate (IMDUR) 30 MG 24 hr tablet TAKE 1 TABLET BY MOUTH TWICE A DAY (Patient taking differently: 30 mg daily.) 180 tablet 1  ? metoprolol succinate (TOPROL-XL) 25 MG 24 hr tablet TAKE 1/2 TABLET BY MOUTH EVERY DAY 45 tablet 2  ? mometasone (NASONEX) 50 MCG/ACT nasal spray Place 2 sprays into the nose at bedtime as needed (for nasal congestion).    ? nitroGLYCERIN (NITROSTAT) 0.4 MG SL tablet Place 1 tablet (0.4 mg total) under the tongue every 5 (five) minutes as needed for chest pain. 25 tablet 3  ? pantoprazole (PROTONIX) 40 MG tablet Take 40 mg by mouth at bedtime.     ? rosuvastatin (CRESTOR) 10 MG tablet Take 10 mg by mouth daily.    ? sacubitril-valsartan (ENTRESTO) 24-26 MG Take 1 tablet by mouth 2 (two) times daily. 60 tablet 6  ? triamterene-hydrochlorothiazide (DYAZIDE) 37.5-25 MG capsule Take 1 capsule by mouth every morning.     ? ?Current Facility-Administered Medications  ?Medication Dose Route Frequency Provider Last Rate Last Admin  ? sodium chloride flush (NS) 0.9 % injection 3 mL  3 mL Intravenous Q12H Enmanuel Zufall, Alphonse Guild, MD      ? ? ? ?Past Surgical History:  ?Procedure Laterality Date  ? APPENDECTOMY  1967  ? CARDIAC CATHETERIZATION N/A 01/31/2016  ? Procedure: Left Heart Cath and Coronary Angiography;  Surgeon: Belva Crome, MD;  Location: [facility];  Service: Cardiovascular;  Laterality: N/A;  ? CARDIOVASCULAR STRESS TEST  2004  ? Treadmill Stress Test  ? CARDIOVASCULAR STRESS TEST  2010  ? CHOLECYSTECTOMY  07/2006  ? COLONOSCOPY  08/2010  ? RMR: Cecal tubular adenoma removed, otherwise normal exam. next tcs 08/2015  ? CORONARY ARTERY BYPASS GRAFT N/A 02/28/2016  ? Procedure: CORONARY ARTERY BYPASS GRAFTING x two using left internal mammary artery and right leg greater saphenous vein harvested endoscopically;  Surgeon: Gaye Pollack, MD;  Location: [facility];   Service: Open Heart Surgery;  Laterality: N/A;  ? [place]  ? Micro  ? RIGHT/LEFT HEART CATH AND CORONARY/GRAFT ANGIOGRAPHY N/A 08/09/2021  ? Procedure: RIGHT/LEFT HEART CATH AND CORONARY/GRAFT ANGIOGRAPHY;  Surgeon: Martinique, Peter M, MD;  Location: [facility];  Service: Cardiovascular;  Laterality: N/A;  ? TEE WITHOUT CARDIOVERSION N/A 02/28/2016  ? Procedure: TRANSESOPHAGEAL ECHOCARDIOGRAM (TEE);  Surgeon: Gaye Pollack, MD;  Location: [facility];  Service: Open Heart Surgery;  Laterality: N/A;  ? UPPER GASTROINTESTINAL ENDOSCOPY  08/2010  ? RMR: GERD benign biopsy  ? VASECTOMY    ? ? ? ?Allergies  ?Allergen Reactions  ? Ambien [Zolpidem Tartrate] Other (See Comments)  ?  Caused memory problems  ? Other Other (See Comments)  ?  Medication for insomnia ?unknown  ? Simvastatin   ?  REACTION: arm numbness ?Patient currently on this medication (03/2012).  ? ? ? ? ?Family History  ?Problem Relation Age of Onset  ? Lymphoma Mother   ? Cancer Mother   ? Ulcers Father   ? Cancer Father   ? Ulcers Paternal Grandfather   ?     Stomach ulcer, resection  ? Heart attack Other   ? Diabetes Other   ? ? ? ?Social History ?Mr. Koran reports that he has never smoked. He has never used smokeless tobacco. ?Mr. Smart reports current alcohol use of about 2.0 standard drinks per week. ? ? ?Review of Systems ?CONSTITUTIONAL: No weight loss, fever, chills, weakness or fatigue.  ?HEENT: Eyes: No visual loss, blurred vision, double vision or yellow sclerae.No hearing loss, sneezing, congestion, runny nose or sore throat.  ?SKIN: No rash or itching.  ?CARDIOVASCULAR: per hpi ?RESPIRATORY: No shortness of breath, cough or sputum.  ?GASTROINTESTINAL: No anorexia, nausea, vomiting or diarrhea. No abdominal pain or blood.  ?GENITOURINARY: No burning on urination, no polyuria ?NEUROLOGICAL: No headache, dizziness, syncope, paralysis, ataxia, numbness or tingling in the extremities. No change in bowel or bladder control.   ?MUSCULOSKELETAL: No muscle, back pain, joint pain or stiffness.  ?LYMPHATICS: No enlarged nodes. No history of splenectomy.  ?PSYCHIATRIC: No history of depression or anxiety.  ?ENDOCRINOLOGIC: No reports of sweating,

## 2021-08-22 ENCOUNTER — Ambulatory Visit: Payer: Medicare HMO | Admitting: Physician Assistant

## 2021-08-22 VITALS — BP 159/82 | HR 62 | Ht 69.0 in | Wt 208.0 lb

## 2021-08-22 DIAGNOSIS — Z9989 Dependence on other enabling machines and devices: Secondary | ICD-10-CM

## 2021-08-22 DIAGNOSIS — N481 Balanitis: Secondary | ICD-10-CM

## 2021-08-22 DIAGNOSIS — R339 Retention of urine, unspecified: Secondary | ICD-10-CM | POA: Diagnosis not present

## 2021-08-22 DIAGNOSIS — R350 Frequency of micturition: Secondary | ICD-10-CM

## 2021-08-22 DIAGNOSIS — R351 Nocturia: Secondary | ICD-10-CM

## 2021-08-22 DIAGNOSIS — N401 Enlarged prostate with lower urinary tract symptoms: Secondary | ICD-10-CM

## 2021-08-22 DIAGNOSIS — G4733 Obstructive sleep apnea (adult) (pediatric): Secondary | ICD-10-CM

## 2021-08-22 DIAGNOSIS — N138 Other obstructive and reflux uropathy: Secondary | ICD-10-CM | POA: Diagnosis not present

## 2021-08-22 DIAGNOSIS — R3915 Urgency of urination: Secondary | ICD-10-CM

## 2021-08-22 LAB — URINALYSIS, ROUTINE W REFLEX MICROSCOPIC
Bilirubin, UA: NEGATIVE
Ketones, UA: NEGATIVE
Leukocytes,UA: NEGATIVE
Nitrite, UA: NEGATIVE
Protein,UA: NEGATIVE
RBC, UA: NEGATIVE
Specific Gravity, UA: 1.015 (ref 1.005–1.030)
Urobilinogen, Ur: 0.2 mg/dL (ref 0.2–1.0)
pH, UA: 7 (ref 5.0–7.5)

## 2021-08-22 LAB — BLADDER SCAN AMB NON-IMAGING: Scan Result: 99

## 2021-08-22 NOTE — Progress Notes (Signed)
post void residual=99mL 

## 2021-08-22 NOTE — Progress Notes (Signed)
?Assessment: ?1. Incomplete emptying of bladder   ?2. Nocturia   ?3. Benign prostatic hyperplasia with urinary obstruction   ?4. Balanitis   ?5. Obstructive sleep apnea on CPAP   ? ? ?Plan: ?Advised the patient to continue on his current prescription of Uroxatrol, practice timed voiding, avoid fluid intake prior to bedtime, follow-up with his primary care who ordered his CPAP and inquire about switching back to the full facial mask that will allow him to breathe through his mouth.  Discussed the connection between sleep apnea and nocturia at length with the patient and all questions are answered.  Once he switches masks, he will follow-up approximately 1 month later for UA and PVR.  Should he develop symptoms of retention or if his nocturia worsens significantly prior to follow-up, he will contact our office.  Also discussed importance of maintaining tight glucose control as ongoing glucosuria can be a bladder irritant contributing to his symptoms.  He will continue clotrimazole cream as needed for symptoms of balanitis as discussed. ? ?Chief Complaint: ?Nocturia ? ?HPI: ?Thomas [name] is a 71 y.o. male who presents for continued evaluation of BPH and nocturia.  Uroxatrol has been helpful in the past, but over the past couple of months, the patient states his nocturia has increased to 3-4 times per night with a significant amount of urine voided each time he goes.  He denies frequency during the day and has no symptoms of burning, urgency, gross hematuria.  He does continue to have intermittent urethral irritation secondary to ongoing balanitis secondary to his glucose levels.  Clotrimazole cream is still effective for treatment. Since last visit, pt was tx for significant edema and underwent cardiac cath which showed no acute blockages.  He was placed on Entresto by his cardiologist since his last visit, but no other changes reported.  Edema has now improved significantly and he is taking Lasix only as needed. No  urgency/frequency during the day.  The patient's history is significant for obstructive sleep apnea for which he wears CPAP.  He states he has been on the nasal mask only for quite some time and he was recently diagnosed with chronic nasal congestion and deviated septum which prevents him from breathing through his nose well at night.  He is actually getting alarm sensors from the CPAP machine that he is not moving air.  He also recalls that he has begun waking up with an extremely dry mouth and throat indicating he may be breathing through his mouth.  He has worn full mask with mouth coverage on his CPAP in the past.  He reports his last hemoglobin A1c was in a good range, and he does not check his blood sugars, he knows he has glucose urea due to his diabetic med combination of empagliflozin/metformin.   ?UA = clear except 3+ glucose ?PVR=99 ? ?10/01/20 ?Thomas [name] is a 71yo here for followup for BPH and nocturia. IPSS 15 QOL 3. He takes uroxatral intermittently which does help his LUTS. He is on jardiance which worsens his LUTS. He takes lasix prn for lower extremity edema. He has issues with intermittent balanitis which he uses clotrimazole.  ?  ?05/23/21 ?Thomas. Jeneen [name] is a 71 year old with a history of BPH and nocturia who presents for recheck of his symptoms after beginning Uroxatrol.  He reports his symptoms improved significantly after starting the medication but admits that he has been inconsistent with taking it and now continues to have urgency and frequency.  Uroxatrol is also extremely  expensive for him.  He just refilled his prescription this week.  Balanitis is still occasional, but improved and his last A1C improved to 6.1. He continues to have edema and takes Lasix. ?IPPS 16 QOL 4 ?PVR 36 ?UA clear ?Portions of the above documentation were copied from a prior visit for review purposes only. ? ?Allergies: ?Allergies  ?Allergen Reactions  ? Ambien [Zolpidem Tartrate] Other (See Comments)  ?  Caused memory  problems  ? Other Other (See Comments)  ?  Medication for insomnia ?unknown  ? Simvastatin   ?  REACTION: arm numbness ?Patient currently on this medication (03/2012).  ? ? ?PMH: ?Past Medical History:  ?Diagnosis Date  ? Acid reflux   ? Adenomatous colon polyp 08/2010  ? Anxiety   ? Arthritis   ? Chronic back pain   ? Coronary artery disease   ? Diabetes mellitus without complication ([place])   ? GERD (gastroesophageal reflux disease)   ? Gout   ? Hiatal hernia   ? High cholesterol   ? HTN (hypertension)   ? Hyperlipidemia   ? Renal cyst   ? Large Left  ? Restless legs   ? Sleep apnea   ? uses cpap  ? ? ?PSH: ?Past Surgical History:  ?Procedure Laterality Date  ? APPENDECTOMY  1967  ? CARDIAC CATHETERIZATION N/A 01/31/2016  ? Procedure: Left Heart Cath and Coronary Angiography;  Surgeon: Belva Crome, MD;  Location: [facility];  Service: Cardiovascular;  Laterality: N/A;  ? CARDIOVASCULAR STRESS TEST  2004  ? Treadmill Stress Test  ? CARDIOVASCULAR STRESS TEST  2010  ? CHOLECYSTECTOMY  07/2006  ? COLONOSCOPY  08/2010  ? RMR: Cecal tubular adenoma removed, otherwise normal exam. next tcs 08/2015  ? CORONARY ARTERY BYPASS GRAFT N/A 02/28/2016  ? Procedure: CORONARY ARTERY BYPASS GRAFTING x two using left internal mammary artery and right leg greater saphenous vein harvested endoscopically;  Surgeon: Gaye Pollack, MD;  Location: [facility];  Service: Open Heart Surgery;  Laterality: N/A;  ? [place]  ? Micro  ? RIGHT/LEFT HEART CATH AND CORONARY/GRAFT ANGIOGRAPHY N/A 08/09/2021  ? Procedure: RIGHT/LEFT HEART CATH AND CORONARY/GRAFT ANGIOGRAPHY;  Surgeon: Martinique, Peter M, MD;  Location: [facility];  Service: Cardiovascular;  Laterality: N/A;  ? TEE WITHOUT CARDIOVERSION N/A 02/28/2016  ? Procedure: TRANSESOPHAGEAL ECHOCARDIOGRAM (TEE);  Surgeon: Gaye Pollack, MD;  Location: [facility];  Service: Open Heart Surgery;  Laterality: N/A;  ? UPPER GASTROINTESTINAL ENDOSCOPY  08/2010  ? RMR: GERD benign biopsy  ?  VASECTOMY    ? ? ?SH: ?Social History  ? ?Tobacco Use  ? Smoking status: Never  ? Smokeless tobacco: Never  ?Vaping Use  ? Vaping Use: Never used  ?Substance Use Topics  ? Alcohol use: Yes  ?  Alcohol/week: 2.0 standard drinks  ?  Types: 2 Standard drinks or equivalent per week  ?  Comment: social  ? Drug use: No  ? ? ?ROS: ?Constitutional:  Negative for fever, chills, weight loss ?CV: Negative for chest pain ?Respiratory:  Negative for shortness of breath, wheezing, sleep apnea, frequent cough ?GI:  Negative for nausea, vomiting, bloody stool, GERD ? ?PE: ?Ht '5\' 9"'$  (1.753 m)   Wt 208 lb (94.3 kg)   BMI 30.72 kg/m?  ?GENERAL APPEARANCE:  Well appearing, well developed, well nourished, NAD ?HEENT:  Atraumatic, normocephalic ?NECK:  Supple. Trachea midline ?ABDOMEN:  Soft, non-tender, no masses ?EXTREMITIES:  Moves all extremities well, without clubbing,  cyanosis, or edema ?NEUROLOGIC:  Alert and oriented x 3, normal gait, CN II-XII grossly intact ?MENTAL STATUS:  appropriate ?BACK:  Non-tender to palpation, No CVAT ?SKIN:  Warm, dry, and intact ? ? ?Results: ?Laboratory Data: ?Lab Results  ?Component Value Date  ? WBC 7.8 08/05/2021  ? HGB 12.6 (L) 08/09/2021  ? HCT 37.0 (L) 08/09/2021  ? MCV 70.3 (L) 08/05/2021  ? PLT 332 08/05/2021  ? ? ?Lab Results  ?Component Value Date  ? CREATININE 0.94 08/05/2021  ? ? ?Lab Results  ?Component Value Date  ? PSA 3.42 06/20/2011  ? PSA 2.79 01/16/2009  ? ? ?Lab Results  ?Component Value Date  ? TESTOSTERONE 411.33 01/16/2009  ? ? ?Lab Results  ?Component Value Date  ? HGBA1C 6.4 (H) 02/24/2016  ? ? ?Urinalysis ?   ?Component Value Date/Time  ? [place] YELLOW 02/24/2016 1529  ? APPEARANCEUR Clear 05/23/2021 1503  ? LABSPEC 1.016 02/24/2016 1529  ? PHURINE 7.0 02/24/2016 1529  ? GLUCOSEU 3+ (A) 05/23/2021 1503  ? [place] NEGATIVE 02/24/2016 1529  ? HGBUR negative 01/15/2009 1046  ? BILIRUBINUR Negative 05/23/2021 1503  ? [place] NEGATIVE 02/24/2016 1529  ? PROTEINUR Negative  05/23/2021 1503  ? PROTEINUR NEGATIVE 02/24/2016 1529  ? UROBILINOGEN negative (A) 08/20/2019 1028  ? UROBILINOGEN 1.0 01/15/2009 1046  ? NITRITE Negative 05/23/2021 1503  ? NITRITE NEGATIVE 02/24/2016 152

## 2021-09-08 ENCOUNTER — Ambulatory Visit: Payer: Medicare HMO | Admitting: Cardiology

## 2021-09-14 DIAGNOSIS — E1122 Type 2 diabetes mellitus with diabetic chronic kidney disease: Secondary | ICD-10-CM | POA: Diagnosis not present

## 2021-09-14 DIAGNOSIS — E782 Mixed hyperlipidemia: Secondary | ICD-10-CM | POA: Diagnosis not present

## 2021-09-20 DIAGNOSIS — E1122 Type 2 diabetes mellitus with diabetic chronic kidney disease: Secondary | ICD-10-CM | POA: Diagnosis not present

## 2021-09-20 DIAGNOSIS — N401 Enlarged prostate with lower urinary tract symptoms: Secondary | ICD-10-CM | POA: Diagnosis not present

## 2021-09-20 DIAGNOSIS — M5451 Vertebrogenic low back pain: Secondary | ICD-10-CM | POA: Diagnosis not present

## 2021-09-20 DIAGNOSIS — E782 Mixed hyperlipidemia: Secondary | ICD-10-CM | POA: Diagnosis not present

## 2021-09-20 DIAGNOSIS — R945 Abnormal results of liver function studies: Secondary | ICD-10-CM | POA: Diagnosis not present

## 2021-09-20 DIAGNOSIS — I5022 Chronic systolic (congestive) heart failure: Secondary | ICD-10-CM | POA: Diagnosis not present

## 2021-09-20 DIAGNOSIS — K219 Gastro-esophageal reflux disease without esophagitis: Secondary | ICD-10-CM | POA: Diagnosis not present

## 2021-09-20 DIAGNOSIS — R350 Frequency of micturition: Secondary | ICD-10-CM | POA: Diagnosis not present

## 2021-09-20 DIAGNOSIS — R600 Localized edema: Secondary | ICD-10-CM | POA: Diagnosis not present

## 2021-09-20 DIAGNOSIS — I2510 Atherosclerotic heart disease of native coronary artery without angina pectoris: Secondary | ICD-10-CM | POA: Diagnosis not present

## 2021-09-20 DIAGNOSIS — E669 Obesity, unspecified: Secondary | ICD-10-CM | POA: Diagnosis not present

## 2021-09-20 DIAGNOSIS — I10 Essential (primary) hypertension: Secondary | ICD-10-CM | POA: Diagnosis not present

## 2021-09-27 ENCOUNTER — Encounter: Payer: Self-pay | Admitting: Cardiology

## 2021-09-27 ENCOUNTER — Ambulatory Visit: Payer: Medicare HMO | Admitting: Cardiology

## 2021-09-27 VITALS — BP 118/70 | HR 62 | Ht 69.0 in | Wt 208.0 lb

## 2021-09-27 DIAGNOSIS — I5020 Unspecified systolic (congestive) heart failure: Secondary | ICD-10-CM | POA: Diagnosis not present

## 2021-09-27 MED ORDER — AMLODIPINE BESYLATE 5 MG PO TABS: 5.0000 mg | ORAL_TABLET | Freq: Every day | ORAL | 3 refills | Status: DC

## 2021-09-27 MED ORDER — FUROSEMIDE 40 MG PO TABS: 60.0000 mg | ORAL_TABLET | Freq: Every day | ORAL | 1 refills | Status: DC | PRN

## 2021-09-27 MED ORDER — ROSUVASTATIN CALCIUM 20 MG PO TABS: 20.0000 mg | ORAL_TABLET | Freq: Every day | ORAL | 3 refills | Status: DC

## 2021-09-27 MED ORDER — SACUBITRIL-VALSARTAN 49-51 MG PO TABS: 1.0000 | ORAL_TABLET | Freq: Two times a day (BID) | ORAL | 6 refills | Status: DC

## 2021-09-27 NOTE — Patient Instructions (Addendum)
Medication Instructions:  Increase Crestor to 20 mg tablets daily Increase Entresto to 49/51 mg tablets twice daily Decrease Amlodipine to 5 mg tablets daily Take Lasix 60 mg tablets AS NEEDED for swelling/shortness of breath   Labwork: None  Testing/Procedures: None  Follow-Up: Follow up with Dr. Harl Bowie in 2 months Nurse Visit in 3 weeks for vital check.   Any Other Special Instructions Will Be Listed Below (If Applicable).     If you need a refill on your cardiac medications before your next appointment, please call your pharmacy.

## 2021-09-27 NOTE — Progress Notes (Signed)
Clinical Summary Mr. Ferencz is a 71 y.o.male seen today for follow up of the following medical problems.    New diagnosis of HFrEF - long history of HFpEF, most recently an echo showed new systolic dysfunction and HFrEF 07/2020 echo: LVEF 60-65%, no WMAs, grade II DD - 06/2021 echo: LVEF 30-35%, grade II dd, D septum, mild RV dysfunction, PASP 44   - toprol dosing limited due to fatigue and bradycardia.      07/2021 cath: ostial LM 50%, D1 70%, LCX osital 75%, RCA small vessel mild diffuse disease. LIMA-LAD patent, SVG sequential to LAD and Om1 patent LVEDP 22, mean PA 30   PCWP 21, CI 2.87            -last visit 08/19/21 we started aldactone '25mg'$  daily.   -f/u PCP labs showed K 4.9, Cr 0.92 - no SOB/DOE      2. Hyperlipidemia - lipitor caused muscle aches. Simvastatin caused side effects. Currently on crestor '10mg'$  daily, tolerating.    May 19 2021 TC 148 TG 104 HDL 49 LDL 80 08/2021 TC 183 TG 154 HDL 53 LDL 103         Other medical issues not addressed this visit     1. CAD - history of LIMA-LAD and SVG-OM2 CABG 01/2016. LVEF 50-55% by TEE at the time - 03/2017 echo LVEF 45-50%. Grade Ii diastolic dysfunction.     08/2017 nuclear stress: PVCs, short runs NSVT. Small mild intensity mid to basal inferolatearl defect fixed most consistent with scar. LVEF 44% 10/2017 echo LVEF 50-55%, no WMAs     - metoprolol dosing limited to fatigue and bradycardia, has only been on 12.'5mg'$  daily.    10/2019 Lexiscan nuclear stress:no ischemia.  07/2020 echo: LVEF 60-65%, grade II dd, normal RV function, mild MR     - some chest pains at times.  - episode 3 weeks ago. Mid to left chest - ongoing SOB with activities. DOE at 1/2 block, at grocery store.  - some LE edema at times, resolves with lasix prn. Takes about 3 times a week         2. Irregular heart beat - PVCs noted on stress test - higher beta blocker dose causes fatigue, feels good on toprol 12.'5mg'$  daily   -  some palpitations at times. Overall mild     3. HTN - he is compliant with meds   4. Hyperlipidemia - lipitor caused muscle aches. Simvastatin caused side effects. Currently on crestor '10mg'$  daily, tolerating.  - recent labs with pcp   May 19 2021 TC 148 TG 104 HDL 49 LDL 80    Past Medical History:  Diagnosis Date   Acid reflux    Adenomatous colon polyp 08/2010   Anxiety    Arthritis    Chronic back pain    Coronary artery disease    Diabetes mellitus without complication (HCC)    GERD (gastroesophageal reflux disease)    Gout    Hiatal hernia    High cholesterol    HTN (hypertension)    Hyperlipidemia    Renal cyst    Large Left   Restless legs    Sleep apnea    uses cpap     Allergies  Allergen Reactions   Ambien [Zolpidem Tartrate] Other (See Comments)    Caused memory problems   Other Other (See Comments)    Medication for insomnia unknown   Simvastatin     REACTION: arm numbness Patient currently  on this medication (03/2012).     Current Outpatient Medications  Medication Sig Dispense Refill   alfuzosin (UROXATRAL) 10 MG 24 hr tablet TAKE 1 TABLET BY MOUTH EVERYDAY AT BEDTIME 90 tablet 3   amLODipine (NORVASC) 10 MG tablet Take 1 tablet (10 mg total) daily by mouth. 180 tablet 3   clotrimazole-betamethasone (LOTRISONE) cream Apply 1 application. topically daily as needed (irritation).     Empagliflozin-metFORMIN HCl ER 25-1000 MG TB24 Take 1 tablet by mouth daily.     furosemide (LASIX) 40 MG tablet TAKE 1.5 TABLETS (60 MG TOTAL) BY MOUTH 2 (TWO) TIMES DAILY. (Patient taking differently: Take 60-80 mg by mouth daily.) 135 tablet 1   gabapentin (NEURONTIN) 300 MG capsule Take 300 mg by mouth at bedtime. May take additional 300 mg if needed during the day     isosorbide mononitrate (IMDUR) 30 MG 24 hr tablet TAKE 1 TABLET BY MOUTH TWICE A DAY (Patient taking differently: 30 mg daily.) 180 tablet 1   metoprolol succinate (TOPROL-XL) 25 MG 24 hr tablet TAKE  1/2 TABLET BY MOUTH EVERY DAY 45 tablet 2   mometasone (NASONEX) 50 MCG/ACT nasal spray Place 2 sprays into the nose at bedtime as needed (for nasal congestion).     nitroGLYCERIN (NITROSTAT) 0.4 MG SL tablet Place 1 tablet (0.4 mg total) under the tongue every 5 (five) minutes as needed for chest pain. 25 tablet 3   pantoprazole (PROTONIX) 40 MG tablet Take 40 mg by mouth at bedtime.      rosuvastatin (CRESTOR) 10 MG tablet Take 10 mg by mouth daily.     sacubitril-valsartan (ENTRESTO) 24-26 MG Take 1 tablet by mouth 2 (two) times daily. 60 tablet 6   spironolactone (ALDACTONE) 25 MG tablet Take 1 tablet (25 mg total) by mouth daily. 90 tablet 3   Current Facility-Administered Medications  Medication Dose Route Frequency Provider Last Rate Last Admin   sodium chloride flush (NS) 0.9 % injection 3 mL  3 mL Intravenous Q12H Arnoldo Lenis, MD         Past Surgical History:  Procedure Laterality Date   [place] N/A 01/31/2016   Procedure: Left Heart Cath and Coronary Angiography;  Surgeon: Belva Crome, MD;  Location: [facility];  Service: Cardiovascular;  Laterality: N/A;   CARDIOVASCULAR STRESS TEST  2004   Treadmill Stress Test   CARDIOVASCULAR STRESS TEST  2010   CHOLECYSTECTOMY  07/2006   COLONOSCOPY  08/2010   RMR: Cecal tubular adenoma removed, otherwise normal exam. next tcs 08/2015   CORONARY ARTERY BYPASS GRAFT N/A 02/28/2016   Procedure: CORONARY ARTERY BYPASS GRAFTING x two using left internal mammary artery and right leg greater saphenous vein harvested endoscopically;  Surgeon: Gaye Pollack, MD;  Location: [facility];  Service: Open Heart Surgery;  Laterality: N/A;   KNEE SURGERY  1995   Micro   RIGHT/LEFT HEART CATH AND CORONARY/GRAFT ANGIOGRAPHY N/A 08/09/2021   Procedure: RIGHT/LEFT HEART CATH AND CORONARY/GRAFT ANGIOGRAPHY;  Surgeon: Martinique, Peter M, MD;  Location: [facility];  Service: Cardiovascular;  Laterality: N/A;    TEE WITHOUT CARDIOVERSION N/A 02/28/2016   Procedure: TRANSESOPHAGEAL ECHOCARDIOGRAM (TEE);  Surgeon: Gaye Pollack, MD;  Location: [facility];  Service: Open Heart Surgery;  Laterality: N/A;   UPPER GASTROINTESTINAL ENDOSCOPY  08/2010   RMR: GERD benign biopsy   VASECTOMY       Allergies  Allergen Reactions   Ambien [Zolpidem Tartrate] Other (  See Comments)    Caused memory problems   Other Other (See Comments)    Medication for insomnia unknown   Simvastatin     REACTION: arm numbness Patient currently on this medication (03/2012).      Family History  Problem Relation Age of Onset   Lymphoma Mother    Cancer Mother    Ulcers Father    Cancer Father    Ulcers Paternal Grandfather        Stomach ulcer, resection   Heart attack Other    Diabetes Other      Social History Mr. Dubs reports that he has never smoked. He has never used smokeless tobacco. Mr. Eves reports current alcohol use of about 2.0 standard drinks per week.   Review of Systems CONSTITUTIONAL: No weight loss, fever, chills, weakness or fatigue.  HEENT: Eyes: No visual loss, blurred vision, double vision or yellow sclerae.No hearing loss, sneezing, congestion, runny nose or sore throat.  SKIN: No rash or itching.  CARDIOVASCULAR: per hpi RESPIRATORY: No shortness of breath, cough or sputum.  GASTROINTESTINAL: No anorexia, nausea, vomiting or diarrhea. No abdominal pain or blood.  GENITOURINARY: No burning on urination, no polyuria NEUROLOGICAL: No headache, dizziness, syncope, paralysis, ataxia, numbness or tingling in the extremities. No change in bowel or bladder control.  MUSCULOSKELETAL: No muscle, back pain, joint pain or stiffness.  LYMPHATICS: No enlarged nodes. No history of splenectomy.  PSYCHIATRIC: No history of depression or anxiety.  ENDOCRINOLOGIC: No reports of sweating, cold or heat intolerance. No polyuria or polydipsia.  Marland Kitchen   Physical Examination Today's Vitals   09/27/21 1406   BP: 118/70  Pulse: 62  SpO2: 95%  Weight: 208 lb (94.3 kg)  Height: '5\' 9"'$  (1.753 m)   Body mass index is 30.72 kg/m.  Gen: resting comfortably, no acute distress HEENT: no scleral icterus, pupils equal round and reactive, no palptable cervical adenopathy,  CV: RRR, no m/r/g no jvd Resp: Clear to auscultation bilaterally GI: abdomen is soft, non-tender, non-distended, normal bowel sounds, no hepatosplenomegaly MSK: extremities are warm, no edema.  Skin: warm, no rash Neuro:  no focal deficits Psych: appropriate affect   Diagnostic Studies  10/2019 nuclear stress There was no ST segment deviation noted during stress. Findings consistent with prior inferior myocardial infarction, there is no current ischemia. This is an intermediate risk study. Risk based on decreased LVEF, there is no current myocardium at jeopardy. Consider correlating LVEF with echo. The left ventricular ejection fraction is mildly decreased (44%).     07/2020 echo 1. Left ventricular ejection fraction, by estimation, is 60 to 65%. The  left ventricle has normal function. The left ventricle has no regional  wall motion abnormalities. Left ventricular diastolic parameters are  consistent with Grade II diastolic  dysfunction (pseudonormalization).   2. Right ventricular systolic function is normal. The right ventricular  size is normal. There is mildly elevated pulmonary artery systolic  pressure.   3. Left atrial size was mild to moderately dilated.   4. The mitral valve is normal in structure. Mild mitral valve  regurgitation.   5. The aortic valve is normal in structure. Aortic valve regurgitation is  not visualized.   6. The inferior vena cava is dilated in size with >50% respiratory  variability, suggesting right atrial pressure of 8 mmHg.    07/2021 cath Ost LM to Mid LM lesion is 50% stenosed.   1st Diag lesion is 70% stenosed.   Ost Cx lesion is 75% stenosed.  Prox Graft to Mid Graft lesion  between Ost LAD and 1st Mrg  is 35% stenosed.   LIMA graft was visualized by angiography and is normal in caliber.   SVG graft was visualized by angiography and is normal in caliber.   The graft exhibits no disease.   LV end diastolic pressure is moderately elevated.   Hemodynamic findings consistent with mild pulmonary hypertension.   Left main and ostial LCx obstructive disease. Moderate mid diagonal stenosis Patent LIMA to the LAD Patent SVG to OM 1 Moderately elevated LV filling pressures. LVEDP 22 mm Hg. PCWP mean 21 mm Hg Mild pulmonary HTN mean PAP 30 mm Hg Preserved cardiac output 6 L/min   Plan: optimize medical therapy for CHF. Decrease in LV function cannot be explained by ischemia.    Assessment and Plan   1. HFrEF - long history of HFpEF, new diagnosis of HFrEF based on 06/2021 echo - cath without new disease -increase entresto to 49/51 mg bid, lower norvasc to '5mg'$  daily to allow room with bp. Nursing visit 2 weeks for vitals, would likely increase entresto at that time to 98/'102mg'$  bid. Already on goal doses of jardiance, aldactone. Has not tolerated high toprol doses due to fatigue and bradycardia.  - f/u with me 2 months, at that time likely obtain repeat echo to reassess LVEF       Arnoldo Lenis, M.D.

## 2021-10-03 ENCOUNTER — Encounter: Payer: Self-pay | Admitting: Internal Medicine

## 2021-10-18 ENCOUNTER — Ambulatory Visit: Payer: Medicare HMO | Admitting: *Deleted

## 2021-10-18 VITALS — BP 100/78 | HR 61

## 2021-10-18 DIAGNOSIS — I10 Essential (primary) hypertension: Secondary | ICD-10-CM

## 2021-10-18 NOTE — Progress Notes (Signed)
Patient in office this evening for vitals check.  Recent increase on his Entresto to 49/51 twice a day & decrease his Amlodipine to '5mg'$  daily.  Lasix '60mg'$  as needed.   BP today 100/78  61  93%    States he is feeling too good because he is working all the time.  Tolerating changes without difficulty.

## 2021-10-19 NOTE — Progress Notes (Signed)
Blood pressure ok but low end of normal. I don't think he needs the norvasc any more since we increased his other meds which help both blood pressure and his weakened heart muscle. Would hold norvasc, if not too inconvenient could come back 1-2 weeks for repeat bp check to be sure numbers look good without it   Zandra Abts MD

## 2021-10-25 ENCOUNTER — Encounter: Payer: Self-pay | Admitting: *Deleted

## 2021-10-25 NOTE — Progress Notes (Unsigned)
Relayed information via mychart.  Waiting on reply from patient.

## 2021-11-02 ENCOUNTER — Ambulatory Visit: Payer: Medicare HMO | Admitting: Gastroenterology

## 2021-11-04 NOTE — Progress Notes (Unsigned)
Left message to return call or reply to message via mychart.

## 2021-11-08 NOTE — Progress Notes (Signed)
Patient notified via letter

## 2021-12-01 ENCOUNTER — Ambulatory Visit: Payer: Medicare HMO | Admitting: Cardiology

## 2021-12-01 ENCOUNTER — Encounter: Payer: Self-pay | Admitting: Cardiology

## 2021-12-01 VITALS — BP 112/76 | HR 63 | Ht 69.0 in | Wt 206.4 lb

## 2021-12-01 DIAGNOSIS — I5022 Chronic systolic (congestive) heart failure: Secondary | ICD-10-CM | POA: Diagnosis not present

## 2021-12-01 DIAGNOSIS — E782 Mixed hyperlipidemia: Secondary | ICD-10-CM

## 2021-12-01 DIAGNOSIS — I5020 Unspecified systolic (congestive) heart failure: Secondary | ICD-10-CM | POA: Diagnosis not present

## 2021-12-01 DIAGNOSIS — I10 Essential (primary) hypertension: Secondary | ICD-10-CM | POA: Diagnosis not present

## 2021-12-01 NOTE — Patient Instructions (Signed)
Medication Instructions:  Stop Amlodipine (Norvasc) Continue all other medications.     Labwork: none  Testing/Procedures: Your physician has requested that you have an echocardiogram. Echocardiography is a painless test that uses sound waves to create images of your heart. It provides your doctor with information about the size and shape of your heart and how well your heart's chambers and valves are working. This procedure takes approximately one hour. There are no restrictions for this procedure. Office will contact with results via phone, letter or mychart.     Follow-Up: Pending test results   Any Other Special Instructions Will Be Listed Below (If Applicable).   If you need a refill on your cardiac medications before your next appointment, please call your pharmacy.

## 2021-12-01 NOTE — Progress Notes (Signed)
Clinical Summary Thomas [name] is a 71 y.o.male seen today for follow up of the following medical problems.    New diagnosis of HFrEF - long history of HFpEF, most recently an echo showed new systolic dysfunction and HFrEF 07/2020 echo: LVEF 60-65%, no WMAs, grade II DD - 06/2021 echo: LVEF 30-35%, grade II dd, D septum, mild RV dysfunction, PASP 44   - toprol dosing limited due to fatigue and bradycardia.       07/2021 cath: ostial LM 50%, D1 70%, LCX osital 75%, RCA small vessel mild diffuse disease. LIMA-LAD patent, SVG sequential to LAD and Om1 patent LVEDP 22, mean PA 30   PCWP 21, CI 2.87  - no recent SOB/DOE, no LE edema - compliant with meds, tolerating without side effects    2. Hyperlipidemia - lipitor caused muscle aches. Simvastatin caused side effects. Currently on crestor '10mg'$  daily, tolerating.     May 19 2021 TC 148 TG 104 HDL 49 LDL 80 08/2021 TC 183 TG 154 HDL 53 LDL 103  -crestor inceased to '20mg'$  daily 08/2021             Other medical issues not addressed this visit     1. CAD - history of LIMA-LAD and SVG-OM2 CABG 01/2016. LVEF 50-55% by TEE at the time - 03/2017 echo LVEF 45-50%. Grade Ii diastolic dysfunction.     08/2017 nuclear stress: PVCs, short runs NSVT. Small mild intensity mid to basal inferolatearl defect fixed most consistent with scar. LVEF 44% 10/2017 echo LVEF 50-55%, no WMAs     - metoprolol dosing limited to fatigue and bradycardia, has only been on 12.'5mg'$  daily.    10/2019 Lexiscan nuclear stress:no ischemia.  07/2020 echo: LVEF 60-65%, grade II dd, normal RV function, mild MR     - some chest pains at times.  - episode 3 weeks ago. Mid to left chest - ongoing SOB with activities. DOE at 1/2 block, at grocery store.  - some LE edema at times, resolves with lasix prn. Takes about 3 times a week         2. Irregular heart beat - PVCs noted on stress test - higher beta blocker dose causes fatigue, feels good on toprol 12.'5mg'$   daily   - some palpitations at times. Overall mild     3. HTN - he is compliant with meds   4. Hyperlipidemia - lipitor caused muscle aches. Simvastatin caused side effects. Currently on crestor '10mg'$  daily, tolerating.  - recent labs with pcp   May 19 2021 TC 148 TG 104 HDL 49 LDL 80 08/2021 TC 183 TG 154 HDL 53 LDL 103   Past Medical History:  Diagnosis Date   Acid reflux    Adenomatous colon polyp 08/2010   Anxiety    Arthritis    Chronic back pain    Coronary artery disease    Diabetes mellitus without complication (HCC)    GERD (gastroesophageal reflux disease)    Gout    Hiatal hernia    High cholesterol    HTN (hypertension)    Hyperlipidemia    Renal cyst    Large Left   Restless legs    Sleep apnea    uses cpap     Allergies  Allergen Reactions   Ambien [Zolpidem Tartrate] Other (See Comments)    Caused memory problems   Other Other (See Comments)    Medication for insomnia unknown   Simvastatin     REACTION: arm  numbness Patient currently on this medication (03/2012).     Current Outpatient Medications  Medication Sig Dispense Refill   alfuzosin (UROXATRAL) 10 MG 24 hr tablet TAKE 1 TABLET BY MOUTH EVERYDAY AT BEDTIME 90 tablet 3   amLODipine (NORVASC) 5 MG tablet Take 1 tablet (5 mg total) by mouth daily. 90 tablet 3   clotrimazole-betamethasone (LOTRISONE) cream Apply 1 application. topically daily as needed (irritation).     Empagliflozin-metFORMIN HCl ER 25-1000 MG TB24 Take 1 tablet by mouth daily.     furosemide (LASIX) 40 MG tablet Take 1.5 tablets (60 mg total) by mouth daily as needed (For swelling, shortness of breath). 135 tablet 1   gabapentin (NEURONTIN) 300 MG capsule Take 300 mg by mouth at bedtime. May take additional 300 mg if needed during the day     isosorbide mononitrate (IMDUR) 30 MG 24 hr tablet TAKE 1 TABLET BY MOUTH TWICE A DAY (Patient taking differently: 30 mg daily.) 180 tablet 1   metoprolol succinate (TOPROL-XL) 25 MG 24  hr tablet TAKE 1/2 TABLET BY MOUTH EVERY DAY 45 tablet 2   mometasone (NASONEX) 50 MCG/ACT nasal spray Place 2 sprays into the nose at bedtime as needed (for nasal congestion).     nitroGLYCERIN (NITROSTAT) 0.4 MG SL tablet Place 1 tablet (0.4 mg total) under the tongue every 5 (five) minutes as needed for chest pain. 25 tablet 3   pantoprazole (PROTONIX) 40 MG tablet Take 40 mg by mouth at bedtime.      rosuvastatin (CRESTOR) 20 MG tablet Take 1 tablet (20 mg total) by mouth daily. 90 tablet 3   sacubitril-valsartan (ENTRESTO) 49-51 MG Take 1 tablet by mouth 2 (two) times daily. 60 tablet 6   spironolactone (ALDACTONE) 25 MG tablet Take 1 tablet (25 mg total) by mouth daily. 90 tablet 3   Current Facility-Administered Medications  Medication Dose Route Frequency Provider Last Rate Last Admin   sodium chloride flush (NS) 0.9 % injection 3 mL  3 mL Intravenous Q12H Arnoldo Lenis, MD         Past Surgical History:  Procedure Laterality Date   [place] N/A 01/31/2016   Procedure: Left Heart Cath and Coronary Angiography;  Surgeon: Belva Crome, MD;  Location: [facility];  Service: Cardiovascular;  Laterality: N/A;   CARDIOVASCULAR STRESS TEST  2004   Treadmill Stress Test   CARDIOVASCULAR STRESS TEST  2010   CHOLECYSTECTOMY  07/2006   COLONOSCOPY  08/2010   RMR: Cecal tubular adenoma removed, otherwise normal exam. next tcs 08/2015   CORONARY ARTERY BYPASS GRAFT N/A 02/28/2016   Procedure: CORONARY ARTERY BYPASS GRAFTING x two using left internal mammary artery and right leg greater saphenous vein harvested endoscopically;  Surgeon: Gaye Pollack, MD;  Location: [facility];  Service: Open Heart Surgery;  Laterality: N/A;   KNEE SURGERY  1995   Micro   RIGHT/LEFT HEART CATH AND CORONARY/GRAFT ANGIOGRAPHY N/A 08/09/2021   Procedure: RIGHT/LEFT HEART CATH AND CORONARY/GRAFT ANGIOGRAPHY;  Surgeon: Martinique, Peter M, MD;  Location: [facility];   Service: Cardiovascular;  Laterality: N/A;   TEE WITHOUT CARDIOVERSION N/A 02/28/2016   Procedure: TRANSESOPHAGEAL ECHOCARDIOGRAM (TEE);  Surgeon: Gaye Pollack, MD;  Location: [facility];  Service: Open Heart Surgery;  Laterality: N/A;   UPPER GASTROINTESTINAL ENDOSCOPY  08/2010   RMR: GERD benign biopsy   VASECTOMY       Allergies  Allergen Reactions   Ambien [Zolpidem  Tartrate] Other (See Comments)    Caused memory problems   Other Other (See Comments)    Medication for insomnia unknown   Simvastatin     REACTION: arm numbness Patient currently on this medication (03/2012).      Family History  Problem Relation Age of Onset   Lymphoma Mother    Cancer Mother    Ulcers Father    Cancer Father    Ulcers Paternal Grandfather        Stomach ulcer, resection   Heart attack Other    Diabetes Other      Social History Mr. Truex reports that he has never smoked. He has never used smokeless tobacco. Mr. Paskett reports current alcohol use of about 2.0 standard drinks of alcohol per week.   Review of Systems CONSTITUTIONAL: No weight loss, fever, chills, weakness or fatigue.  HEENT: Eyes: No visual loss, blurred vision, double vision or yellow sclerae.No hearing loss, sneezing, congestion, runny nose or sore throat.  SKIN: No rash or itching.  CARDIOVASCULAR: per hpi RESPIRATORY: No shortness of breath, cough or sputum.  GASTROINTESTINAL: No anorexia, nausea, vomiting or diarrhea. No abdominal pain or blood.  GENITOURINARY: No burning on urination, no polyuria NEUROLOGICAL: No headache, dizziness, syncope, paralysis, ataxia, numbness or tingling in the extremities. No change in bowel or bladder control.  MUSCULOSKELETAL: No muscle, back pain, joint pain or stiffness.  LYMPHATICS: No enlarged nodes. No history of splenectomy.  PSYCHIATRIC: No history of depression or anxiety.  ENDOCRINOLOGIC: No reports of sweating, cold or heat intolerance. No polyuria or polydipsia.   Marland Kitchen   Physical Examination Today's Vitals   12/01/21 1558  BP: 112/76  Pulse: 63  SpO2: 93%  Weight: 206 lb 6.4 oz (93.6 kg)  Height: '5\' 9"'$  (1.753 m)   Body mass index is 30.48 kg/m.  Gen: resting comfortably, no acute distress HEENT: no scleral icterus, pupils equal round and reactive, no palptable cervical adenopathy,  CV: RRR, no m/r/g no jvd Resp: Clear to auscultation bilaterally GI: abdomen is soft, non-tender, non-distended, normal bowel sounds, no hepatosplenomegaly MSK: extremities are warm, no edema.  Skin: warm, no rash Neuro:  no focal deficits Psych: appropriate affect   Diagnostic Studies 10/2019 nuclear stress There was no ST segment deviation noted during stress. Findings consistent with prior inferior myocardial infarction, there is no current ischemia. This is an intermediate risk study. Risk based on decreased LVEF, there is no current myocardium at jeopardy. Consider correlating LVEF with echo. The left ventricular ejection fraction is mildly decreased (44%).     07/2020 echo 1. Left ventricular ejection fraction, by estimation, is 60 to 65%. The  left ventricle has normal function. The left ventricle has no regional  wall motion abnormalities. Left ventricular diastolic parameters are  consistent with Grade II diastolic  dysfunction (pseudonormalization).   2. Right ventricular systolic function is normal. The right ventricular  size is normal. There is mildly elevated pulmonary artery systolic  pressure.   3. Left atrial size was mild to moderately dilated.   4. The mitral valve is normal in structure. Mild mitral valve  regurgitation.   5. The aortic valve is normal in structure. Aortic valve regurgitation is  not visualized.   6. The inferior vena cava is dilated in size with >50% respiratory  variability, suggesting right atrial pressure of 8 mmHg.    07/2021 cath Ost LM to Mid LM lesion is 50% stenosed.   1st Diag lesion is 70% stenosed.    Colon Fl[place]  Cx lesion is 75% stenosed.   Prox Graft to Mid Graft lesion between Ost LAD and 1st Mrg  is 35% stenosed.   LIMA graft was visualized by angiography and is normal in caliber.   SVG graft was visualized by angiography and is normal in caliber.   The graft exhibits no disease.   LV end diastolic pressure is moderately elevated.   Hemodynamic findings consistent with mild pulmonary hypertension.   Left main and ostial LCx obstructive disease. Moderate mid diagonal stenosis Patent LIMA to the LAD Patent SVG to OM 1 Moderately elevated LV filling pressures. LVEDP 22 mm Hg. PCWP mean 21 mm Hg Mild pulmonary HTN mean PAP 30 mm Hg Preserved cardiac output 6 L/min   Plan: optimize medical therapy for CHF. Decrease in LV function cannot be explained by ischemia.     Assessment and Plan   1. HFrEF - long history of HFpEF, new diagnosis of HFrEF based on 06/2021 echo - cath without new disease -repeat echo and reassess LV function  2. HTN - can d/c norvasc, room to titrate entresto if additonal bp control needed  3. Hyperlipidemia - crestor increased to '20mg'$  08/2021, continue to monitor.   F/u pending echo results.    Arnoldo Lenis, M.D.

## 2021-12-06 ENCOUNTER — Ambulatory Visit (INDEPENDENT_AMBULATORY_CARE_PROVIDER_SITE_OTHER): Payer: Medicare HMO

## 2021-12-06 DIAGNOSIS — I5022 Chronic systolic (congestive) heart failure: Secondary | ICD-10-CM | POA: Diagnosis not present

## 2021-12-06 LAB — ECHOCARDIOGRAM COMPLETE
AR max vel: 2.35 cm2
AV Peak grad: 9.5 mmHg
Ao pk vel: 1.54 m/s
Area-P 1/2: 2.05 cm2
Calc EF: 45.8 %
MV M vel: 4.39 m/s
MV Peak grad: 77 mmHg
P 1/2 time: 2064 msec
S' Lateral: 3.57 cm
Single Plane A2C EF: 49.7 %
Single Plane A4C EF: 44.7 %

## 2021-12-09 ENCOUNTER — Other Ambulatory Visit: Payer: Self-pay | Admitting: Cardiology

## 2021-12-15 DIAGNOSIS — D509 Iron deficiency anemia, unspecified: Secondary | ICD-10-CM | POA: Diagnosis not present

## 2021-12-15 DIAGNOSIS — E1122 Type 2 diabetes mellitus with diabetic chronic kidney disease: Secondary | ICD-10-CM | POA: Diagnosis not present

## 2021-12-15 DIAGNOSIS — E782 Mixed hyperlipidemia: Secondary | ICD-10-CM | POA: Diagnosis not present

## 2021-12-19 ENCOUNTER — Encounter: Payer: Self-pay | Admitting: *Deleted

## 2021-12-22 DIAGNOSIS — E782 Mixed hyperlipidemia: Secondary | ICD-10-CM | POA: Diagnosis not present

## 2021-12-22 DIAGNOSIS — R600 Localized edema: Secondary | ICD-10-CM | POA: Diagnosis not present

## 2021-12-22 DIAGNOSIS — N401 Enlarged prostate with lower urinary tract symptoms: Secondary | ICD-10-CM | POA: Diagnosis not present

## 2021-12-22 DIAGNOSIS — M5451 Vertebrogenic low back pain: Secondary | ICD-10-CM | POA: Diagnosis not present

## 2021-12-22 DIAGNOSIS — I10 Essential (primary) hypertension: Secondary | ICD-10-CM | POA: Diagnosis not present

## 2021-12-22 DIAGNOSIS — R945 Abnormal results of liver function studies: Secondary | ICD-10-CM | POA: Diagnosis not present

## 2021-12-22 DIAGNOSIS — G4733 Obstructive sleep apnea (adult) (pediatric): Secondary | ICD-10-CM | POA: Diagnosis not present

## 2021-12-22 DIAGNOSIS — R350 Frequency of micturition: Secondary | ICD-10-CM | POA: Diagnosis not present

## 2021-12-22 DIAGNOSIS — K219 Gastro-esophageal reflux disease without esophagitis: Secondary | ICD-10-CM | POA: Diagnosis not present

## 2021-12-22 DIAGNOSIS — I5022 Chronic systolic (congestive) heart failure: Secondary | ICD-10-CM | POA: Diagnosis not present

## 2021-12-22 DIAGNOSIS — E1122 Type 2 diabetes mellitus with diabetic chronic kidney disease: Secondary | ICD-10-CM | POA: Diagnosis not present

## 2021-12-22 DIAGNOSIS — I2510 Atherosclerotic heart disease of native coronary artery without angina pectoris: Secondary | ICD-10-CM | POA: Diagnosis not present

## 2022-01-28 DIAGNOSIS — I130 Hypertensive heart and chronic kidney disease with heart failure and stage 1 through stage 4 chronic kidney disease, or unspecified chronic kidney disease: Secondary | ICD-10-CM | POA: Diagnosis not present

## 2022-01-28 DIAGNOSIS — N1831 Chronic kidney disease, stage 3a: Secondary | ICD-10-CM | POA: Diagnosis not present

## 2022-01-28 DIAGNOSIS — E782 Mixed hyperlipidemia: Secondary | ICD-10-CM | POA: Diagnosis not present

## 2022-01-28 DIAGNOSIS — E1122 Type 2 diabetes mellitus with diabetic chronic kidney disease: Secondary | ICD-10-CM | POA: Diagnosis not present

## 2022-02-03 ENCOUNTER — Encounter: Payer: Self-pay | Admitting: Nurse Practitioner

## 2022-03-08 DIAGNOSIS — E1142 Type 2 diabetes mellitus with diabetic polyneuropathy: Secondary | ICD-10-CM | POA: Diagnosis not present

## 2022-03-08 DIAGNOSIS — E785 Hyperlipidemia, unspecified: Secondary | ICD-10-CM | POA: Diagnosis not present

## 2022-03-08 DIAGNOSIS — E669 Obesity, unspecified: Secondary | ICD-10-CM | POA: Diagnosis not present

## 2022-03-08 DIAGNOSIS — I509 Heart failure, unspecified: Secondary | ICD-10-CM | POA: Diagnosis not present

## 2022-03-08 DIAGNOSIS — K219 Gastro-esophageal reflux disease without esophagitis: Secondary | ICD-10-CM | POA: Diagnosis not present

## 2022-03-08 DIAGNOSIS — I272 Pulmonary hypertension, unspecified: Secondary | ICD-10-CM | POA: Diagnosis not present

## 2022-03-08 DIAGNOSIS — E1122 Type 2 diabetes mellitus with diabetic chronic kidney disease: Secondary | ICD-10-CM | POA: Diagnosis not present

## 2022-03-08 DIAGNOSIS — R69 Illness, unspecified: Secondary | ICD-10-CM | POA: Diagnosis not present

## 2022-03-08 DIAGNOSIS — M1990 Unspecified osteoarthritis, unspecified site: Secondary | ICD-10-CM | POA: Diagnosis not present

## 2022-03-08 DIAGNOSIS — G4733 Obstructive sleep apnea (adult) (pediatric): Secondary | ICD-10-CM | POA: Diagnosis not present

## 2022-03-08 DIAGNOSIS — I130 Hypertensive heart and chronic kidney disease with heart failure and stage 1 through stage 4 chronic kidney disease, or unspecified chronic kidney disease: Secondary | ICD-10-CM | POA: Diagnosis not present

## 2022-03-08 DIAGNOSIS — I25119 Atherosclerotic heart disease of native coronary artery with unspecified angina pectoris: Secondary | ICD-10-CM | POA: Diagnosis not present

## 2022-03-19 NOTE — Progress Notes (Unsigned)
Cardiology Office Note:    Date:  03/20/2022   ID:  Thomas, [name] Sep 08, 1950, MRN [id number]  PCP:  Celene Squibb, MD   [place] Providers Cardiologist:  Carlyle Dolly, MD     Referring MD: Celene Squibb, MD   CC: Here for follow-up  History of Present Illness:    Thomas [name] is a 71 y.o. male with a hx of the following:  CHF (EF 30-35 -> 40-45% 11/2021) CAD, s/p CABG x 2 in 2017 GERD Renal cyst Type 2 diabetes Hyperlipidemia Hypertension Hx of Pulm HTN  Initially evaluated by Dr. Carlyle Dolly in 2015 for chest pain.  Symptoms were somewhat atypical at that time.  His EKG that day revealed sinus bradycardia, LAFB likely causing poor R wave progression, and Q waves suggestive of prior anteroseptal infarct.  Echo revealed EF 55 to 60%, mild LVH, grade 1 DD, mild PR, mildly dilated right atrium, moderately dilated left atrium.  Follow-up Myoview was overall normal.   In 2017 he underwent left heart cath that revealed multivessel CAD, with severe circumflex disease and distal LAD disease, and was followed by CABG x2 with left internal mammary artery to LAD and SVG to OM 2, TEE revealed 50-55% EF. Echocardiogram arranged for some shortness of breath in December 2018 revealed EF mildly reduced at 45 to 50% with diffuse hypokinesis, features were consistent with a pseudonormal left ventricular filling pattern with concomitant abnormal relaxation and increased filling pattern, grade 2 DD with a high ventricular filling pattern, PA peak pressure was 31 mmHg, left atrium was mildly dilated.  In May 2019 he reported chest pain symptoms and nuclear stress test did not show diagnostic ST segment changes to indicate ischemia, did not show any new blockages, nuclear stress EF was 44%.  Updated echocardiogram 2 months later revealed improved EF at 50 to 55%, no RWMA, mild aortic root enlargement with mild MR noted.  There is also moderately calcified annulus of aortic  valve.  In 2021 he noted some atypical chest pain that lasted over the last several years with some increase in his symptoms as well as some dyspnea on exertion, repeat Lexiscan showed prior MI without current ischemia, no ST segment deviation was noted, LVEF was mildly decreased at 44%.  EF improved to 60 to 65% in April 2022 with grade 2 diastolic dysfunction noted, no RWMA noted, there was mildly elevated PASP, mild MR, mild to moderate LAE, RAP at 8 mmHG.  In 2023 he noted some orthopnea and abdominal distention as well as dyspnea on exertion, his home weights trended up.  Repeat TTE revealed EF at 30 to 35%, global hypokinesis of left ventricle, grade 2 DD, mild dilation of left ventricular internal cavity size, right ventricular volume overload mildly reduced RVSF, mildly elevated PASP, estimated RVSP 44 mmHg, mild to moderate LAE, mild RAE, mild to moderate MR, mild dilatation of ascending aorta measuring 40 mm.  He underwent right and left heart cath the following month in April 2023 that revealed left main and ostial left circumflex obstructive disease, moderate mid diagonal stenosis with patent LIMA to LAD as well as patent SVG to OM1.  There was moderately elevated LV filling pressures with mild pulmonary hypertension, mean PAP 30 mmHg with preserved cardiac output.  Optimizing medical therapy for CHF was recommended. GDMT adjusted at outpatient f/u.   Last seen by Dr. Carlyle Dolly on December 01, 2021.  He was overall doing very well from a cardiac perspective,  denied shortness of breath or any dyspnea on exertion, no lower extremity edema.  Was compliant with his medication and tolerating his meds well.  Updated TTE revealed EF 40 to 45%, mild LVH, probable LV apical trabeculation noted, consider limited study with Definity contrast to better exclude mural thrombus, normal PASP, estimated RVSP 26.2 mmHg, mild RAE, mild MR, trivial AR, aortic dilatation noted, mild dilatation measuring 40 mm.  Was  told to follow-up in 3 months based on these results.  Today he presents for follow-up.  He states his lower extremity swelling is down and improved and only takes Lasix as needed. Does admit to shortness of breath when laying down at night and episodes of waking up feeling SHOB, however, CPAP helps his symptoms, does state he has a deviated septum.  Chief complaint today is lower mid chest cramping that is "constant," similar in presentation to last office visit and stated he has been taking Protonix 40 mg daily, but medication does not really seem to be making a difference.  States he has shortness along the backbone of upper back, stated he is not sure if his symptoms are MSK or GERD related. Has been taking nitroglycerin and this has helped. Symptoms are worse with exertion and are "more prevalent." Denies any palpitations, syncope, presyncope, dizziness, bleeding, swelling or significant weight changes, or claudication. States will need patient assistance to help with cost of Entresto. Stays active by doing yard work. Denies any other questions or concerns.   Past Medical History:  Diagnosis Date   Acid reflux    Adenomatous colon polyp 08/2010   Anxiety    Arthritis    Chronic back pain    Coronary artery disease    Diabetes mellitus without complication (HCC)    GERD (gastroesophageal reflux disease)    Gout    Hiatal hernia    High cholesterol    HTN (hypertension)    Hyperlipidemia    Renal cyst    Large Left   Restless legs    Sleep apnea    uses cpap    Past Surgical History:  Procedure Laterality Date   APPENDECTOMY  1967   CARDIAC CATHETERIZATION N/A 01/31/2016   Procedure: Left Heart Cath and Coronary Angiography;  Surgeon: Belva Crome, MD;  Location: [facility];  Service: Cardiovascular;  Laterality: N/A;   CARDIOVASCULAR STRESS TEST  2004   Treadmill Stress Test   CARDIOVASCULAR STRESS TEST  2010   CHOLECYSTECTOMY  07/2006   COLONOSCOPY  08/2010   RMR: Cecal  tubular adenoma removed, otherwise normal exam. next tcs 08/2015   CORONARY ARTERY BYPASS GRAFT N/A 02/28/2016   Procedure: CORONARY ARTERY BYPASS GRAFTING x two using left internal mammary artery and right leg greater saphenous vein harvested endoscopically;  Surgeon: Gaye Pollack, MD;  Location: [facility];  Service: Open Heart Surgery;  Laterality: N/A;   KNEE SURGERY  1995   Micro   RIGHT/LEFT HEART CATH AND CORONARY/GRAFT ANGIOGRAPHY N/A 08/09/2021   Procedure: RIGHT/LEFT HEART CATH AND CORONARY/GRAFT ANGIOGRAPHY;  Surgeon: Martinique, Peter M, MD;  Location: [facility];  Service: Cardiovascular;  Laterality: N/A;   TEE WITHOUT CARDIOVERSION N/A 02/28/2016   Procedure: TRANSESOPHAGEAL ECHOCARDIOGRAM (TEE);  Surgeon: Gaye Pollack, MD;  Location: [facility];  Service: Open Heart Surgery;  Laterality: N/A;   UPPER GASTROINTESTINAL ENDOSCOPY  08/2010   RMR: GERD benign biopsy   VASECTOMY      Current Medications: Current Meds  Medication Sig  alfuzosin (UROXATRAL) 10 MG 24 hr tablet TAKE 1 TABLET BY MOUTH EVERYDAY AT BEDTIME   clotrimazole-betamethasone (LOTRISONE) cream Apply 1 application. topically daily as needed (irritation).   Empagliflozin-metFORMIN HCl ER 25-1000 MG TB24 Take 1 tablet by mouth daily.   furosemide (LASIX) 40 MG tablet Take 1.5 tablets (60 mg total) by mouth daily as needed (For swelling, shortness of breath).   gabapentin (NEURONTIN) 300 MG capsule Take 300 mg by mouth at bedtime. May take additional 300 mg if needed during the day   isosorbide mononitrate (IMDUR) 30 MG 24 hr tablet TAKE 1 TABLET BY MOUTH TWICE A DAY   metoprolol succinate (TOPROL-XL) 25 MG 24 hr tablet TAKE 1/2 TABLET BY MOUTH EVERY DAY   mometasone (NASONEX) 50 MCG/ACT nasal spray Place 2 sprays into the nose at bedtime as needed (for nasal congestion).   nitroGLYCERIN (NITROSTAT) 0.4 MG SL tablet Place 1 tablet (0.4 mg total) under the tongue every 5 (five) minutes as needed for chest pain.    pantoprazole (PROTONIX) 40 MG tablet Take 40 mg by mouth at bedtime.    rosuvastatin (CRESTOR) 20 MG tablet Take 1 tablet (20 mg total) by mouth daily.   sacubitril-valsartan (ENTRESTO) 49-51 MG Take 1 tablet by mouth 2 (two) times daily.   spironolactone (ALDACTONE) 25 MG tablet Take 1 tablet (25 mg total) by mouth daily.    Allergies:   Ambien [zolpidem tartrate] and Simvastatin   Social History   Socioeconomic History   Marital status: Divorced    Spouse name: Not on file   Number of children: 3   Years of education: Not on file   Highest education level: Not on file  Occupational History   Occupation: retired; Programmer, systems: RETIRED  Tobacco Use   Smoking status: Never    Passive exposure: Never   Smokeless tobacco: Never  Vaping Use   Vaping Use: Never used  Substance and Sexual Activity   Alcohol use: Yes    Alcohol/week: 2.0 standard drinks of alcohol    Types: 2 Standard drinks or equivalent per week    Comment: social   Drug use: No   Sexual activity: Not on file  Other Topics Concern   Not on file  Social History Narrative   14 years education   Married to [employer]   Has 3 children   Drinks caffeine   Lives in [place]   No regular exercise   Social Determinants of Health   Financial Resource Strain: Not on file  Food Insecurity: Not on file  Transportation Needs: Not on file  Physical Activity: Not on file  Stress: Not on file  Social Connections: Not on file     Family History: The patient's family history includes Cancer in his father and mother; Diabetes in an other family member; Heart attack in an other family member; Lymphoma in his mother; Ulcers in his father and paternal grandfather.  ROS:   Review of Systems  Constitutional:  Positive for malaise/fatigue. Negative for chills, diaphoresis, fever and weight loss.  HENT:  Negative for congestion, ear discharge, ear pain, hearing loss, nosebleeds, sinus pain, sore throat and  tinnitus.        See HPI. Deviated septum.   Eyes: Negative.   Respiratory:  Positive for shortness of breath. Negative for cough, hemoptysis, sputum production, wheezing and stridor.        See HPI.   Cardiovascular:  Positive for chest pain, orthopnea and PND. Negative for palpitations,  claudication and leg swelling.       See HPI.   Gastrointestinal:  Positive for heartburn. Negative for abdominal pain, blood in stool, constipation, diarrhea, melena, nausea and vomiting.       See HPI.   Genitourinary: Negative.   Musculoskeletal:  Positive for back pain and joint pain. Negative for falls, myalgias and neck pain.       See HPI.   Skin: Negative.   Neurological: Negative.   Endo/Heme/Allergies: Negative.   Psychiatric/Behavioral: Negative.      Please see the history of present illness.    All other systems reviewed and are negative.  EKGs/Labs/Other Studies Reviewed:    The following studies were reviewed today:   EKG:  EKG is not ordered today.   2D complete echo on December 06, 2021:  1. Left ventricular ejection fraction, by estimation, is 40 to 45%. The  left ventricle has mildly decreased function. The left ventricle  demonstrates global hypokinesis. There is mild left ventricular  hypertrophy. Left ventricular diastolic parameters  are indeterminate. The average left ventricular global longitudinal strain  is -17.5 %. The global longitudinal strain is normal.   2. Probable LV apical trabeculation noted, consider limited study with  Definity contrast to better exclude mural thrombus.   3. Right ventricular systolic function is normal. The right ventricular  size is normal. There is normal pulmonary artery systolic pressure. The  estimated right ventricular systolic pressure is 18.5 mmHg.   4. Left atrial size was upper normal.   5. Right atrial size was mildly dilated.   6. The mitral valve is grossly normal. Mild mitral valve regurgitation.   7. The aortic valve is  tricuspid. Aortic valve regurgitation is trivial.   8. Aortic dilatation noted. There is mild dilatation of the ascending  aorta, measuring 40 mm.   9. The inferior vena cava is normal in size with greater than 50%  respiratory variability, suggesting right atrial pressure of 3 mmHg.   Comparison(s): Prior images reviewed side by side. LVEF has improved to  approximately 45% and GLS also now normal range. RVSP has normalized.    Right and left heart cath on August 09, 2021:   Ost LM to Mid LM lesion is 50% stenosed.   1st Diag lesion is 70% stenosed.   Ost Cx lesion is 75% stenosed.   Prox Graft to Mid Graft lesion between Ost LAD and 1st Mrg  is 35% stenosed.   LIMA graft was visualized by angiography and is normal in caliber.   SVG graft was visualized by angiography and is normal in caliber.   The graft exhibits no disease.   LV end diastolic pressure is moderately elevated.   Hemodynamic findings consistent with mild pulmonary hypertension.   Left main and ostial LCx obstructive disease. Moderate mid diagonal stenosis Patent LIMA to the LAD Patent SVG to OM 1 Moderately elevated LV filling pressures. LVEDP 22 mm Hg. PCWP mean 21 mm Hg Mild pulmonary HTN mean PAP 30 mm Hg Preserved cardiac output 6 L/min   Plan: optimize medical therapy for CHF. Decrease in LV function cannot be explained by ischemia.   Lexiscan stress test on October 30, 2019: There was no ST segment deviation noted during stress. Findings consistent with prior inferior myocardial infarction, there is no current ischemia. This is an intermediate risk study. Risk based on decreased LVEF, there is no current myocardium at jeopardy. Consider correlating LVEF with echo. The left ventricular ejection fraction is mildly decreased (44%).  TEE on February 28, 2016:  Left ventricle: Normal cavity size and wall thickness. LV systolic  function is low normal with an EF of 50-55%. There are no obvious wall  motion  abnormalities.   Left atrium: Cavity is dilated and dilated.   Aortic valve: The valve is trileaflet. No regurgitation.   Mitral valve: Trace regurgitation.   Right ventricle: Normal cavity size, wall thickness and ejection  fraction.   Pulmonic valve: Mild regurgitation.   Left ventricle: Normal cavity size.   Vascular ultrasound Doppler pre-CABG carotid and arms on February 24, 2016: - The vertebral arteries appear patent with antegrade flow.  - Findings consistent with 1-39 percent stenosis involving the    right internal carotid artery and the left internal carotid    artery.  - Pedal artery waveforms within normal limits.  Left heart cath on January 31, 2016: Ost Cx lesion, 75 %stenosed. LM lesion, 50 %stenosed. The left ventricular systolic function is normal. The left ventricular ejection fraction is 50-55% by visual estimate. LV end diastolic pressure is mildly elevated.   Eccentric 70-80% proximal/ostial circumflex. Circumflex territory is large. 50% stenosis in the distal left main. Codominant right coronary Overall normal LV function with mild elevation and end-diastolic pressure.   RECOMMENDATIONS:   Optimize medical therapy, although the patient is unable to use statin therapy due to side effects. Will add low-dose beta blocker therapy. Beta-blockade dosing will be compromised by relatively slow resting heart rate. Consider CABG versus higher risk PCI of the ostial circumflex in the setting of distal left main disease.Marland Kitchen  Lexiscan stress test on August 02, 2015: No diagnostic ST segment changes. Occasional PVCs noted during exercise, more frequent in recovery. Nonlimiting chest pain reported with hypertensive response. Intermediate risk Duke treadmill score of 3.5. Small, moderate intensity, mid to apical inferolateral defect that is reversible consistent with ischemia. This is an intermediate risk study. Nuclear stress EF: 37%, appears higher visually. Suggest  echocardiogram to further assess.  Recent Labs: 08/05/2021: B Natriuretic Peptide 156.0; BUN 17; Creatinine, Ser 0.94; Magnesium 2.1; Platelets 332 08/09/2021: Hemoglobin 12.6; Potassium 3.4; Sodium 142  Recent Lipid Panel    Component Value Date/Time   CHOL 193 06/20/2011 1106   TRIG 250 (H) 06/20/2011 1106   HDL 41 06/20/2011 1106   CHOLHDL 4.7 06/20/2011 1106   VLDL 50 (H) 06/20/2011 1106   LDLCALC 102 (H) 06/20/2011 1106   Physical Exam:    VS:  BP 138/84 (BP Location: Right Arm, Patient Position: Sitting, Cuff Size: Normal)   Pulse 63   Ht '5\' 9"'$  (1.753 m)   Wt 202 lb 3.2 oz (91.7 kg)   SpO2 93%   BMI 29.86 kg/m     Wt Readings from Last 3 Encounters:  03/20/22 202 lb 3.2 oz (91.7 kg)  12/01/21 206 lb 6.4 oz (93.6 kg)  09/27/21 208 lb (94.3 kg)     GEN: Overweight, 71 y.o. Caucasian male in NAD.  HEENT: Normal NECK: No JVD; No carotid bruits CARDIAC: S1/S2, RRR, no murmurs, rubs, gallops; 2+ peripheral pulses throughout, strong and equal bilaterally, tenderness to palpation along mid chest over lower sternum area. RESPIRATORY:  Clear and diminished to auscultation without rales, wheezing or rhonchi  MUSCULOSKELETAL:  No edema; No deformity, tenderness to palpation along mid upper spine, without radiation.  SKIN: Warm and dry NEUROLOGIC:  Alert and oriented x 3 PSYCHIATRIC:  Normal, pleasant affect   ASSESSMENT:    1. Coronary artery disease involving native coronary artery of native  heart with angina pectoris ([place])   2. Heart failure with mildly reduced ejection fraction (HFmrEF) ([place])   3. Mixed hyperlipidemia   4. PVC's (premature ventricular contractions)   5. Essential hypertension   6. History of pulmonary hypertension   7. Aortic dilatation (HCC)    PLAN:    In order of problems listed above:  CAD, s/p CABG x 2 Most recent right and left heart cath performed in April 2023 that revealed left main and ostial left circumflex obstructive disease with  moderate mid diagonal stenosis, patent LIMA to LAD, patent SVG to OM1, moderately elevated LV filling pressure with mild pulmonary hypertension, mean PAP 30 mmHg, medical therapy recommended with optimizing medical therapy for CHF.  He states he has had the symptoms before, but now they are more prevalent, describes this as a constant cramping sensation along mid lower chest, not sure if it is musculoskeletal related or related to GERD.  Has taken nitroglycerin that is helped.  Discussed repeating ischemic evaluation, including repeating Lexiscan, and he politely declines this and would rather do medical management.  We will increase Imdur from 30 mg daily to 45 mg daily, as well as increase PPI to Protonix 40 mg twice daily x2 weeks, then reduce to 40 mg daily, as he does have history of GERD.  Have a low threshold for repeating Lexiscan if medical titration does not resolve his symptoms.  Continue Toprol-XL, nitroglycerin as needed, and will increase dosage of Crestor (see below).  Also recommended Tylenol 1000 mg twice daily, as some of his symptoms sound musculoskeletal in nature and he had tenderness to palpation along lower mid sternum and upper spine of back.  Heart healthy diet and regular cardiovascular exercise encouraged.  ED precautions discussed.  HFrEF -> HFmrEF Most recent echocardiogram performed in August 2023 that revealed EF mildly reduced at 40 to 45%, global hypokinesis noted along left ventricle, mild LVH, with normal PASP, estimated RVSP was 26.2 mmHg. Last echo revealed probable LV apical trabeculation noted, consider limited study with definity contrast to better exclude mural thrombus; however, Dr. Carlyle Dolly stated apex looked fine, and did not need repeat limited Echo. Euvolemic and well compensated on exam. Does note some SHOB at night, however deviated septum could be contributing to this while wearing CPAP. Does not appear volume up, appears euvolemic. Low sodium diet, fluid  restriction <2L, and daily weights encouraged. Educated to contact our office for weight gain of 2 lbs overnight or 5 lbs in one week.  Continue Lasix as needed, Toprol-XL, Entresto, and Aldactone.  Increasing Imdur dosage as mentioned above.  At next OV, if blood pressure permits and his above symptoms have resolved, plan to increase Entresto to 97/103 mg twice daily. Patient assistance paperwork arranged to help with cost of Entresto.  ED precautions discussed.   Hyperlipidemia Labs from August 2023 revealed total cholesterol 177, HDL 51, LDL 102, and triglycerides 137.  Would like to see LDL less than 70.  Currently tolerating rosuvastatin dosage well, will increase Crestor to 40 mg daily.  We will obtain FLP and LFT in 2 months from now. Heart healthy diet and regular cardiovascular exercise encouraged.   Hx of PVC's  Denies any recent palpitations or tachycardia.  He is in normal sinus rhythm on exam without any early beats detected.  Continue Toprol-XL. Heart healthy diet and regular cardiovascular exercise encouraged.   HTN Blood pressure today 138/84.  Discussed SBP goal < 130.  Will increase Imdur as mentioned above.  If at next follow-up visit his symptoms have resolved and BP allows, plan to increase Entresto to 97/103 mg twice daily. Discussed to monitor BP at home at least 2 hours after medications and sitting for 5-10 minutes.  Continue Toprol-XL and Entresto.  If blood pressure has improved, plan to lower dose of Aldactone while increasing Entresto at next OV as mentioned above.  Hx of pulmonary HTN Mild pulmonary hypertension noted on right and left heart cath in April 2023 with mean PAP at 30 mmHg.  Most recent echocardiogram revealed normal PASP with estimated RVSP at 26.2 mmHg.  Appears previous pulmonary HTN to be group 2 PAH, with improvement in pulmonary artery systolic pressure with CHF medical treatment.  Plan to update TTE once GDMT is fully uptitrated or sooner if clinically  indicated. Heart healthy diet and regular cardiovascular exercise encouraged.  ED precautions discussed.   Aortic dilatation Most recent TTE revealed mild dilatation of ascending aorta, measuring 40 mm.  Plan to update TTE within the following year or sooner if indicated. Heart healthy diet and regular cardiovascular exercise encouraged.      8.  Disposition: Follow-up with me or Dr. Carlyle Dolly in 6 to 8 weeks or sooner if anything changes.   Medication Adjustments/Labs and Tests Ordered: Current medicines are reviewed at length with the patient today.  Concerns regarding medicines are outlined above.  Orders Placed This Encounter  Procedures   Lipid panel   Hepatic function panel   Meds ordered this encounter  Medications   isosorbide mononitrate (IMDUR) 30 MG 24 hr tablet    Sig: Take 1.5 tablets (45 mg total) by mouth daily.    Dispense:  45 tablet    Refill:  5    03/20/22 Dose increase   pantoprazole (PROTONIX) 40 MG tablet    Sig: Take 1 tablet (40 mg total) by mouth 2 (two) times daily. X 2 weeks then take 40 mg once a day    Dispense:  90 tablet    Refill:  1    03/20/22 Dose Increase   rosuvastatin (CRESTOR) 40 MG tablet    Sig: Take 1 tablet (40 mg total) by mouth daily.    Dispense:  90 tablet    Refill:  1    03/20/22 Dose Increase    Patient Instructions  Medication Instructions:  Your physician has recommended you make the following change in your medication:  Increase Imdur to 45 mg (1.5 tablets) once a day.  Take extra strength tylenol 1000 mg twice a day Increase Protonix to 40 mg twice a day for 2 weeks then take 40 mg once a day Increase rosuvastatin to 40 mg once a day Continue all other medications as directed  Labwork: Fasting Lipid and LFT's in 2 months @ Dr. Wende Neighbors ([facility])  Testing/Procedures: none  Follow-Up:  Your physician recommends that you schedule a follow-up appointment in: 4-6 weeks  Any Other Special Instructions Will  Be Listed Below (If Applicable).  If you experience any worse pain, please go to ED.  If you need a refill on your cardiac medications before your next appointment, please call your pharmacy.    SignedFinis Bud, NP  03/20/2022 12:08 PM    [place]

## 2022-03-20 ENCOUNTER — Encounter: Payer: Self-pay | Admitting: Nurse Practitioner

## 2022-03-20 ENCOUNTER — Ambulatory Visit: Payer: Medicare HMO | Attending: Nurse Practitioner | Admitting: Nurse Practitioner

## 2022-03-20 VITALS — BP 138/84 | HR 63 | Ht 69.0 in | Wt 202.2 lb

## 2022-03-20 DIAGNOSIS — I77819 Aortic ectasia, unspecified site: Secondary | ICD-10-CM | POA: Diagnosis not present

## 2022-03-20 DIAGNOSIS — E782 Mixed hyperlipidemia: Secondary | ICD-10-CM | POA: Diagnosis not present

## 2022-03-20 DIAGNOSIS — Z8679 Personal history of other diseases of the circulatory system: Secondary | ICD-10-CM | POA: Diagnosis not present

## 2022-03-20 DIAGNOSIS — I493 Ventricular premature depolarization: Secondary | ICD-10-CM

## 2022-03-20 DIAGNOSIS — I10 Essential (primary) hypertension: Secondary | ICD-10-CM | POA: Diagnosis not present

## 2022-03-20 DIAGNOSIS — I5022 Chronic systolic (congestive) heart failure: Secondary | ICD-10-CM

## 2022-03-20 DIAGNOSIS — I25119 Atherosclerotic heart disease of native coronary artery with unspecified angina pectoris: Secondary | ICD-10-CM | POA: Diagnosis not present

## 2022-03-20 MED ORDER — ISOSORBIDE MONONITRATE ER 30 MG PO TB24: 45.0000 mg | ORAL_TABLET | Freq: Every day | ORAL | 5 refills | Status: DC

## 2022-03-20 MED ORDER — PANTOPRAZOLE SODIUM 40 MG PO TBEC: 40.0000 mg | DELAYED_RELEASE_TABLET | Freq: Two times a day (BID) | ORAL | 1 refills | Status: DC

## 2022-03-20 MED ORDER — ROSUVASTATIN CALCIUM 40 MG PO TABS: 40.0000 mg | ORAL_TABLET | Freq: Every day | ORAL | 1 refills | Status: DC

## 2022-03-20 NOTE — Patient Instructions (Addendum)
Medication Instructions:  Your physician has recommended you make the following change in your medication:  Increase Imdur to 45 mg (1.5 tablets) once a day.  Take extra strength tylenol 1000 mg twice a day Increase Protonix to 40 mg twice a day for 2 weeks then take 40 mg once a day Increase rosuvastatin to 40 mg once a day Continue all other medications as directed  Labwork: Fasting Lipid and LFT's in 2 months @ Dr. Wende Neighbors ([facility])  Testing/Procedures: none  Follow-Up:  Your physician recommends that you schedule a follow-up appointment in: 4-6 weeks  Any Other Special Instructions Will Be Listed Below (If Applicable).  If you experience any worse pain, please go to ED.  If you need a refill on your cardiac medications before your next appointment, please call your pharmacy.

## 2022-03-22 ENCOUNTER — Ambulatory Visit: Payer: Medicare HMO | Admitting: Nurse Practitioner

## 2022-03-29 DIAGNOSIS — E782 Mixed hyperlipidemia: Secondary | ICD-10-CM | POA: Diagnosis not present

## 2022-03-29 DIAGNOSIS — D509 Iron deficiency anemia, unspecified: Secondary | ICD-10-CM | POA: Diagnosis not present

## 2022-03-29 DIAGNOSIS — E1122 Type 2 diabetes mellitus with diabetic chronic kidney disease: Secondary | ICD-10-CM | POA: Diagnosis not present

## 2022-03-31 DIAGNOSIS — I5022 Chronic systolic (congestive) heart failure: Secondary | ICD-10-CM | POA: Diagnosis not present

## 2022-03-31 DIAGNOSIS — R945 Abnormal results of liver function studies: Secondary | ICD-10-CM | POA: Diagnosis not present

## 2022-03-31 DIAGNOSIS — E1122 Type 2 diabetes mellitus with diabetic chronic kidney disease: Secondary | ICD-10-CM | POA: Diagnosis not present

## 2022-03-31 DIAGNOSIS — R600 Localized edema: Secondary | ICD-10-CM | POA: Diagnosis not present

## 2022-03-31 DIAGNOSIS — N401 Enlarged prostate with lower urinary tract symptoms: Secondary | ICD-10-CM | POA: Diagnosis not present

## 2022-03-31 DIAGNOSIS — M5451 Vertebrogenic low back pain: Secondary | ICD-10-CM | POA: Diagnosis not present

## 2022-03-31 DIAGNOSIS — K219 Gastro-esophageal reflux disease without esophagitis: Secondary | ICD-10-CM | POA: Diagnosis not present

## 2022-03-31 DIAGNOSIS — G4733 Obstructive sleep apnea (adult) (pediatric): Secondary | ICD-10-CM | POA: Diagnosis not present

## 2022-03-31 DIAGNOSIS — E782 Mixed hyperlipidemia: Secondary | ICD-10-CM | POA: Diagnosis not present

## 2022-03-31 DIAGNOSIS — R350 Frequency of micturition: Secondary | ICD-10-CM | POA: Diagnosis not present

## 2022-03-31 DIAGNOSIS — I2510 Atherosclerotic heart disease of native coronary artery without angina pectoris: Secondary | ICD-10-CM | POA: Diagnosis not present

## 2022-03-31 DIAGNOSIS — I10 Essential (primary) hypertension: Secondary | ICD-10-CM | POA: Diagnosis not present

## 2022-04-13 ENCOUNTER — Other Ambulatory Visit: Payer: Self-pay | Admitting: Cardiology

## 2022-04-19 ENCOUNTER — Ambulatory Visit: Payer: Medicare HMO | Admitting: Cardiology

## 2022-05-08 ENCOUNTER — Other Ambulatory Visit: Payer: Self-pay | Admitting: Cardiology

## 2022-05-11 ENCOUNTER — Ambulatory Visit: Payer: Medicare HMO | Attending: Cardiology | Admitting: Nurse Practitioner

## 2022-05-11 ENCOUNTER — Telehealth: Payer: Self-pay | Admitting: *Deleted

## 2022-05-11 ENCOUNTER — Encounter: Payer: Self-pay | Admitting: Nurse Practitioner

## 2022-05-11 VITALS — BP 145/82 | HR 56 | Ht 69.0 in | Wt 207.0 lb

## 2022-05-11 DIAGNOSIS — Z79899 Other long term (current) drug therapy: Secondary | ICD-10-CM | POA: Diagnosis not present

## 2022-05-11 DIAGNOSIS — I493 Ventricular premature depolarization: Secondary | ICD-10-CM

## 2022-05-11 DIAGNOSIS — I77819 Aortic ectasia, unspecified site: Secondary | ICD-10-CM | POA: Diagnosis not present

## 2022-05-11 DIAGNOSIS — I10 Essential (primary) hypertension: Secondary | ICD-10-CM | POA: Diagnosis not present

## 2022-05-11 DIAGNOSIS — Z951 Presence of aortocoronary bypass graft: Secondary | ICD-10-CM | POA: Diagnosis not present

## 2022-05-11 DIAGNOSIS — I2510 Atherosclerotic heart disease of native coronary artery without angina pectoris: Secondary | ICD-10-CM | POA: Diagnosis not present

## 2022-05-11 DIAGNOSIS — Z8679 Personal history of other diseases of the circulatory system: Secondary | ICD-10-CM

## 2022-05-11 DIAGNOSIS — E785 Hyperlipidemia, unspecified: Secondary | ICD-10-CM

## 2022-05-11 DIAGNOSIS — I25119 Atherosclerotic heart disease of native coronary artery with unspecified angina pectoris: Secondary | ICD-10-CM

## 2022-05-11 DIAGNOSIS — I5022 Chronic systolic (congestive) heart failure: Secondary | ICD-10-CM

## 2022-05-11 NOTE — Progress Notes (Signed)
Cardiology Office Note:    Date:  05/11/2022   ID:  Thomas [name], DOB [birth year], MRN [id number]  PCP:  Celene Squibb, MD   [facility] Cardiologist:  Carlyle Dolly, MD     Referring MD: Celene Squibb, MD   CC: Here for follow-up  History of Present Illness:    Thomas [name] is a very pleasant 72 y.o. male with a hx of the following:  CHF (EF 30-35 -> 40-45% 11/2021) CAD, s/p CABG x 2 in 2017 GERD Renal cyst Type 2 diabetes Hyperlipidemia Hypertension Hx of Pulm HTN  Initially evaluated by Dr. Carlyle Dolly in 2015 for chest pain.  Symptoms were somewhat atypical at that time.  His EKG that day revealed sinus bradycardia, LAFB likely causing poor R wave progression, and Q waves suggestive of prior anteroseptal infarct.  Echo revealed EF 55 to 60%, mild LVH, grade 1 DD, mild PR, mildly dilated right atrium, moderately dilated left atrium.  Follow-up Myoview was overall normal.   In 2017 he underwent left heart cath that revealed multivessel CAD, with severe circumflex disease and distal LAD disease, and was followed by CABG x2 with left internal mammary artery to LAD and SVG to OM 2, TEE revealed 50-55% EF. Echocardiogram arranged for some shortness of breath in December 2018 revealed EF mildly reduced at 45 to 50% with diffuse hypokinesis, features were consistent with a pseudonormal left ventricular filling pattern with concomitant abnormal relaxation and increased filling pattern, grade 2 DD with a high ventricular filling pattern, PA peak pressure was 31 mmHg, left atrium was mildly dilated.  In May 2019 he reported chest pain symptoms and nuclear stress test did not show diagnostic ST segment changes to indicate ischemia, did not show any new blockages, nuclear stress EF was 44%.  Updated echocardiogram 2 months later revealed improved EF at 50 to 55%, no RWMA, mild aortic root enlargement with mild MR noted.  There is also moderately calcified annulus of  aortic valve.  In 2021 he noted some atypical chest pain that lasted over the last several years with some increase in his symptoms as well as some dyspnea on exertion, repeat Lexiscan showed prior MI without current ischemia, no ST segment deviation was noted, LVEF was mildly decreased at 44%.  EF improved to 60 to 65% in April 2022 with grade 2 diastolic dysfunction noted, no RWMA noted, there was mildly elevated PASP, mild MR, mild to moderate LAE, RAP at 8 mmHG.  In 2023 he noted some orthopnea and abdominal distention as well as dyspnea on exertion, his home weights trended up.  Repeat TTE revealed EF at 30 to 35%, global hypokinesis of left ventricle, grade 2 DD, mild dilation of left ventricular internal cavity size, right ventricular volume overload mildly reduced RVSF, mildly elevated PASP, estimated RVSP 44 mmHg, mild to moderate LAE, mild RAE, mild to moderate MR, mild dilatation of ascending aorta measuring 40 mm.  He underwent right and left heart cath the following month in April 2023 that revealed left main and ostial left circumflex obstructive disease, moderate mid diagonal stenosis with patent LIMA to LAD as well as patent SVG to OM1.  There was moderately elevated LV filling pressures with mild pulmonary hypertension, mean PAP 30 mmHg with preserved cardiac output.  Optimizing medical therapy for CHF was recommended. GDMT adjusted at outpatient f/u.   Last seen by Dr. Carlyle Dolly on December 01, 2021.  He was overall doing very well from a  cardiac perspective.  Updated TTE revealed EF 40 to 45%, mild LVH, probable LV apical trabeculation noted, consider limited study with Definity contrast to better exclude mural thrombus, normal PASP, estimated RVSP 26.2 mmHg, mild RAE, mild MR, trivial AR, aortic dilatation noted, mild dilatation measuring 40 mm.    I saw patient for follow-up on 03/20/2022. Admitted to shortness of breath when laying down at night and episodes of waking up feeling  SHOB, however, CPAP helped his symptoms.  CC was lower mid chest constant cramping. Symptoms are worse with exertion and are "more prevalent." Denied any palpitations, syncope, presyncope, dizziness, bleeding, swelling or significant weight changes, or claudication. Stayed active by doing yard work. Imdur and PPI dosage was increased.Told to follow-up in 6-8 weeks.   Today he presents for follow-up.  Chest cramping has resolved after medication was adjusted at last visit.  Today he is doing well from a cardiac perspective.  Denies any acute cardiac complaints or concerns.  Tolerating his medications well.  In his free time he enjoys playing [place] and plays banjo, guitar, and stand up base.  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  Denies any other questions or concerns today.  Past Medical History:  Diagnosis Date   Acid reflux    Adenomatous colon polyp 08/2010   Anxiety    Arthritis    Chronic back pain    Coronary artery disease    Diabetes mellitus without complication (HCC)    GERD (gastroesophageal reflux disease)    Gout    Hiatal hernia    High cholesterol    HTN (hypertension)    Hyperlipidemia    Renal cyst    Large Left   Restless legs    Sleep apnea    uses cpap    Past Surgical History:  Procedure Laterality Date   APPENDECTOMY  1967   CARDIAC CATHETERIZATION N/A 01/31/2016   Procedure: Left Heart Cath and Coronary Angiography;  Surgeon: Belva Crome, MD;  Location: [facility];  Service: Cardiovascular;  Laterality: N/A;   CARDIOVASCULAR STRESS TEST  2004   Treadmill Stress Test   CARDIOVASCULAR STRESS TEST  2010   CHOLECYSTECTOMY  07/2006   COLONOSCOPY  08/2010   RMR: Cecal tubular adenoma removed, otherwise normal exam. next tcs 08/2015   CORONARY ARTERY BYPASS GRAFT N/A 02/28/2016   Procedure: CORONARY ARTERY BYPASS GRAFTING x two using left internal mammary artery and right leg greater saphenous vein harvested  endoscopically;  Surgeon: Gaye Pollack, MD;  Location: [facility];  Service: Open Heart Surgery;  Laterality: N/A;   KNEE SURGERY  1995   Micro   RIGHT/LEFT HEART CATH AND CORONARY/GRAFT ANGIOGRAPHY N/A 08/09/2021   Procedure: RIGHT/LEFT HEART CATH AND CORONARY/GRAFT ANGIOGRAPHY;  Surgeon: Martinique, Peter M, MD;  Location: [facility];  Service: Cardiovascular;  Laterality: N/A;   TEE WITHOUT CARDIOVERSION N/A 02/28/2016   Procedure: TRANSESOPHAGEAL ECHOCARDIOGRAM (TEE);  Surgeon: Gaye Pollack, MD;  Location: [facility];  Service: Open Heart Surgery;  Laterality: N/A;   UPPER GASTROINTESTINAL ENDOSCOPY  08/2010   RMR: GERD benign biopsy   VASECTOMY      Current Medications: Current Meds  Medication Sig   alfuzosin (UROXATRAL) 10 MG 24 hr tablet TAKE 1 TABLET BY MOUTH EVERYDAY AT BEDTIME   Amlodipine (Norvasc) 5 mg tablet Take 1 tablet by mouth daily.   clotrimazole-betamethasone (LOTRISONE) cream Apply 1 application. topically daily as needed (irritation).   Empagliflozin-metFORMIN HCl  ER 25-1000 MG TB24 Take 1 tablet by mouth daily.   furosemide (LASIX) 40 MG tablet Take 1.5 tablets (60 mg total) by mouth daily as needed (For swelling, shortness of breath).   gabapentin (NEURONTIN) 300 MG capsule Take 300 mg by mouth at bedtime. May take additional 300 mg if needed during the day   Iron-FA-B Cmp-C-Biot-Probiotic (fusion plus) capsules Take 1 capsule by mouth daily.   isosorbide mononitrate (IMDUR) 30 MG 24 hr tablet TAKE 1.5 TABLETS BY MOUTH daily.   Meloxicam (Mobic) 7.5 m daily Take 1 tablet by mouth 2 times daily.   metoprolol succinate (TOPROL-XL) 25 MG 24 hr tablet TAKE 1/2 TABLET BY MOUTH EVERY DAY   mometasone (NASONEX) 50 MCG/ACT nasal spray Place 2 sprays into the nose at bedtime as needed (for nasal congestion).   nitroGLYCERIN (NITROSTAT) 0.4 MG SL tablet Place 1 tablet (0.4 mg total) under the tongue every 5 (five) minutes as needed for chest pain.   pantoprazole (PROTONIX) 40 MG  tablet Take 1 tablet by mouth 2 times daily for 2 weeks.  Then take 1 tablet daily.   rosuvastatin (CRESTOR) 40 MG tablet Take 1 tablet (40 mg total) by mouth daily.   sacubitril-valsartan (ENTRESTO) 49-51 MG Take 1 tablet by mouth 2 (two) times daily.   spironolactone (ALDACTONE) 25 MG tablet Take 1 tablet (25 mg total) by mouth daily.    Allergies:   Ambien [zolpidem tartrate] and Simvastatin   Social History   Socioeconomic History   Marital status: Divorced    Spouse name: Not on file   Number of children: 3   Years of education: Not on file   Highest education level: Not on file  Occupational History   Occupation: retired; Programmer, systems: RETIRED  Tobacco Use   Smoking status: Never    Passive exposure: Never   Smokeless tobacco: Never  Vaping Use   Vaping Use: Never used  Substance and Sexual Activity   Alcohol use: Yes    Alcohol/week: 2.0 standard drinks of alcohol    Types: 2 Standard drinks or equivalent per week    Comment: social   Drug use: No   Sexual activity: Not on file  Other Topics Concern   Not on file  Social History Narrative   14 years education   Married to [employer]   Has 3 children   Drinks caffeine   Lives in [place]   No regular exercise   Social Determinants of Health   Financial Resource Strain: Not on file  Food Insecurity: Not on file  Transportation Needs: Not on file  Physical Activity: Not on file  Stress: Not on file  Social Connections: Not on file     Family History: The patient's family history includes Cancer in his father and mother; Diabetes in an other family member; Heart attack in an other family member; Lymphoma in his mother; Ulcers in his father and paternal grandfather.  ROS:   Review of Systems  Constitutional: Negative.   HENT: Negative.    Eyes: Negative.   Respiratory: Negative.    Cardiovascular: Negative.   Gastrointestinal: Negative.   Genitourinary: Negative.   Musculoskeletal:  Negative.   Skin: Negative.   Neurological: Negative.   Endo/Heme/Allergies: Negative.   Psychiatric/Behavioral: Negative.       Please see the history of present illness.    All other systems reviewed and are negative.  EKGs/Labs/Other Studies Reviewed:    The following studies were reviewed today:  EKG:  EKG is not ordered today.   2D complete echo on December 06, 2021:  1. Left ventricular ejection fraction, by estimation, is 40 to 45%. The  left ventricle has mildly decreased function. The left ventricle  demonstrates global hypokinesis. There is mild left ventricular  hypertrophy. Left ventricular diastolic parameters  are indeterminate. The average left ventricular global longitudinal strain  is -17.5 %. The global longitudinal strain is normal.   2. Probable LV apical trabeculation noted, consider limited study with  Definity contrast to better exclude mural thrombus.   3. Right ventricular systolic function is normal. The right ventricular  size is normal. There is normal pulmonary artery systolic pressure. The  estimated right ventricular systolic pressure is 84.1 mmHg.   4. Left atrial size was upper normal.   5. Right atrial size was mildly dilated.   6. The mitral valve is grossly normal. Mild mitral valve regurgitation.   7. The aortic valve is tricuspid. Aortic valve regurgitation is trivial.   8. Aortic dilatation noted. There is mild dilatation of the ascending  aorta, measuring 40 mm.   9. The inferior vena cava is normal in size with greater than 50%  respiratory variability, suggesting right atrial pressure of 3 mmHg.   Comparison(s): Prior images reviewed side by side. LVEF has improved to  approximately 45% and GLS also now normal range. RVSP has normalized.    Right and left heart cath on August 09, 2021:   Ost LM to Mid LM lesion is 50% stenosed.   1st Diag lesion is 70% stenosed.   Ost Cx lesion is 75% stenosed.   Prox Graft to Mid Graft lesion  between Ost LAD and 1st Mrg  is 35% stenosed.   LIMA graft was visualized by angiography and is normal in caliber.   SVG graft was visualized by angiography and is normal in caliber.   The graft exhibits no disease.   LV end diastolic pressure is moderately elevated.   Hemodynamic findings consistent with mild pulmonary hypertension.   Left main and ostial LCx obstructive disease. Moderate mid diagonal stenosis Patent LIMA to the LAD Patent SVG to OM 1 Moderately elevated LV filling pressures. LVEDP 22 mm Hg. PCWP mean 21 mm Hg Mild pulmonary HTN mean PAP 30 mm Hg Preserved cardiac output 6 L/min   Plan: optimize medical therapy for CHF. Decrease in LV function cannot be explained by ischemia.   Lexiscan stress test on October 30, 2019: There was no ST segment deviation noted during stress. Findings consistent with prior inferior myocardial infarction, there is no current ischemia. This is an intermediate risk study. Risk based on decreased LVEF, there is no current myocardium at jeopardy. Consider correlating LVEF with echo. The left ventricular ejection fraction is mildly decreased (44%).    TEE on February 28, 2016:  Left ventricle: Normal cavity size and wall thickness. LV systolic  function is low normal with an EF of 50-55%. There are no obvious wall  motion abnormalities.   Left atrium: Cavity is dilated and dilated.   Aortic valve: The valve is trileaflet. No regurgitation.   Mitral valve: Trace regurgitation.   Right ventricle: Normal cavity size, wall thickness and ejection  fraction.   Pulmonic valve: Mild regurgitation.   Left ventricle: Normal cavity size.   Vascular ultrasound Doppler pre-CABG carotid and arms on February 24, 2016: - The vertebral arteries appear patent with antegrade flow.  - Findings consistent with 1-39 percent stenosis involving the  right internal carotid artery and the left internal carotid    artery.  - Pedal artery waveforms within  normal limits.  Left heart cath on January 31, 2016: Ost Cx lesion, 75 %stenosed. LM lesion, 50 %stenosed. The left ventricular systolic function is normal. The left ventricular ejection fraction is 50-55% by visual estimate. LV end diastolic pressure is mildly elevated.   Eccentric 70-80% proximal/ostial circumflex. Circumflex territory is large. 50% stenosis in the distal left main. Codominant right coronary Overall normal LV function with mild elevation and end-diastolic pressure.   RECOMMENDATIONS:   Optimize medical therapy, although the patient is unable to use statin therapy due to side effects. Will add low-dose beta blocker therapy. Beta-blockade dosing will be compromised by relatively slow resting heart rate. Consider CABG versus higher risk PCI of the ostial circumflex in the setting of distal left main disease.Marland Kitchen  Lexiscan stress test on August 02, 2015: No diagnostic ST segment changes. Occasional PVCs noted during exercise, more frequent in recovery. Nonlimiting chest pain reported with hypertensive response. Intermediate risk Duke treadmill score of 3.5. Small, moderate intensity, mid to apical inferolateral defect that is reversible consistent with ischemia. This is an intermediate risk study. Nuclear stress EF: 37%, appears higher visually. Suggest echocardiogram to further assess.  Recent Labs: 08/05/2021: B Natriuretic Peptide 156.0; BUN 17; Creatinine, Ser 0.94; Magnesium 2.1; Platelets 332 08/09/2021: Hemoglobin 12.6; Potassium 3.4; Sodium 142  Recent Lipid Panel    Component Value Date/Time   CHOL 193 06/20/2011 1106   TRIG 250 (H) 06/20/2011 1106   HDL 41 06/20/2011 1106   CHOLHDL 4.7 06/20/2011 1106   VLDL 50 (H) 06/20/2011 1106   LDLCALC 102 (H) 06/20/2011 1106   Physical Exam:    VS:  BP (!) 145/82 (BP Location: Left Arm, Patient Position: Sitting, Cuff Size: Normal)   Pulse (!) 56   Ht '5\' 9"'$  (1.753 m)   Wt 207 lb (93.9 kg)   SpO2 94%   BMI 30.57 kg/m      Wt Readings from Last 3 Encounters:  05/11/22 207 lb (93.9 kg)  03/20/22 202 lb 3.2 oz (91.7 kg)  12/01/21 206 lb 6.4 oz (93.6 kg)     GEN: Obese, 72 y.o. Caucasian male in NAD.  HEENT: Normal NECK: No JVD; No carotid bruits CARDIAC: S1/S2, RRR, no murmurs, rubs, gallops; 2+ peripheral pulses throughout, strong and equal bilaterally RESPIRATORY:  Clear and diminished to auscultation without rales, wheezing or rhonchi  MUSCULOSKELETAL:  No edema; No deformity SKIN: Warm and dry NEUROLOGIC:  Alert and oriented x 3 PSYCHIATRIC:  Normal affect   ASSESSMENT:    1. Coronary artery disease involving native heart without angina pectoris, unspecified vessel or lesion type   2. S/P CABG x 2   3. Heart failure with mildly reduced ejection fraction (HFmrEF) ([place])   4. Medication management   5. Hyperlipidemia, unspecified hyperlipidemia type   6. PVC's (premature ventricular contractions)   7. Hypertension, unspecified type   8. History of pulmonary hypertension   9. Aortic dilatation (HCC)    PLAN:    In order of problems listed above:  CAD, s/p CABG x 2 Cardiac cath 07/2021 revealed left main and ostial left circumflex obstructive disease with moderate mid diagonal stenosis, patent LIMA to LAD, patent SVG to OM1, moderately elevated LV filling pressure with mild pulmonary hypertension, mean PAP 30 mmHg, medical therapy recommended with optimizing medical therapy for CHF. Stable with no anginal symptoms. No indication for ischemic evaluation.  Continue current  medication regimen. Heart healthy diet and regular cardiovascular exercise encouraged.    HFrEF -> HFmrEF, medication management TTE 11/2021 revealed EF 40 to 45%, global hypokinesis noted along left ventricle, mild LVH, with normal PASP, estimated RVSP was 26.2 mmHg. Last echo revealed probable LV apical trabeculation noted, consider limited study with definity contrast to better exclude mural thrombus; however, Dr. Carlyle Dolly stated apex looked fine, and did not need repeat limited Echo. Euvolemic and well compensated on exam. Low sodium diet, fluid restriction <2L, and daily weights encouraged. Educated to contact our office for weight gain of 2 lbs overnight or 5 lbs in one week.  Continue Imdur, Lasix as needed, Toprol-XL, Entresto, and Aldactone. Will obtain BMET in next 1-2 weeks. If kidney function stable, increase Entresto to max dose.   Hyperlipidemia  Labs from 03/2022 revealed LDL 86. Would like to see LDL less than 70.  Continue Crestor 40 mg daily.  Has pending orders to obtain FLP and LFT.  Plan to obtain end of this month. Heart healthy diet and regular cardiovascular exercise encouraged.   Hx of PVC's  Denies any recent palpitations or tachycardia.  He is in normal sinus rhythm on exam without any early beats detected.  Continue Toprol-XL. Heart healthy diet and regular cardiovascular exercise encouraged.   HTN Blood pressure today 140/80, repeat 145/82.  Discussed SBP goal < 130. Will obtain BMET and if kidney function allows, plan to increase Entresto to 97/103 mg twice daily. Discussed to monitor BP at home at least 2 hours after medications and sitting for 5-10 minutes. BP log given.  Continue Toprol-XL and Entresto.  Heart healthy diet and regular cardiovascular exercise encouraged.   6. Hx of pulmonary HTN Mild pulmonary hypertension noted on right and left heart cath in April 2023 with mean PAP at 30 mmHg.  Most recent echocardiogram revealed normal PASP with estimated RVSP at 26.2 mmHg.  Appears previous pulmonary HTN to be group 2 PAH, with improvement in pulmonary artery systolic pressure with CHF medical treatment.  Plan to update TTE once GDMT is fully uptitrated or sooner if clinically indicated. Heart healthy diet and regular cardiovascular exercise encouraged.  ED precautions discussed.   Aortic dilatation Most recent TTE revealed mild dilatation of ascending aorta, measuring 40 mm.   Plan to update TTE this year or sooner if indicated. Heart healthy diet and regular cardiovascular exercise encouraged.      8.  Disposition: Follow-up with Dr. Carlyle Dolly in 4-6 months or sooner if anything changes.   Medication Adjustments/Labs and Tests Ordered: Current medicines are reviewed at length with the patient today.  Concerns regarding medicines are outlined above.  Orders Placed This Encounter  Procedures   Basic metabolic panel   No orders of the defined types were placed in this encounter.   Patient Instructions  Medication Instructions:  Your physician recommends that you continue on your current medications as directed. Please refer to the Current Medication list given to you today.  Labwork: BMET (due in 1-2 weeks) [facility] ([place]. [place]) Non-fasting  Testing/Procedures: none  Follow-Up: Your physician recommends that you schedule a follow-up appointment in: 4-6 months with Dr. Harl Bowie  Any Other Special Instructions Will Be Listed Below (If Applicable).  If you need a refill on your cardiac medications before your next appointment, please call your pharmacy.    SignedFinis Bud, NP  05/11/2022 11:14 AM    [place]

## 2022-05-11 NOTE — Telephone Encounter (Signed)
Patient informed and verbalized understanding of plan. Orders mailed to home address

## 2022-05-11 NOTE — Patient Instructions (Addendum)
Medication Instructions:  Your physician recommends that you continue on your current medications as directed. Please refer to the Current Medication list given to you today.  Labwork: BMET (due in 1-2 weeks) [facility] ([place]. [place]) Non-fasting  Testing/Procedures: none  Follow-Up: Your physician recommends that you schedule a follow-up appointment in: 4-6 months with Dr. Harl Bowie  Any Other Special Instructions Will Be Listed Below (If Applicable).  If you need a refill on your cardiac medications before your next appointment, please call your pharmacy.

## 2022-05-11 NOTE — Telephone Encounter (Signed)
After visit with [facility], FLP & LFT's ordered to be done at end of January 2024 to reassess starting crestor 40 mg on 03/20/2022 per [facility].

## 2022-05-16 DIAGNOSIS — H11442 Conjunctival cysts, left eye: Secondary | ICD-10-CM | POA: Diagnosis not present

## 2022-05-16 DIAGNOSIS — H25813 Combined forms of age-related cataract, bilateral: Secondary | ICD-10-CM | POA: Diagnosis not present

## 2022-05-16 DIAGNOSIS — H524 Presbyopia: Secondary | ICD-10-CM | POA: Diagnosis not present

## 2022-05-16 DIAGNOSIS — Z7984 Long term (current) use of oral hypoglycemic drugs: Secondary | ICD-10-CM | POA: Diagnosis not present

## 2022-06-05 ENCOUNTER — Encounter: Payer: Self-pay | Admitting: *Deleted

## 2022-06-20 ENCOUNTER — Other Ambulatory Visit: Payer: Self-pay | Admitting: Nurse Practitioner

## 2022-06-26 ENCOUNTER — Telehealth: Payer: Self-pay | Admitting: *Deleted

## 2022-06-26 NOTE — Telephone Encounter (Signed)
Laurine Blazer, LPN [id number] AM EST Back to Top    Patient notified and verbalized understanding.  States that he has not done the most recent BMET requested at January visit.  These labs were from pcp in November 2023.  States he will be getting that lab done this week & will wait for that to come back prior to making the increase on the [place].  States that he just picked up a 30 day supply on his 49/'51mg'$  strength so he prefers to complete this bottle first.  States he is also working on the assistance paper work for the [employer] as his copay is $120 / 30 day supply.     Laurine Blazer, LPN [id number]  [id number] PM EST     Mailbox is full.     Finis Bud, NP 06/19/2022  5:11 PM EST     Labs stable. Please increase Entresto to max dose of 97/103 mg BID and repeat BMET in 2 weeks per protocol.   Thanks!   Finis Bud, AGNP-C

## 2022-06-27 DIAGNOSIS — G4733 Obstructive sleep apnea (adult) (pediatric): Secondary | ICD-10-CM | POA: Diagnosis not present

## 2022-07-05 DIAGNOSIS — E782 Mixed hyperlipidemia: Secondary | ICD-10-CM | POA: Diagnosis not present

## 2022-07-05 DIAGNOSIS — D509 Iron deficiency anemia, unspecified: Secondary | ICD-10-CM | POA: Diagnosis not present

## 2022-07-05 DIAGNOSIS — E1122 Type 2 diabetes mellitus with diabetic chronic kidney disease: Secondary | ICD-10-CM | POA: Diagnosis not present

## 2022-07-14 DIAGNOSIS — E1122 Type 2 diabetes mellitus with diabetic chronic kidney disease: Secondary | ICD-10-CM | POA: Diagnosis not present

## 2022-07-14 DIAGNOSIS — N138 Other obstructive and reflux uropathy: Secondary | ICD-10-CM | POA: Diagnosis not present

## 2022-07-14 DIAGNOSIS — I10 Essential (primary) hypertension: Secondary | ICD-10-CM | POA: Diagnosis not present

## 2022-07-14 DIAGNOSIS — N189 Chronic kidney disease, unspecified: Secondary | ICD-10-CM | POA: Diagnosis not present

## 2022-07-14 DIAGNOSIS — I2510 Atherosclerotic heart disease of native coronary artery without angina pectoris: Secondary | ICD-10-CM | POA: Diagnosis not present

## 2022-07-14 DIAGNOSIS — E782 Mixed hyperlipidemia: Secondary | ICD-10-CM | POA: Diagnosis not present

## 2022-07-14 DIAGNOSIS — N401 Enlarged prostate with lower urinary tract symptoms: Secondary | ICD-10-CM | POA: Diagnosis not present

## 2022-07-14 DIAGNOSIS — G4733 Obstructive sleep apnea (adult) (pediatric): Secondary | ICD-10-CM | POA: Diagnosis not present

## 2022-07-14 DIAGNOSIS — I130 Hypertensive heart and chronic kidney disease with heart failure and stage 1 through stage 4 chronic kidney disease, or unspecified chronic kidney disease: Secondary | ICD-10-CM | POA: Diagnosis not present

## 2022-07-14 DIAGNOSIS — I5022 Chronic systolic (congestive) heart failure: Secondary | ICD-10-CM | POA: Diagnosis not present

## 2022-07-14 DIAGNOSIS — E669 Obesity, unspecified: Secondary | ICD-10-CM | POA: Diagnosis not present

## 2022-07-14 DIAGNOSIS — R809 Proteinuria, unspecified: Secondary | ICD-10-CM | POA: Diagnosis not present

## 2022-07-17 ENCOUNTER — Encounter: Payer: Self-pay | Admitting: Internal Medicine

## 2022-07-26 DIAGNOSIS — G4733 Obstructive sleep apnea (adult) (pediatric): Secondary | ICD-10-CM | POA: Diagnosis not present

## 2022-07-28 ENCOUNTER — Telehealth: Payer: Self-pay | Admitting: Cardiology

## 2022-07-28 MED ORDER — ENTRESTO 97-103 MG PO TABS: 1.0000 | ORAL_TABLET | Freq: Two times a day (BID) | ORAL | 6 refills | Status: DC

## 2022-07-28 NOTE — Telephone Encounter (Signed)
Would go ahead and increase entresto to the 97/103mg  bid dose  Zandra Abts MD

## 2022-07-28 NOTE — Telephone Encounter (Signed)
Pt c/o medication issue:  1. Name of Medication: Entresto  2. How are you currently taking this medication (dosage and times per day)?   3. Are you having a reaction (difficulty breathing--STAT)?   4. What is your medication issue?  Patient said a few weeks ago, he had talked to the nurse about increasing his Entresto. He needs to know if his Delene Loll is going to be increased and does he need to see Dr  Harl Bowie before this happen.? He said either way, he needs a refill or a new prescription for his Entresto.      *STAT* If patient is at the pharmacy, call can be transferred to refill team.   1. Which medications need to be refilled? (please list name of each medication and dose if known) Entresto  2. Which pharmacy/location (including street and city if local pharmacy) is medication to be sent to? [facility] [address], [place]  3. Do they need a 30 day or 90 day supply?

## 2022-07-28 NOTE — Telephone Encounter (Signed)
Dr. Harl Bowie - please review labs scanned in on 07/05/2022 and advise if okay to make the increase based off recent labs.

## 2022-07-28 NOTE — Telephone Encounter (Signed)
Patient notified and verbalized understanding.  New rx sent to pharm as requested.

## 2022-08-26 DIAGNOSIS — G4733 Obstructive sleep apnea (adult) (pediatric): Secondary | ICD-10-CM | POA: Diagnosis not present

## 2022-09-15 ENCOUNTER — Other Ambulatory Visit: Payer: Self-pay | Admitting: Nurse Practitioner

## 2022-09-20 ENCOUNTER — Ambulatory Visit: Payer: Medicare HMO | Attending: Cardiology | Admitting: Cardiology

## 2022-09-20 VITALS — BP 128/62 | HR 61 | Ht 69.0 in | Wt 203.6 lb

## 2022-09-20 DIAGNOSIS — I10 Essential (primary) hypertension: Secondary | ICD-10-CM | POA: Diagnosis not present

## 2022-09-20 DIAGNOSIS — I5022 Chronic systolic (congestive) heart failure: Secondary | ICD-10-CM | POA: Diagnosis not present

## 2022-09-20 DIAGNOSIS — E782 Mixed hyperlipidemia: Secondary | ICD-10-CM | POA: Diagnosis not present

## 2022-09-20 DIAGNOSIS — I25119 Atherosclerotic heart disease of native coronary artery with unspecified angina pectoris: Secondary | ICD-10-CM | POA: Diagnosis not present

## 2022-09-20 MED ORDER — NITROGLYCERIN 0.4 MG SL SUBL: 0.4000 mg | SUBLINGUAL_TABLET | SUBLINGUAL | 3 refills | Status: AC | PRN

## 2022-09-20 MED ORDER — SPIRONOLACTONE 25 MG PO TABS: 25.0000 mg | ORAL_TABLET | Freq: Every day | ORAL | 3 refills | Status: DC

## 2022-09-20 NOTE — Patient Instructions (Addendum)
Medication Instructions:  Your physician recommends that you continue on your current medications as directed. Please refer to the Current Medication list given to you today.   Labwork: None  Testing/Procedures: None  Follow-Up: Your physician recommends that you schedule a follow-up appointment in: 4 Month follow up  Any Other Special Instructions Will Be Listed Below (If Applicable).  If you need a refill on your cardiac medications before your next appointment, please call your pharmacy.

## 2022-09-20 NOTE — Progress Notes (Signed)
Clinical Summary Thomas [name] is a 72 y.o.male seen today for follow up of the following medical problems.    New diagnosis of HFrEF - long history of HFpEF, most recently an echo showed new systolic dysfunction and HFrEF 07/2020 echo: LVEF 60-65%, no WMAs, grade II DD - 06/2021 echo: LVEF 30-35%, grade II dd, D septum, mild RV dysfunction, PASP 44 -07/2021 cath: ostial LM 50%, D1 70%, LCX osital 75%, RCA small vessel mild diffuse disease. LIMA-LAD patent, SVG sequential to LAD and Om1 patent LVEDP 22, mean PA 30   PCWP 21, CI 2.87 11/2021 echo: LVEF 40-45%   - toprol dosing limited due to fatigue and bradycardia.    No SOB/DOE. No recent edema - compliant with meds  - some dizziness with bending over.   - has not needed lasix      2. Hyperlipidemia - lipitor caused muscle aches. Simvastatin caused side effects. Currently on crestor 10mg  daily, tolerating.     May 19 2021 TC 148 TG 104 HDL 49 LDL 80 08/2021 TC 183 TG 154 HDL 53 LDL 103  -crestor inceased to 20mg  daily 08/2021    06/2022 TC 155 TG 292 HDL 39 LDL 69       3. CAD - history of LIMA-LAD and SVG-OM2 CABG 01/2016. LVEF 50-55% by TEE at the time - 03/2017 echo LVEF 45-50%. Grade Ii diastolic dysfunction.     08/2017 nuclear stress: PVCs, short runs NSVT. Small mild intensity mid to basal inferolatearl defect fixed most consistent with scar. LVEF 44% 10/2017 echo LVEF 50-55%, no WMAs     - metoprolol dosing limited to fatigue and bradycardia, has only been on 12.5mg  daily.    10/2019 Lexiscan nuclear stress:no ischemia.  07/2020 echo: LVEF 60-65%, grade II dd, normal RV function, mild MR      07/2021 cath: ostial LM 50%, D1 70%, LCX osital 75%, RCA small vessel mild diffuse disease. LIMA-LAD patent, SVG sequential to LAD and Om1 patent LVEDP 22, mean PA 30   PCWP 21, CI 2.87    -sharp pain midchest, lasts just a few seconds. - tends to happen rest. No other associated symptoms   4. Irregular heart beat - PVCs  noted on stress test - higher beta blocker dose causes fatigue, feels good on toprol 12.5mg  daily   - some palpitations at times. Overall mild     5. HTN - he is compliant with meds      Past Medical History:  Diagnosis Date   Acid reflux    Adenomatous colon polyp 08/2010   Anxiety    Arthritis    Chronic back pain    Coronary artery disease    Diabetes mellitus without complication (HCC)    GERD (gastroesophageal reflux disease)    Gout    Hiatal hernia    High cholesterol    HTN (hypertension)    Hyperlipidemia    Renal cyst    Large Left   Restless legs    Sleep apnea    uses cpap     Allergies  Allergen Reactions   Ambien [Zolpidem Tartrate] Other (See Comments)    Caused memory problems   Simvastatin     REACTION: arm numbness Patient currently on this medication (03/2012).     Current Outpatient Medications  Medication Sig Dispense Refill   alfuzosin (UROXATRAL) 10 MG 24 hr tablet TAKE 1 TABLET BY MOUTH EVERYDAY AT BEDTIME (Patient not taking: Reported on 05/11/2022) 90 tablet 3  amLODipine (NORVASC) 5 MG tablet Take 5 mg by mouth daily.     clotrimazole-betamethasone (LOTRISONE) cream Apply 1 application. topically daily as needed (irritation).     Empagliflozin-metFORMIN HCl ER 25-1000 MG TB24 Take 1 tablet by mouth daily.     furosemide (LASIX) 40 MG tablet TAKE 1.5 TABLETS (60 MG TOTAL) BY MOUTH DAILY AS NEEDED (FOR SWELLING, SHORTNESS OF BREATH). 135 tablet 1   gabapentin (NEURONTIN) 300 MG capsule Take 300 mg by mouth at bedtime. May take additional 300 mg if needed during the day     Iron-FA-B Cmp-C-Biot-Probiotic (FUSION PLUS) CAPS Take 1 capsule by mouth daily.     isosorbide mononitrate (IMDUR) 30 MG 24 hr tablet Take 1.5 tablets (45 mg total) by mouth daily. 45 tablet 5   meloxicam (MOBIC) 7.5 MG tablet Take 7.5 mg by mouth 2 (two) times daily.     metoprolol succinate (TOPROL-XL) 25 MG 24 hr tablet TAKE 1/2 TABLET BY MOUTH EVERY DAY 45 tablet 2    mometasone (NASONEX) 50 MCG/ACT nasal spray Place 2 sprays into the nose at bedtime as needed (for nasal congestion).     nitroGLYCERIN (NITROSTAT) 0.4 MG SL tablet Place 1 tablet (0.4 mg total) under the tongue every 5 (five) minutes as needed for chest pain. (Patient taking differently: Place 0.4 mg under the tongue every 5 (five) minutes x 3 doses as needed for chest pain.) 25 tablet 3   pantoprazole (PROTONIX) 40 MG tablet Take 1 tablet (40 mg total) by mouth daily. 90 tablet 1   rosuvastatin (CRESTOR) 40 MG tablet TAKE 1 TABLET BY MOUTH EVERY DAY 90 tablet 1   sacubitril-valsartan (ENTRESTO) 97-103 MG Take 1 tablet by mouth 2 (two) times daily. 60 tablet 6   spironolactone (ALDACTONE) 25 MG tablet Take 1 tablet (25 mg total) by mouth daily. 90 tablet 3   Current Facility-Administered Medications  Medication Dose Route Frequency Provider Last Rate Last Admin   sodium chloride flush (NS) 0.9 % injection 3 mL  3 mL Intravenous Q12H Antoine Poche, MD         Past Surgical History:  Procedure Laterality Date   APPENDECTOMY  1967   CARDIAC CATHETERIZATION N/A 01/31/2016   Procedure: Left Heart Cath and Coronary Angiography;  Surgeon: Lyn Records, MD;  Location: [facility] INVASIVE CV LAB;  Service: Cardiovascular;  Laterality: N/A;   CARDIOVASCULAR STRESS TEST  2004   Treadmill Stress Test   CARDIOVASCULAR STRESS TEST  2010   CHOLECYSTECTOMY  07/2006   COLONOSCOPY  08/2010   RMR: Cecal tubular adenoma removed, otherwise normal exam. next tcs 08/2015   CORONARY ARTERY BYPASS GRAFT N/A 02/28/2016   Procedure: CORONARY ARTERY BYPASS GRAFTING x two using left internal mammary artery and right leg greater saphenous vein harvested endoscopically;  Surgeon: Alleen Borne, MD;  Location: [facility] OR;  Service: Open Heart Surgery;  Laterality: N/A;   KNEE SURGERY  1995   Micro   RIGHT/LEFT HEART CATH AND CORONARY/GRAFT ANGIOGRAPHY N/A 08/09/2021   Procedure: RIGHT/LEFT HEART CATH AND CORONARY/GRAFT  ANGIOGRAPHY;  Surgeon: [place]az[place], Peter M, MD;  Location: [facility] INVASIVE CV LAB;  Service: Cardiovascular;  Laterality: N/A;   TEE WITHOUT CARDIOVERSION N/A 02/28/2016   Procedure: TRANSESOPHAGEAL ECHOCARDIOGRAM (TEE);  Surgeon: Alleen Borne, MD;  Location: [facility] OR;  Service: Open Heart Surgery;  Laterality: N/A;   UPPER GASTROINTESTINAL ENDOSCOPY  08/2010   RMR: GERD benign biopsy   VASECTOMY       Allergies  Allergen Reactions  Ambien [Zolpidem Tartrate] Other (See Comments)    Caused memory problems   Simvastatin     REACTION: arm numbness Patient currently on this medication (03/2012).      Family History  Problem Relation Age of Onset   Lymphoma Mother    Cancer Mother    Ulcers Father    Cancer Father    Ulcers Paternal Grandfather        Stomach ulcer, resection   Heart attack Other    Diabetes Other      Social History Mr. Mcosker reports that he has never smoked. He has never been exposed to tobacco smoke. He has never used smokeless tobacco. Mr. Sheffler reports current alcohol use of about 2.0 standard drinks of alcohol per week.   Review of Systems CONSTITUTIONAL: No weight loss, fever, chills, weakness or fatigue.  HEENT: Eyes: No visual loss, blurred vision, double vision or yellow sclerae.No hearing loss, sneezing, congestion, runny nose or sore throat.  SKIN: No rash or itching.  CARDIOVASCULAR: per hpi RESPIRATORY: No shortness of breath, cough or sputum.  GASTROINTESTINAL: No anorexia, nausea, vomiting or diarrhea. No abdominal pain or blood.  GENITOURINARY: No burning on urination, no polyuria NEUROLOGICAL: No headache, dizziness, syncope, paralysis, ataxia, numbness or tingling in the extremities. No change in bowel or bladder control.  MUSCULOSKELETAL: No muscle, back pain, joint pain or stiffness.  LYMPHATICS: No enlarged nodes. No history of splenectomy.  PSYCHIATRIC: No history of depression or anxiety.  ENDOCRINOLOGIC: No reports of sweating, cold or  heat intolerance. No polyuria or polydipsia.  Marland Kitchen   Physical Examination Today's Vitals   09/20/22 1405  BP: 128/62  Pulse: 61  Weight: 203 lb 9.6 oz (92.4 kg)  Height: 5\' 9"  (1.753 m)   Body mass index is 30.07 kg/m.  Gen: resting comfortably, no acute distress HEENT: no scleral icterus, pupils equal round and reactive, no palptable cervical adenopathy,  CV: RRR, no m/rg, no jvd Resp: Clear to auscultation bilaterally GI: abdomen is soft, non-tender, non-distended, normal bowel sounds, no hepatosplenomegaly MSK: extremities are warm, no edema.  Skin: warm, no rash Neuro:  no focal deficits Psych: appropriate affect   Diagnostic Studies  10/2019 nuclear stress There was no ST segment deviation noted during stress. Findings consistent with prior inferior myocardial infarction, there is no current ischemia. This is an intermediate risk study. Risk based on decreased LVEF, there is no current myocardium at jeopardy. Consider correlating LVEF with echo. The left ventricular ejection fraction is mildly decreased (44%).     07/2020 echo 1. Left ventricular ejection fraction, by estimation, is 60 to 65%. The  left ventricle has normal function. The left ventricle has no regional  wall motion abnormalities. Left ventricular diastolic parameters are  consistent with Grade II diastolic  dysfunction (pseudonormalization).   2. Right ventricular systolic function is normal. The right ventricular  size is normal. There is mildly elevated pulmonary artery systolic  pressure.   3. Left atrial size was mild to moderately dilated.   4. The mitral valve is normal in structure. Mild mitral valve  regurgitation.   5. The aortic valve is normal in structure. Aortic valve regurgitation is  not visualized.   6. The inferior vena cava is dilated in size with >50% respiratory  variability, suggesting right atrial pressure of 8 mmHg.    07/2021 cath Ost LM to Mid LM lesion is 50% stenosed.    1st Diag lesion is 70% stenosed.   Ost Cx lesion is 75% stenosed.  Prox Graft to Mid Graft lesion between Ost LAD and 1st Mrg  is 35% stenosed.   LIMA graft was visualized by angiography and is normal in caliber.   SVG graft was visualized by angiography and is normal in caliber.   The graft exhibits no disease.   LV end diastolic pressure is moderately elevated.   Hemodynamic findings consistent with mild pulmonary hypertension.   Left main and ostial LCx obstructive disease. Moderate mid diagonal stenosis Patent LIMA to the LAD Patent SVG to OM 1 Moderately elevated LV filling pressures. LVEDP 22 mm Hg. PCWP mean 21 mm Hg Mild pulmonary HTN mean PAP 30 mm Hg Preserved cardiac output 6 L/min   Plan: optimize medical therapy for CHF. Decrease in LV function cannot be explained by ischemia.    11/2021 echo 1. Left ventricular ejection fraction, by estimation, is 40 to 45%. The  left ventricle has mildly decreased function. The left ventricle  demonstrates global hypokinesis. There is mild left ventricular  hypertrophy. Left ventricular diastolic parameters  are indeterminate. The average left ventricular global longitudinal strain  is -17.5 %. The global longitudinal strain is normal.   2. Probable LV apical trabeculation noted, consider limited study with  Definity contrast to better exclude mural thrombus.   3. Right ventricular systolic function is normal. The right ventricular  size is normal. There is normal pulmonary artery systolic pressure. The  estimated right ventricular systolic pressure is 26.2 mmHg.   4. Left atrial size was upper normal.   5. Right atrial size was mildly dilated.   6. The mitral valve is grossly normal. Mild mitral valve regurgitation.   7. The aortic valve is tricuspid. Aortic valve regurgitation is trivial.   8. Aortic dilatation noted. There is mild dilatation of the ascending  aorta, measuring 40 mm.   9. The inferior vena cava is normal in size  with greater than 50%  respiratory variability, suggesting right atrial pressure of 3 mmHg.   Assessment and Plan   1. HFrEF - long history of HFpEF, new diagnosis of HFrEF based on 06/2021 echo - cath without new disease -LVEF trending up, most recent 40-45%. Will repeat echo after next visit -EKG show NSR  - some orthostatic dizziness, not using lasix. Discussed increasing oral hydration  2. HTN -at goal, continue current meds   3. Hyperlipidemia - LDL is at goal, continue current meds. Discussed dietary changers to lower TGs  4. CAD - no cardiac symptoms, continue current meds   Antoine Poche, M.D.

## 2022-10-09 ENCOUNTER — Other Ambulatory Visit: Payer: Self-pay | Admitting: Nurse Practitioner

## 2022-10-09 ENCOUNTER — Other Ambulatory Visit: Payer: Self-pay | Admitting: Cardiology

## 2022-11-06 DIAGNOSIS — E782 Mixed hyperlipidemia: Secondary | ICD-10-CM | POA: Diagnosis not present

## 2022-11-06 DIAGNOSIS — E1122 Type 2 diabetes mellitus with diabetic chronic kidney disease: Secondary | ICD-10-CM | POA: Diagnosis not present

## 2022-11-06 DIAGNOSIS — D509 Iron deficiency anemia, unspecified: Secondary | ICD-10-CM | POA: Diagnosis not present

## 2022-11-10 ENCOUNTER — Encounter: Payer: Self-pay | Admitting: Internal Medicine

## 2022-11-10 DIAGNOSIS — E782 Mixed hyperlipidemia: Secondary | ICD-10-CM | POA: Diagnosis not present

## 2022-11-10 DIAGNOSIS — R600 Localized edema: Secondary | ICD-10-CM | POA: Diagnosis not present

## 2022-11-10 DIAGNOSIS — I5022 Chronic systolic (congestive) heart failure: Secondary | ICD-10-CM | POA: Diagnosis not present

## 2022-11-10 DIAGNOSIS — G4733 Obstructive sleep apnea (adult) (pediatric): Secondary | ICD-10-CM | POA: Diagnosis not present

## 2022-11-10 DIAGNOSIS — R809 Proteinuria, unspecified: Secondary | ICD-10-CM | POA: Diagnosis not present

## 2022-11-10 DIAGNOSIS — N189 Chronic kidney disease, unspecified: Secondary | ICD-10-CM | POA: Diagnosis not present

## 2022-11-10 DIAGNOSIS — I2510 Atherosclerotic heart disease of native coronary artery without angina pectoris: Secondary | ICD-10-CM | POA: Diagnosis not present

## 2022-11-10 DIAGNOSIS — N401 Enlarged prostate with lower urinary tract symptoms: Secondary | ICD-10-CM | POA: Diagnosis not present

## 2022-11-10 DIAGNOSIS — I110 Hypertensive heart disease with heart failure: Secondary | ICD-10-CM | POA: Diagnosis not present

## 2022-11-10 DIAGNOSIS — K219 Gastro-esophageal reflux disease without esophagitis: Secondary | ICD-10-CM | POA: Diagnosis not present

## 2022-11-10 DIAGNOSIS — E1122 Type 2 diabetes mellitus with diabetic chronic kidney disease: Secondary | ICD-10-CM | POA: Diagnosis not present

## 2022-11-10 DIAGNOSIS — I10 Essential (primary) hypertension: Secondary | ICD-10-CM | POA: Diagnosis not present

## 2022-11-27 ENCOUNTER — Other Ambulatory Visit: Payer: Self-pay | Admitting: Cardiology

## 2022-12-26 ENCOUNTER — Other Ambulatory Visit: Payer: Self-pay | Admitting: Cardiology

## 2023-01-24 DIAGNOSIS — M79645 Pain in left finger(s): Secondary | ICD-10-CM | POA: Diagnosis not present

## 2023-01-27 DIAGNOSIS — G4733 Obstructive sleep apnea (adult) (pediatric): Secondary | ICD-10-CM | POA: Diagnosis not present

## 2023-01-29 ENCOUNTER — Ambulatory Visit: Payer: Medicare HMO

## 2023-01-29 ENCOUNTER — Ambulatory Visit: Payer: Medicare HMO | Attending: Cardiology | Admitting: Cardiology

## 2023-01-29 ENCOUNTER — Encounter: Payer: Self-pay | Admitting: Cardiology

## 2023-01-29 VITALS — BP 108/60 | HR 56 | Ht 69.0 in | Wt 203.6 lb

## 2023-01-29 DIAGNOSIS — Z0100 Encounter for examination of eyes and vision without abnormal findings: Secondary | ICD-10-CM | POA: Diagnosis not present

## 2023-01-29 DIAGNOSIS — E782 Mixed hyperlipidemia: Secondary | ICD-10-CM | POA: Diagnosis not present

## 2023-01-29 DIAGNOSIS — R002 Palpitations: Secondary | ICD-10-CM | POA: Diagnosis not present

## 2023-01-29 DIAGNOSIS — I5022 Chronic systolic (congestive) heart failure: Secondary | ICD-10-CM | POA: Diagnosis not present

## 2023-01-29 DIAGNOSIS — I25119 Atherosclerotic heart disease of native coronary artery with unspecified angina pectoris: Secondary | ICD-10-CM

## 2023-01-29 DIAGNOSIS — I10 Essential (primary) hypertension: Secondary | ICD-10-CM

## 2023-01-29 MED ORDER — ASPIRIN 81 MG PO TBEC: 81.0000 mg | DELAYED_RELEASE_TABLET | Freq: Every day | ORAL | Status: AC

## 2023-01-29 NOTE — Progress Notes (Signed)
Clinical Summary Thomas [name] is a 72 y.o.male seen today for follow up of the following medical problems.      Chronic HFrEF - long history of HFpEF, most recently an echo showed new systolic dysfunction and HFrEF 07/2020 echo: LVEF 60-65%, no WMAs, grade II DD  - 06/2021 echo: LVEF 30-35%, grade II dd, D septum, mild RV dysfunction, PASP 44 -07/2021 cath: ostial LM 50%, D1 70%, LCX osital 75%, RCA small vessel mild diffuse disease. LIMA-LAD patent, SVG sequential to LAD and Om1 patent LVEDP 22, mean PA 30   PCWP 21, CI 2.87  11/2021 echo: LVEF 40-45%  - toprol dosing limited due to fatigue and bradycardia.      - some SOB at times with high levels of exertion.  - no recent edema. Has prn lasix, has not needed - home weights low 200s and stable - compliant with meds    2. Hyperlipidemia - lipitor caused muscle aches. Simvastatin caused side effects. Currently on crestor 10mg  daily, tolerating.     May 19 2021 TC 148 TG 104 HDL 49 LDL 80 08/2021 TC 183 TG 154 HDL 53 LDL 103  -crestor inceased to 20mg  daily 08/2021    06/2022 TC 155 TG 292 HDL 39 LDL 69    - he is on rosuvastatin 77m, zetia 10mg  daily - 10/2022 TC 165 TG 195 HDL 46 LDL 86/ PCP added zetia at that time.   3. CAD - history of LIMA-LAD and SVG-OM2 CABG 01/2016. LVEF 50-55% by TEE at the time - 03/2017 echo LVEF 45-50%. Grade Ii diastolic dysfunction.     08/2017 nuclear stress: PVCs, short runs NSVT. Small mild intensity mid to basal inferolatearl defect fixed most consistent with scar. LVEF 44% 10/2017 echo LVEF 50-55%, no WMAs     - metoprolol dosing limited to fatigue and bradycardia, has only been on 12.5mg  daily.    10/2019 Lexiscan nuclear stress:no ischemia.  07/2020 echo: LVEF 60-65%, grade II dd, normal RV function, mild MR      07/2021 cath: ostial LM 50%, D1 70%, LCX osital 75%, RCA small vessel mild diffuse disease. LIMA-LAD patent, SVG sequential to LAD and Om1 patent LVEDP 22, mean PA 30   PCWP  21, CI 2.87    -sharp pain midchest, lasts just a few seconds. Can have some palpitations associated.    4. Irregular heart beat - PVCs noted on stress test - higher beta blocker dose causes fatigue, feels good on toprol 12.5mg  daily   - some recent palpitations, can have sharp pain associated.      5. HTN - he is compliant with meds Past Medical History:  Diagnosis Date   Acid reflux    Adenomatous colon polyp 08/2010   Anxiety    Arthritis    Chronic back pain    Coronary artery disease    Diabetes mellitus without complication (HCC)    GERD (gastroesophageal reflux disease)    Gout    Hiatal hernia    High cholesterol    HTN (hypertension)    Hyperlipidemia    Renal cyst    Large Left   Restless legs    Sleep apnea    uses cpap     Allergies  Allergen Reactions   Ambien [Zolpidem Tartrate] Other (See Comments)    Caused memory problems   Simvastatin     REACTION: arm numbness Patient currently on this medication (03/2012).     Current Outpatient Medications  Medication  Sig Dispense Refill   amLODipine (NORVASC) 5 MG tablet TAKE 1 TABLET (5 MG TOTAL) BY MOUTH DAILY. 90 tablet 3   aspirin EC 81 MG tablet Take 1 tablet (81 mg total) by mouth daily. Swallow whole.     clotrimazole-betamethasone (LOTRISONE) cream Apply 1 application. topically daily as needed (irritation).     Empagliflozin-metFORMIN HCl ER 25-1000 MG TB24 Take 1 tablet by mouth daily.     ezetimibe (ZETIA) 10 MG tablet Take 10 mg by mouth daily.     gabapentin (NEURONTIN) 300 MG capsule Take 300 mg by mouth at bedtime. May take additional 300 mg if needed during the day     isosorbide mononitrate (IMDUR) 30 MG 24 hr tablet TAKE 1.5 TABLETS BY MOUTH DAILY. 135 tablet 1   levocetirizine (XYZAL) 5 MG tablet Take 5 mg by mouth daily.     metoprolol succinate (TOPROL-XL) 25 MG 24 hr tablet TAKE 1/2 TABLET BY MOUTH DAILY 45 tablet 2   mometasone (NASONEX) 50 MCG/ACT nasal spray Place 2 sprays into the  nose at bedtime as needed (for nasal congestion).     nitroGLYCERIN (NITROSTAT) 0.4 MG SL tablet Place 1 tablet (0.4 mg total) under the tongue every 5 (five) minutes x 3 doses as needed for chest pain (If no relief after 3rd dose proceed to ED or call 911). 25 tablet 3   pantoprazole (PROTONIX) 40 MG tablet TAKE 1 TABLET BY MOUTH EVERY DAY 90 tablet 1   rosuvastatin (CRESTOR) 40 MG tablet TAKE 1 TABLET BY MOUTH EVERY DAY 90 tablet 1   sacubitril-valsartan (ENTRESTO) 97-103 MG Take 1 tablet by mouth 2 (two) times daily. 60 tablet 6   spironolactone (ALDACTONE) 25 MG tablet Take 1 tablet (25 mg total) by mouth daily. 90 tablet 3   alfuzosin (UROXATRAL) 10 MG 24 hr tablet TAKE 1 TABLET BY MOUTH EVERYDAY AT BEDTIME (Patient not taking: Reported on 01/29/2023) 90 tablet 3   furosemide (LASIX) 40 MG tablet TAKE 1.5 TABLETS (60 MG TOTAL) BY MOUTH DAILY AS NEEDED (FOR SWELLING, SHORTNESS OF BREATH). (Patient not taking: Reported on 09/20/2022) 135 tablet 1   Iron-FA-B Cmp-C-Biot-Probiotic (FUSION PLUS) CAPS Take 1 capsule by mouth daily. (Patient not taking: Reported on 01/29/2023)     meloxicam (MOBIC) 7.5 MG tablet Take 7.5 mg by mouth 2 (two) times daily. (Patient not taking: Reported on 09/20/2022)     Current Facility-Administered Medications  Medication Dose Route Frequency Provider Last Rate Last Admin   sodium chloride flush (NS) 0.9 % injection 3 mL  3 mL Intravenous Q12H Antoine Poche, MD         Past Surgical History:  Procedure Laterality Date   APPENDECTOMY  1967   CARDIAC CATHETERIZATION N/A 01/31/2016   Procedure: Left Heart Cath and Coronary Angiography;  Surgeon: Lyn Records, MD;  Location: [facility] INVASIVE CV LAB;  Service: Cardiovascular;  Laterality: N/A;   CARDIOVASCULAR STRESS TEST  2004   Treadmill Stress Test   CARDIOVASCULAR STRESS TEST  2010   CHOLECYSTECTOMY  07/2006   COLONOSCOPY  08/2010   RMR: Cecal tubular adenoma removed, otherwise normal exam. next tcs 08/2015    CORONARY ARTERY BYPASS GRAFT N/A 02/28/2016   Procedure: CORONARY ARTERY BYPASS GRAFTING x two using left internal mammary artery and right leg greater saphenous vein harvested endoscopically;  Surgeon: Alleen Borne, MD;  Location: [facility] OR;  Service: Open Heart Surgery;  Laterality: N/A;   KNEE SURGERY  1995   Micro   RIGHT/LEFT  HEART CATH AND CORONARY/GRAFT ANGIOGRAPHY N/A 08/09/2021   Procedure: RIGHT/LEFT HEART CATH AND CORONARY/GRAFT ANGIOGRAPHY;  Surgeon: Swazil[place], Peter M, MD;  Location: [facility] INVASIVE CV LAB;  Service: Cardiovascular;  Laterality: N/A;   TEE WITHOUT CARDIOVERSION N/A 02/28/2016   Procedure: TRANSESOPHAGEAL ECHOCARDIOGRAM (TEE);  Surgeon: Alleen Borne, MD;  Location: [facility] OR;  Service: Open Heart Surgery;  Laterality: N/A;   UPPER GASTROINTESTINAL ENDOSCOPY  08/2010   RMR: GERD benign biopsy   VASECTOMY       Allergies  Allergen Reactions   Ambien [Zolpidem Tartrate] Other (See Comments)    Caused memory problems   Simvastatin     REACTION: arm numbness Patient currently on this medication (03/2012).      Family History  Problem Relation Age of Onset   Lymphoma Mother    Cancer Mother    Ulcers Father    Cancer Father    Ulcers Paternal Grandfather        Stomach ulcer, resection   Heart attack Other    Diabetes Other      Social History Mr. Manley reports that he has never smoked. He has never been exposed to tobacco smoke. He has never used smokeless tobacco. Mr. Lundahl reports current alcohol use of about 2.0 standard drinks of alcohol per week.   Review of Systems CONSTITUTIONAL: No weight loss, fever, chills, weakness or fatigue.  HEENT: Eyes: No visual loss, blurred vision, double vision or yellow sclerae.No hearing loss, sneezing, congestion, runny nose or sore throat.  SKIN: No rash or itching.  CARDIOVASCULAR: per hpi RESPIRATORY: No shortness of breath, cough or sputum.  GASTROINTESTINAL: No anorexia, nausea, vomiting or diarrhea. No abdominal  pain or blood.  GENITOURINARY: No burning on urination, no polyuria NEUROLOGICAL: No headache, dizziness, syncope, paralysis, ataxia, numbness or tingling in the extremities. No change in bowel or bladder control.  MUSCULOSKELETAL: No muscle, back pain, joint pain or stiffness.  LYMPHATICS: No enlarged nodes. No history of splenectomy.  PSYCHIATRIC: No history of depression or anxiety.  ENDOCRINOLOGIC: No reports of sweating, cold or heat intolerance. No polyuria or polydipsia.  Marland Kitchen   Physical Examination Today's Vitals   01/29/23 0956  BP: 108/60  Pulse: (!) 56  SpO2: 97%  Weight: 203 lb 9.6 oz (92.4 kg)  Height: 5\' 9"  (1.753 m)   Body mass index is 30.07 kg/m.  Gen: resting comfortably, no acute distress HEENT: no scleral icterus, pupils equal round and reactive, no palptable cervical adenopathy,  CV: RRR, no m/rg, no jvd Resp: Clear to auscultation bilaterally GI: abdomen is soft, non-tender, non-distended, normal bowel sounds, no hepatosplenomegaly MSK: extremities are warm, no edema.  Skin: warm, no rash Neuro:  no focal deficits Psych: appropriate affect   Diagnostic Studies  10/2019 nuclear stress There was no ST segment deviation noted during stress. Findings consistent with prior inferior myocardial infarction, there is no current ischemia. This is an intermediate risk study. Risk based on decreased LVEF, there is no current myocardium at jeopardy. Consider correlating LVEF with echo. The left ventricular ejection fraction is mildly decreased (44%).     07/2020 echo 1. Left ventricular ejection fraction, by estimation, is 60 to 65%. The  left ventricle has normal function. The left ventricle has no regional  wall motion abnormalities. Left ventricular diastolic parameters are  consistent with Grade II diastolic  dysfunction (pseudonormalization).   2. Right ventricular systolic function is normal. The right ventricular  size is normal. There is mildly elevated  pulmonary artery systolic  pressure.   3. Left atrial size was mild to moderately dilated.   4. The mitral valve is normal in structure. Mild mitral valve  regurgitation.   5. The aortic valve is normal in structure. Aortic valve regurgitation is  not visualized.   6. The inferior vena cava is dilated in size with >50% respiratory  variability, suggesting right atrial pressure of 8 mmHg.    07/2021 cath Ost LM to Mid LM lesion is 50% stenosed.   1st Diag lesion is 70% stenosed.   Ost Cx lesion is 75% stenosed.   Prox Graft to Mid Graft lesion between Ost LAD and 1st Mrg  is 35% stenosed.   LIMA graft was visualized by angiography and is normal in caliber.   SVG graft was visualized by angiography and is normal in caliber.   The graft exhibits no disease.   LV end diastolic pressure is moderately elevated.   Hemodynamic findings consistent with mild pulmonary hypertension.   Left main and ostial LCx obstructive disease. Moderate mid diagonal stenosis Patent LIMA to the LAD Patent SVG to OM 1 Moderately elevated LV filling pressures. LVEDP 22 mm Hg. PCWP mean 21 mm Hg Mild pulmonary HTN mean PAP 30 mm Hg Preserved cardiac output 6 L/min   Plan: optimize medical therapy for CHF. Decrease in LV function cannot be explained by ischemia.      11/2021 echo 1. Left ventricular ejection fraction, by estimation, is 40 to 45%. The  left ventricle has mildly decreased function. The left ventricle  demonstrates global hypokinesis. There is mild left ventricular  hypertrophy. Left ventricular diastolic parameters  are indeterminate. The average left ventricular global longitudinal strain  is -17.5 %. The global longitudinal strain is normal.   2. Probable LV apical trabeculation noted, consider limited study with  Definity contrast to better exclude mural thrombus.   3. Right ventricular systolic function is normal. The right ventricular  size is normal. There is normal pulmonary artery  systolic pressure. The  estimated right ventricular systolic pressure is 26.2 mmHg.   4. Left atrial size was upper normal.   5. Right atrial size was mildly dilated.   6. The mitral valve is grossly normal. Mild mitral valve regurgitation.   7. The aortic valve is tricuspid. Aortic valve regurgitation is trivial.   8. Aortic dilatation noted. There is mild dilatation of the ascending  aorta, measuring 40 mm.   9. The inferior vena cava is normal in size with greater than 50%  respiratory variability, suggesting right atrial pressure of 3 mmHg.      Assessment and Plan   1.Chronic HFrEF - long history of HFpEF, new diagnosis of HFrEF based on 06/2021 echo - cath without new disease -LVEF trending up, most recent 40-45%.  - Repeat echo reassess LVEF - continue current meds   2. HTN -he is at goal, continue current meds   3. Hyperlipidemia - pcp recently added zetia 10mg  to his crestor 40mg  daily for elevated LDL, follow lipids over time.    4. CAD - no symptoms, asked to add asa 81mg  daily to his regimen  5. Palpitations - nonspecific sharp chest pain that can be associated with palpitations.  - has had some PVCs noted on prior stress tests - obtain 2 week zio patch see if association between PVCs and associated symptoms.       Antoine Poche, M.D.

## 2023-01-29 NOTE — Patient Instructions (Signed)
Medication [name]:  Your physician has recommended you make the following change in your medication:   -Start Aspirin 81 mg tablets once daily.   *If you need a refill on your cardiac medications before your next appointment, please call your pharmacy*   Lab Work: None If you have labs (blood work) drawn today and your tests are completely normal, you will receive your results only by: MyChart Message (if you have MyChart) OR A paper copy in the mail If you have any lab test that is abnormal or we need to change your treatment, we will call you to review the results.   Testing/Procedures: Your physician has requested that you have an echocardiogram. Echocardiography is a painless test that uses sound waves to create images of your heart. It provides your doctor with information about the size and shape of your heart and how well your heart's chambers and valves are working. This procedure takes approximately one hour. There are no restrictions for this procedure. Please do NOT wear cologne, perfume, aftershave, or lotions (deodorant is allowed). Please arrive 15 minutes prior to your appointment time.    Follow-Up: At [facility], you and your health needs are our priority.  As part of our continuing mission to provide you with exceptional heart care, we have created designated Provider Care Teams.  These Care Teams include your primary Cardiologist (physician) and Advanced Practice Providers (APPs -  Physician Assistants and Nurse Practitioners) who all work together to provide you with the care you need, when you need it.  We recommend signing up for the patient portal called "MyChart".  Sign up information is provided on this After Visit Summary.  MyChart is used to connect with patients for Virtual Visits (Telemedicine).  Patients are able to view lab/test results, encounter notes, upcoming appointments, etc.  Non-urgent messages can be sent to your provider as well.    To learn more about what you can do with MyChart, go to [email or web address].    Your next appointment:   6 week(s)  Provider:   You may see Dina Rich, MD or the following Advanced Practice Provider on your designated Care Team:   Sharlene Dory, NP    Other [name] [name] [name]   Your physician has requested you wear your ZIO patch monitor____14___days.   This is a single patch monitor.  [employer] supplies one patch monitor per enrollment.  Additional stickers are not available.   Please do not apply patch if you will be having a Nuclear Stress Test, Echocardiogram, Cardiac CT, MRI, or Chest Xray during the time frame you would be wearing the monitor. The patch cannot be worn during these tests.  You cannot remove and re-apply the ZIO XT patch monitor.   Your ZIO patch monitor will be sent USPS Priority mail from [facility] directly to your home address. The monitor may also be mailed to a PO BOX if home delivery is not available.   It may take 3-5 days to receive your monitor after you have been enrolled.   Once you have received you monitor, please review enclosed [name]  Your monitor has already been registered assigning a specific monitor serial # to you.   Applying the monitor   Shave hair from upper left chest.   Hold abrader disc by orange tab.  Rub abrader in 40 strokes over left upper chest as indicated in your monitor [name]   Clean area with 4 enclosed alcohol pads .  Use  all pads to assure are is cleaned thoroughly.  Let dry.   Apply patch as indicated in monitor [name]  Patch will be place under collarbone on left side of chest with arrow pointing upward.   Rub patch adhesive wings for 2 minutes.Remove white label marked "1".  Remove white label marked "2".  Rub patch adhesive wings for 2 additional minutes.   While looking in a mirror, press and release button in center of patch.  A small green  light will flash 3-4 times .  This will be your only indicator the monitor has been turned on.     Do not shower for the first 24 hours.  You may shower after the first 24 hours.   Press button if you feel a symptom. You will hear a small click.  Record Date, Time and Symptom in the Patient Log Book.   When you are ready to remove patch, follow [name] on last 2 pages of Patient Log Book.  Stick patch monitor onto last page of Patient Log Book.   Place Patient Log Book in [place] box.  Use locking tab on box and tape box closed securely.  The [employer] and [employer] has [employer] on it.  Please place in mailbox as soon as possible.  Your physician should have your test results approximately 7 days after the monitor has been mailed back to [facility].   Call [facility] Customer Care at [phone] if you have questions regarding your ZIO XT patch monitor.  Call them immediately if you see an orange light blinking on your monitor.   If your monitor falls off in less than 4 days contact our Monitor department at [phone].  If your monitor becomes loose or falls off after 4 days call [employer] at [phone] for suggestions on securing your monitor.

## 2023-02-08 ENCOUNTER — Ambulatory Visit: Payer: Medicare HMO | Attending: Cardiology

## 2023-02-08 DIAGNOSIS — I5022 Chronic systolic (congestive) heart failure: Secondary | ICD-10-CM

## 2023-02-12 LAB — ECHOCARDIOGRAM COMPLETE
AR max vel: 3.37 cm2
AV Area VTI: 3.35 cm2
AV Area mean vel: 3.42 cm2
AV Mean grad: 5 mm[Hg]
AV Peak grad: 9.2 mm[Hg]
Ao pk vel: 1.52 m/s
Area-P 1/2: 3.91 cm2
Calc EF: 55.7 %
MV VTI: 3.44 cm2
S' Lateral: 3.3 cm
Single Plane A2C EF: 50.4 %
Single Plane A4C EF: 58.3 %

## 2023-02-20 DIAGNOSIS — R002 Palpitations: Secondary | ICD-10-CM | POA: Diagnosis not present

## 2023-03-12 ENCOUNTER — Ambulatory Visit: Payer: Medicare HMO | Admitting: Nurse Practitioner

## 2023-03-21 DIAGNOSIS — E782 Mixed hyperlipidemia: Secondary | ICD-10-CM | POA: Diagnosis not present

## 2023-03-21 DIAGNOSIS — E1122 Type 2 diabetes mellitus with diabetic chronic kidney disease: Secondary | ICD-10-CM | POA: Diagnosis not present

## 2023-03-21 DIAGNOSIS — Z125 Encounter for screening for malignant neoplasm of prostate: Secondary | ICD-10-CM | POA: Diagnosis not present

## 2023-03-21 DIAGNOSIS — D509 Iron deficiency anemia, unspecified: Secondary | ICD-10-CM | POA: Diagnosis not present

## 2023-03-23 ENCOUNTER — Other Ambulatory Visit: Payer: Self-pay | Admitting: Nurse Practitioner

## 2023-03-23 DIAGNOSIS — I2510 Atherosclerotic heart disease of native coronary artery without angina pectoris: Secondary | ICD-10-CM | POA: Diagnosis not present

## 2023-03-23 DIAGNOSIS — E1165 Type 2 diabetes mellitus with hyperglycemia: Secondary | ICD-10-CM | POA: Diagnosis not present

## 2023-03-23 DIAGNOSIS — E782 Mixed hyperlipidemia: Secondary | ICD-10-CM | POA: Diagnosis not present

## 2023-03-23 DIAGNOSIS — Z0001 Encounter for general adult medical examination with abnormal findings: Secondary | ICD-10-CM | POA: Diagnosis not present

## 2023-03-23 DIAGNOSIS — K219 Gastro-esophageal reflux disease without esophagitis: Secondary | ICD-10-CM | POA: Diagnosis not present

## 2023-03-23 DIAGNOSIS — N342 Other urethritis: Secondary | ICD-10-CM | POA: Diagnosis not present

## 2023-03-23 DIAGNOSIS — I10 Essential (primary) hypertension: Secondary | ICD-10-CM | POA: Diagnosis not present

## 2023-03-23 DIAGNOSIS — I5022 Chronic systolic (congestive) heart failure: Secondary | ICD-10-CM | POA: Diagnosis not present

## 2023-03-23 DIAGNOSIS — I110 Hypertensive heart disease with heart failure: Secondary | ICD-10-CM | POA: Diagnosis not present

## 2023-03-23 DIAGNOSIS — E1122 Type 2 diabetes mellitus with diabetic chronic kidney disease: Secondary | ICD-10-CM | POA: Diagnosis not present

## 2023-03-23 DIAGNOSIS — R7401 Elevation of levels of liver transaminase levels: Secondary | ICD-10-CM | POA: Diagnosis not present

## 2023-03-23 DIAGNOSIS — N189 Chronic kidney disease, unspecified: Secondary | ICD-10-CM | POA: Diagnosis not present

## 2023-03-27 ENCOUNTER — Other Ambulatory Visit: Payer: Self-pay | Admitting: Cardiology

## 2023-03-30 ENCOUNTER — Other Ambulatory Visit: Payer: Self-pay | Admitting: Nurse Practitioner

## 2023-04-06 ENCOUNTER — Telehealth: Payer: Self-pay | Admitting: Cardiology

## 2023-04-06 ENCOUNTER — Encounter: Payer: Self-pay | Admitting: *Deleted

## 2023-04-06 NOTE — Telephone Encounter (Signed)
Attempted to return call - left detailed message that I will leave results on mychart.

## 2023-04-06 NOTE — Telephone Encounter (Signed)
Patient returned RN's call regarding heart monitor results.

## 2023-04-12 ENCOUNTER — Telehealth: Payer: Self-pay | Admitting: *Deleted

## 2023-04-12 DIAGNOSIS — R002 Palpitations: Secondary | ICD-10-CM

## 2023-04-12 NOTE — Telephone Encounter (Signed)
This message is to inform you that the patient has not yet read the following message. (Notification date: April 11, 2023)    LONG TERM MONITOR  From [name], LPN To [name], [name] and Delivered 04/06/2023  2:25 PM      Heart monitor showed some extra heart beats, at times could have a few in a row.  Can we refer him to EP for palpitations.  Toprol he is on helps with these extra beats but higher symptoms led to fatigue, may require alternative treatment option.     Dominga Ferry MD   EP is electrophysiology - this doctor takes care of electrical issues of the heart.   If you agree to this referral, let me know I will enter order for you.    Thanks,   [name], LPN      [address]  MyChart User Last Read On  Thomas [name] Not Read

## 2023-04-12 NOTE — Telephone Encounter (Signed)
Patient notified and verbalized understanding.   Referral placed.   

## 2023-04-17 ENCOUNTER — Encounter: Payer: Self-pay | Admitting: Nurse Practitioner

## 2023-04-17 ENCOUNTER — Ambulatory Visit: Payer: Medicare HMO | Attending: Nurse Practitioner | Admitting: Nurse Practitioner

## 2023-04-17 VITALS — BP 106/70 | HR 74 | Ht 69.0 in | Wt 205.6 lb

## 2023-04-17 DIAGNOSIS — I77819 Aortic ectasia, unspecified site: Secondary | ICD-10-CM

## 2023-04-17 DIAGNOSIS — I4729 Other ventricular tachycardia: Secondary | ICD-10-CM

## 2023-04-17 DIAGNOSIS — Z8679 Personal history of other diseases of the circulatory system: Secondary | ICD-10-CM | POA: Diagnosis not present

## 2023-04-17 DIAGNOSIS — I5032 Chronic diastolic (congestive) heart failure: Secondary | ICD-10-CM | POA: Diagnosis not present

## 2023-04-17 DIAGNOSIS — I10 Essential (primary) hypertension: Secondary | ICD-10-CM | POA: Diagnosis not present

## 2023-04-17 DIAGNOSIS — I493 Ventricular premature depolarization: Secondary | ICD-10-CM

## 2023-04-17 DIAGNOSIS — E785 Hyperlipidemia, unspecified: Secondary | ICD-10-CM

## 2023-04-17 DIAGNOSIS — I2510 Atherosclerotic heart disease of native coronary artery without angina pectoris: Secondary | ICD-10-CM | POA: Diagnosis not present

## 2023-04-17 NOTE — Progress Notes (Unsigned)
Cardiology Office Note:   Date:  04/17/2023 ID:  Thomas [name], DOB 09-Jun-1950, MRN [id number] PCP:  Benita Stabile, MD [facility] Providers Cardiologist:  Dina Rich, MD    Referring MD: Benita Stabile, MD   CC: Here for follow-up  History of Present Illness:    Thomas [name] is a very pleasant 72 y.o. male with a PMH of chronic systolic CHF, CAD, s/p CABG x 2 in 2017, T2DM, HLD, HTN, hx of pulmonary HTN, GERD, and renal cyst, who presents today for follow-up.   In 2017 he underwent left heart cath that revealed multivessel CAD, with severe circumflex disease and distal LAD disease, and was followed by CABG x2 with left internal mammary artery to LAD and SVG to OM 2, TEE revealed 50-55% EF. TTE in 2018 revealed EF 45 to 50%, features were consistent with a pseudonormal left ventricular filling pattern with concomitant abnormal relaxation and increased filling pattern, grade 2 DD with a high ventricular filling pattern, PA peak pressure was 31 mmHg, left atrium was mildly dilated. EF improved to 60 to 65% in April 2022 with grade 2 diastolic dysfunction noted, no RWMA noted, there was mildly elevated PASP, mild MR, mild to moderate LAE, RAP at 8 mmHG.   Repeat TTE in 2023 revealed EF at 30 to 35%, global hypokinesis of left ventricle, grade 2 DD, mild dilation of left ventricular internal cavity size, right ventricular volume overload mildly reduced RVSF, mildly elevated PASP, estimated RVSP 44 mmHg, mild to moderate LAE, mild RAE, mild to moderate MR, mild dilatation of ascending aorta measuring 40 mm. Right and left heart cath revealed left main and ostial left circumflex obstructive disease, moderate mid diagonal stenosis with patent LIMA to LAD as well as patent SVG to OM1.  There was moderately elevated LV filling pressures with mild pulmonary hypertension, mean PAP 30 mmHg with preserved cardiac output.  Optimizing medical therapy for CHF was recommended. GDMT adjusted at  outpatient f/u.   Last seen by Dr. Dina Rich on January 29, 2023. Pt noted nonspecific sharp chest pain that was associated with palpitations at times. 2 week Zio patch ordered and Echo was updated. See results below. Was referred to EP for palpitations.   Today he presents for follow-up. He states he is doing well. Denies any recurrent CP or palpitations. He also shares with me that he is no longer having any leg edema. Denies any chest pain, shortness of breath, palpitations, syncope, presyncope, dizziness, orthopnea, PND, swelling or significant weight changes, acute bleeding, or claudication.  ROS:   Review of Systems  Constitutional: Negative.   HENT: Negative.    Eyes: Negative.   Respiratory: Negative.    Cardiovascular: Negative.   Gastrointestinal: Negative.   Genitourinary: Negative.   Musculoskeletal: Negative.   Skin: Negative.   Neurological: Negative.   Endo/Heme/Allergies: Negative.   Psychiatric/Behavioral: Negative.     Please see the history of present illness.    All other systems reviewed and are negative.  EKGs/Labs/Other Studies Reviewed:    The following studies were reviewed today:   EKG:  EKG is not ordered today.   Cardiac monitor 03/2023:    14 day monitor   Occasional supraventricular ectopy in the form of isolated PACs. Rare couplets, triplets. 4 runs of SVT longest 6 beats.   Occasional ventricular ectopy in the form of isolated PVCs (4%). Rare couplets, triplets. 24 runs of NSVT longest 18.4 seconds.   Triggered patient events but no diary  of symptoms included. Triggered events correlated with sinus rhythm, PACs, PVCs   Rare episodes of mobitz I Aurora Mask) second degree block     Patch Wear Time:  13 days and 23 hours (2024-09-30T10:43:19-0400 to 2024-10-14T10:43:03-399)   Patient had a min HR of 29 bpm, max HR of 203 bpm, and avg HR of 63 bpm. Predominant underlying rhythm was Sinus Rhythm. First Degree AV Block was present. 24  Ventricular Tachycardia runs occurred, the run with the fastest interval lasting 4 beats with a  max rate of 203 bpm, the longest lasting 18.4 secs with an avg rate of 145 bpm. 4 Supraventricular Tachycardia runs occurred, the run with the fastest interval lasting 5 beats with a max rate of 122 bpm, the longest lasting 6 beats with an avg rate of  97 bpm. Second Degree AV Block-Mobitz I (Wenckebach) was present. Isolated SVEs were occasional (4.0%, 45644), SVE Couplets were rare (<1.0%, 1821), and SVE Triplets were rare (<1.0%, 474). Isolated VEs were occasional (4.0%, 46012), VE Couplets were  rare (<1.0%, 3856), and VE Triplets were rare (<1.0%, 286). Ventricular Bigeminy and Trigeminy were present.  Echo 01/2023:  1. Left ventricular ejection fraction, by estimation, is 55 to 60%. The  left ventricle has normal function. The left ventricle has no regional  wall motion abnormalities. There is mild left ventricular hypertrophy.  Left ventricular diastolic parameters  were normal.   2. Right ventricular systolic function is normal. The right ventricular  size is normal.   3. The mitral valve is normal in structure. Trivial mitral valve  regurgitation. No evidence of mitral stenosis.   4. The tricuspid valve is abnormal.   5. The aortic valve is tricuspid. Aortic valve regurgitation is not  visualized. No aortic stenosis is present.   6. There is mild dilatation of the ascending aorta, measuring 43 mm.   Comparison(s): EF 40-45%. Mild mitral regurgitation. Ascending aorta  diliatation measuring 40 mm.  2D complete echo on December 06, 2021:  1. Left ventricular ejection fraction, by estimation, is 40 to 45%. The  left ventricle has mildly decreased function. The left ventricle  demonstrates global hypokinesis. There is mild left ventricular  hypertrophy. Left ventricular diastolic parameters  are indeterminate. The average left ventricular global longitudinal strain is -17.5 %. The global  longitudinal strain is normal.   2. Probable LV apical trabeculation noted, consider limited study with  Definity contrast to better exclude mural thrombus.   3. Right ventricular systolic function is normal. The right ventricular  size is normal. There is normal pulmonary artery systolic pressure. The estimated right ventricular systolic pressure is 26.2 mmHg.   4. Left atrial size was upper normal.   5. Right atrial size was mildly dilated.   6. The mitral valve is grossly normal. Mild mitral valve regurgitation.   7. The aortic valve is tricuspid. Aortic valve regurgitation is trivial.   8. Aortic dilatation noted. There is mild dilatation of the ascending  aorta, measuring 40 mm.   9. The inferior vena cava is normal in size with greater than 50%  respiratory variability, suggesting right atrial pressure of 3 mmHg.   Comparison(s): Prior images reviewed side by side. LVEF has improved to  approximately 45% and GLS also now normal range. RVSP has normalized.   Right and left heart cath on August 09, 2021:   Ost LM to Mid LM lesion is 50% stenosed.   1st Diag lesion is 70% stenosed.   Ost Cx lesion is 75% stenosed.  Prox Graft to Mid Graft lesion between Ost LAD and 1st Mrg  is 35% stenosed.   LIMA graft was visualized by angiography and is normal in caliber.   SVG graft was visualized by angiography and is normal in caliber.   The graft exhibits no disease.   LV end diastolic pressure is moderately elevated.   Hemodynamic findings consistent with mild pulmonary hypertension.   Left main and ostial LCx obstructive disease. Moderate mid diagonal stenosis Patent LIMA to the LAD Patent SVG to OM 1 Moderately elevated LV filling pressures. LVEDP 22 mm Hg. PCWP mean 21 mm Hg Mild pulmonary HTN mean PAP 30 mm Hg Preserved cardiac output 6 L/min   Plan: optimize medical therapy for CHF. Decrease in LV function cannot be explained by ischemia.   Lexiscan stress test on October 30, 2019: There was no ST segment deviation noted during stress. Findings consistent with prior inferior myocardial infarction, there is no current ischemia. This is an intermediate risk study. Risk based on decreased LVEF, there is no current myocardium at jeopardy. Consider correlating LVEF with echo. The left ventricular ejection fraction is mildly decreased (44%).    TEE on February 28, 2016:  Left ventricle: Normal cavity size and wall thickness. LV systolic  function is low normal with an EF of 50-55%. There are no obvious wall  motion abnormalities.   Left atrium: Cavity is dilated and dilated.   Aortic valve: The valve is trileaflet. No regurgitation.   Mitral valve: Trace regurgitation.   Right ventricle: Normal cavity size, wall thickness and ejection  fraction.   Pulmonic valve: Mild regurgitation.   Left ventricle: Normal cavity size.   Vascular ultrasound Doppler pre-CABG carotid and arms on February 24, 2016: - The vertebral arteries appear patent with antegrade flow.  - Findings consistent with 1-39 percent stenosis involving the    right internal carotid artery and the left internal carotid    artery.  - Pedal artery waveforms within normal limits.  Left heart cath on January 31, 2016: Ost Cx lesion, 75 %stenosed. LM lesion, 50 %stenosed. The left ventricular systolic function is normal. The left ventricular ejection fraction is 50-55% by visual estimate. LV end diastolic pressure is mildly elevated.   Eccentric 70-80% proximal/ostial circumflex. Circumflex territory is large. 50% stenosis in the distal left main. Codominant right coronary Overall normal LV function with mild elevation and end-diastolic pressure.   RECOMMENDATIONS:   Optimize medical therapy, although the patient is unable to use statin therapy due to side effects. Will add low-dose beta blocker therapy. Beta-blockade dosing will be compromised by relatively slow resting heart rate. Consider  CABG versus higher risk PCI of the ostial circumflex in the setting of distal left main disease.Marland Kitchen  Lexiscan stress test on August 02, 2015: No diagnostic ST segment changes. Occasional PVCs noted during exercise, more frequent in recovery. Nonlimiting chest pain reported with hypertensive response. Intermediate risk Duke treadmill score of 3.5. Small, moderate intensity, mid to apical inferolateral defect that is reversible consistent with ischemia. This is an intermediate risk study. Nuclear stress EF: 37%, appears higher visually. Suggest echocardiogram to further assess.   Physical Exam:    VS:  BP 106/70 (BP Location: Left Arm, Patient Position: Sitting, Cuff Size: Large)   Pulse 74   Ht 5\' 9"  (1.753 m)   Wt 205 lb 9.6 oz (93.3 kg)   SpO2 93%   BMI 30.36 kg/m     Wt Readings from Last 3 Encounters:  04/17/23 205  lb 9.6 oz (93.3 kg)  01/29/23 203 lb 9.6 oz (92.4 kg)  09/20/22 203 lb 9.6 oz (92.4 kg)     GEN: Obese, 71 y.o. Caucasian male in NAD.  HEENT: Normal NECK: No JVD; No carotid bruits CARDIAC: S1/S2, RRR, no murmurs, rubs, gallops; 2+ peripheral pulses throughout, strong and equal bilaterally RESPIRATORY:  Clear and diminished to auscultation without rales, wheezing or rhonchi  MUSCULOSKELETAL:  No edema; No deformity SKIN: Warm and dry NEUROLOGIC:  Alert and oriented x 3 PSYCHIATRIC:  Normal affect   ASSESSMENT & PLAN:    In order of problems listed above:  CAD, s/p CABG x 2 Cardiac cath 07/2021 revealed left main and ostial left circumflex obstructive disease with moderate mid diagonal stenosis, patent LIMA to LAD, patent SVG to OM1, moderately elevated LV filling pressure with mild pulmonary hypertension, mean PAP 30 mmHg, medical therapy recommended with optimizing medical therapy for CHF. Stable with no anginal symptoms. No indication for ischemic evaluation.  Continue current medication regimen. Heart healthy diet and regular cardiovascular exercise encouraged.     2. HFmrEF -> HFimpEF Stage C, NYHA class I symptoms. EF returned to normal, 55-60% 01/2023. Euvolemic and well compensated on exam. Low sodium diet, fluid restriction <2L, and daily weights encouraged. Educated to contact our office for weight gain of 2 lbs overnight or 5 lbs in one week.  Continue current medication regimen.  3. Hyperlipidemia  Recent LDL 42. Continue Crestor 40 mg daily.  Heart healthy diet and regular cardiovascular exercise encouraged.   4. Hx of PVC's, NSVT Denies any recent palpitations or tachycardia.  See recent monitor report noted above. Asymptomatic regarding NSVT, however Dr. Wyline Mood previously recommended EP referral for palpitations as higher dose of Toprol-XL led to fatigue/could not tolerate. Continue current dose of Toprol-XL. Heart healthy diet and regular cardiovascular exercise encouraged.   5. HTN Blood pressure stable. Discussed SBP goal < 130. Discussed to monitor BP at home at least 2 hours after medications and sitting for 5-10 minutes. Continue current medication regimen.  Heart healthy diet and regular cardiovascular exercise encouraged.   6. Hx of pulmonary HTN Mild pulmonary hypertension noted on right and left heart cath in April 2023 with mean PAP at 30 mmHg. Appears previous pulmonary HTN to be group 2 PAH, with improvement in pulmonary artery systolic pressure with CHF medical treatment. Denies any symptoms. Heart healthy diet and regular cardiovascular exercise encouraged.  ED precautions discussed.  7. Aortic dilatation Most recent TTE revealed mild dilatation of ascending aorta, measuring 43 mm.  Plan to update TTE in one year or sooner if indicated. Heart healthy diet and regular cardiovascular exercise encouraged.      8.  Disposition: Care and ED precautions discussed. Follow-up with Dr. Dina Rich in 4-6 months or sooner if anything changes.   Medication Adjustments/Labs and Tests Ordered: Current medicines are reviewed at length  with the patient today.  Concerns regarding medicines are outlined above.  No orders of the defined types were placed in this encounter.  No orders of the defined types were placed in this encounter.   Patient Instructions  Medication Instructions:  Your physician recommends that you continue on your current medications as directed. Please refer to the Current Medication list given to you today.  Labwork: none  Testing/Procedures: none  Follow-Up: Your physician recommends that you schedule a follow-up appointment in: 4-6 months with Branch or Philis Nettle  Any Other Special Instructions Will Be Listed Below (If Applicable).  If you need  a refill on your cardiac medications before your next appointment, please call your pharmacy.    Signed, Sharlene Dory, NP

## 2023-04-17 NOTE — Patient Instructions (Addendum)
Medication Instructions:  Your physician recommends that you continue on your current medications as directed. Please refer to the Current Medication list given to you today.  Labwork: none  Testing/Procedures: none  Follow-Up: Your physician recommends that you schedule a follow-up appointment in: 4-6 months with Branch or Philis Nettle  Any Other Special Instructions Will Be Listed Below (If Applicable).  If you need a refill on your cardiac medications before your next appointment, please call your pharmacy.

## 2023-05-22 DIAGNOSIS — H25813 Combined forms of age-related cataract, bilateral: Secondary | ICD-10-CM | POA: Diagnosis not present

## 2023-05-22 DIAGNOSIS — Z7984 Long term (current) use of oral hypoglycemic drugs: Secondary | ICD-10-CM | POA: Diagnosis not present

## 2023-05-22 DIAGNOSIS — H524 Presbyopia: Secondary | ICD-10-CM | POA: Diagnosis not present

## 2023-05-28 ENCOUNTER — Ambulatory Visit: Payer: Medicare HMO | Attending: Cardiovascular Disease | Admitting: Cardiovascular Disease

## 2023-05-28 ENCOUNTER — Encounter: Payer: Self-pay | Admitting: Cardiovascular Disease

## 2023-05-28 VITALS — BP 140/66 | HR 72 | Ht 69.0 in | Wt 207.2 lb

## 2023-05-28 DIAGNOSIS — R002 Palpitations: Secondary | ICD-10-CM | POA: Diagnosis not present

## 2023-05-28 DIAGNOSIS — I5032 Chronic diastolic (congestive) heart failure: Secondary | ICD-10-CM | POA: Diagnosis not present

## 2023-05-28 DIAGNOSIS — I4729 Other ventricular tachycardia: Secondary | ICD-10-CM | POA: Diagnosis not present

## 2023-05-28 DIAGNOSIS — I493 Ventricular premature depolarization: Secondary | ICD-10-CM

## 2023-05-28 NOTE — Progress Notes (Unsigned)
Electrophysiology Office Note:    Date:  05/28/2023   ID:  Thomas [name] [birth year], MRN [id number]  PCP:  Benita Stabile, MD   [facility] Providers Cardiologist:  Dina Rich, MD { Click to update primary MD,subspecialty MD or APP then REFRESH:1}    Referring MD: Antoine Poche, MD   History of Present Illness:    Thomas [name] is a 73 y.o. male with a medical history significant for CAD status post CABG, heart failure with reduced EF that is recovered, hypertension, pulmonary hypertension, referred for arrhythmia management.     He has a history of coronary disease and underwent CABG x 2. His EF was severely depressed for a while but has recovered. He was seen by Dr. Wyline Mood in September 2024 at which time he noted nonspecific sharp chest pain associate with palpitations.  A Zio patch showed frequent PVCs.  He does note that over time, the sharp pain has resolved.  He is continued to have intermittent palpitations unrelated to chest pain.  He had been metoprolol XL 25 mg for some time, but this was decreased due to heart rates in the 50s associated with "fuzzy headedness."  He was referred to EP for evaluation of ventricular ectopy..       Today, he reports that he is doing well.  He has no complaints currently.  He feels slightly better off metoprolol 25.  Continues to have intermittent episodes of palpitations.  They do not particularly bothersome for him  EKGs/Labs/Other Studies Reviewed Today:     Echocardiogram:  TTE October 2024 EF 55 to 60%.  Mild LVH.  No significant valvular disease.   Monitors:  13 day monitor October 2024-- my interpretation Sinus rhythm 45 to 123 bpm, average 63 bpm 4% PACs, 4% PVCs Patient triggered episodes correlated with isolated PVCs. There was 1 wide-complex run lasting 18.4 seconds labeled with ventricular tachycardia.  Stress testing:  Multiple stress tests over the years, reviewed in epic   Advanced  imaging:  Cardiac MRI Ordered January 2025  Cardiac catherization  Right and left heart cath August 09, 2021   Ost LM to Mid LM lesion is 50% stenosed.   1st Diag lesion is 70% stenosed.   Ost Cx lesion is 75% stenosed.   Prox Graft to Mid Graft lesion between Ost LAD and 1st Mrg  is 35% stenosed.   LIMA graft was visualized by angiography and is normal in caliber.   SVG graft was visualized by angiography and is normal in caliber.   The graft exhibits no disease.   LV end diastolic pressure is moderately elevated.   Hemodynamic findings consistent with mild pulmonary hypertension.  EKG:   EKG Interpretation Date/Time:  Monday May 28 2023 14:10:58 EST Ventricular Rate:  72 PR Interval:  224 QRS Duration:  94 QT Interval:  384 QTC Calculation: 420 R Axis:   -70  Text Interpretation: Sinus rhythm with 1st degree A-V block Left axis deviation Anterior infarct , age undetermined When compared with ECG of 09-Aug-2021 07:41, No significant change was found Confirmed by York Pellant [phone]) on 05/28/2023 2:47:41 PM     Physical Exam:    VS:  BP (!) 140/66 (BP Location: Left Arm, Patient Position: Sitting, Cuff Size: Large)   Pulse 72   Ht 5\' 9"  (1.753 m)   Wt 207 lb 3.2 oz (94 kg)   SpO2 97%   BMI 30.60 kg/m     Wt Readings from Last 3  Encounters:  05/28/23 207 lb 3.2 oz (94 kg)  04/17/23 205 lb 9.6 oz (93.3 kg)  01/29/23 203 lb 9.6 oz (92.4 kg)     GEN: Well nourished, well developed in no acute distress CARDIAC: RRR, no murmurs, rubs, gallops RESPIRATORY:  Normal work of breathing MUSCULOSKELETAL: no edema    ASSESSMENT & PLAN:     Frequent PVCs, NSVT Noted on monitor Associated with sensation of skipped beats No syncope, presyncope, chest pain associated with PVCs Longest run of wide-complex tachycardia lasted 18 seconds --there were no associated symptoms Will order cardiac MRI to evaluate for LV scar He is going to try to increase metoprolol to 25  mg to see if he tolerates it better.  If not, he will reduce again to 12.5 mg  Coronary artery disease Continue rosuvastatin 40, aspirin 81 mg No recent symptoms to suggest active ischemia  CHF with recovered EF Continue spironolactone 25, metoprolol XL 25, Entresto 97-103, empagliflozin 25      Signed, Maurice Small, MD  05/28/2023 2:47 PM    [facility]

## 2023-05-28 NOTE — Patient Instructions (Signed)
Medication Instructions:  Your physician recommends that you continue on your current medications as directed. Please refer to the Current Medication list given to you today. *If you need a refill on your cardiac medications before your next appointment, please call your pharmacy*   Lab Work: CBC today - please have completed at [facility] on our first floor - does not have to be fasting If you have labs (blood work) drawn today and your tests are completely normal, you will receive your results only by: MyChart Message (if you have MyChart) OR A paper copy in the mail If you have any lab test that is abnormal or we need to change your treatment, we will call you to review the results.   Testing/Procedures: Cardiac MRI - someone from the hospital will contact you to schedule  Your physician has requested that you have a cardiac MRI. Cardiac MRI uses a computer to create images of your heart as its beating, producing both still and moving pictures of your heart and major blood vessels. For further information please visit [email or web address]. Please follow the instruction sheet given to you today for more information.  Follow-Up: At [facility], you and your health needs are our priority.  As part of our continuing mission to provide you with exceptional heart care, we have created designated Provider Care Teams.  These Care Teams include your primary Cardiologist (physician) and Advanced Practice Providers (APPs -  Physician Assistants and Nurse Practitioners) who all work together to provide you with the care you need, when you need it.  We recommend signing up for the patient portal called "MyChart".  Sign up information is provided on this After Visit Summary.  MyChart is used to connect with patients for Virtual Visits (Telemedicine).  Patients are able to view lab/test results, encounter notes, upcoming appointments, etc.  Non-urgent messages can be sent to your provider as well.   To  learn more about what you can do with MyChart, go to [email or web address].    Your next appointment:   2-3 months (anytime after your cardiac MRI)  Provider:   York Pellant, MD

## 2023-06-05 DIAGNOSIS — G4733 Obstructive sleep apnea (adult) (pediatric): Secondary | ICD-10-CM | POA: Diagnosis not present

## 2023-06-12 DIAGNOSIS — D509 Iron deficiency anemia, unspecified: Secondary | ICD-10-CM | POA: Diagnosis not present

## 2023-06-12 DIAGNOSIS — E1122 Type 2 diabetes mellitus with diabetic chronic kidney disease: Secondary | ICD-10-CM | POA: Diagnosis not present

## 2023-06-12 DIAGNOSIS — E782 Mixed hyperlipidemia: Secondary | ICD-10-CM | POA: Diagnosis not present

## 2023-06-13 LAB — LAB REPORT - SCANNED
A1c: 8.6
EGFR: 71

## 2023-06-21 ENCOUNTER — Other Ambulatory Visit: Payer: Self-pay | Admitting: Cardiology

## 2023-06-26 ENCOUNTER — Ambulatory Visit (HOSPITAL_COMMUNITY): Payer: Medicare HMO

## 2023-06-27 ENCOUNTER — Encounter (HOSPITAL_COMMUNITY): Payer: Self-pay

## 2023-06-29 ENCOUNTER — Other Ambulatory Visit: Payer: Self-pay | Admitting: Cardiovascular Disease

## 2023-06-29 ENCOUNTER — Ambulatory Visit (HOSPITAL_COMMUNITY)
Admission: RE | Admit: 2023-06-29 | Discharge: 2023-06-29 | Disposition: A | Discharge status: Home / Self Care | Payer: Medicare HMO | Source: Ambulatory Visit | Attending: Cardiovascular Disease | Admitting: Cardiovascular Disease

## 2023-06-29 DIAGNOSIS — I4729 Other ventricular tachycardia: Secondary | ICD-10-CM

## 2023-06-29 DIAGNOSIS — R002 Palpitations: Secondary | ICD-10-CM | POA: Diagnosis not present

## 2023-06-29 DIAGNOSIS — I493 Ventricular premature depolarization: Secondary | ICD-10-CM

## 2023-06-29 DIAGNOSIS — I5032 Chronic diastolic (congestive) heart failure: Secondary | ICD-10-CM | POA: Diagnosis present

## 2023-06-29 MED ORDER — GADOBUTROL 1 MMOL/ML IV SOLN
10.0000 mL | Freq: Once | INTRAVENOUS | Status: AC | PRN
  Administered 2023-06-29: 10 mL via INTRAVENOUS

## 2023-07-03 ENCOUNTER — Encounter: Payer: Self-pay | Admitting: Cardiovascular Disease

## 2023-07-06 DIAGNOSIS — I5022 Chronic systolic (congestive) heart failure: Secondary | ICD-10-CM | POA: Diagnosis not present

## 2023-07-06 DIAGNOSIS — E1122 Type 2 diabetes mellitus with diabetic chronic kidney disease: Secondary | ICD-10-CM | POA: Diagnosis not present

## 2023-07-06 DIAGNOSIS — I77810 Thoracic aortic ectasia: Secondary | ICD-10-CM | POA: Diagnosis not present

## 2023-07-06 DIAGNOSIS — I2510 Atherosclerotic heart disease of native coronary artery without angina pectoris: Secondary | ICD-10-CM | POA: Diagnosis not present

## 2023-07-06 DIAGNOSIS — E782 Mixed hyperlipidemia: Secondary | ICD-10-CM | POA: Diagnosis not present

## 2023-07-06 DIAGNOSIS — I130 Hypertensive heart and chronic kidney disease with heart failure and stage 1 through stage 4 chronic kidney disease, or unspecified chronic kidney disease: Secondary | ICD-10-CM | POA: Diagnosis not present

## 2023-07-06 DIAGNOSIS — G4733 Obstructive sleep apnea (adult) (pediatric): Secondary | ICD-10-CM | POA: Diagnosis not present

## 2023-07-06 DIAGNOSIS — R945 Abnormal results of liver function studies: Secondary | ICD-10-CM | POA: Diagnosis not present

## 2023-07-06 DIAGNOSIS — K219 Gastro-esophageal reflux disease without esophagitis: Secondary | ICD-10-CM | POA: Diagnosis not present

## 2023-07-06 DIAGNOSIS — I471 Supraventricular tachycardia, unspecified: Secondary | ICD-10-CM | POA: Diagnosis not present

## 2023-07-06 DIAGNOSIS — N401 Enlarged prostate with lower urinary tract symptoms: Secondary | ICD-10-CM | POA: Diagnosis not present

## 2023-07-06 DIAGNOSIS — I10 Essential (primary) hypertension: Secondary | ICD-10-CM | POA: Diagnosis not present

## 2023-07-19 DIAGNOSIS — R0981 Nasal congestion: Secondary | ICD-10-CM | POA: Diagnosis not present

## 2023-07-19 DIAGNOSIS — G4733 Obstructive sleep apnea (adult) (pediatric): Secondary | ICD-10-CM | POA: Diagnosis not present

## 2023-07-23 ENCOUNTER — Other Ambulatory Visit: Payer: Self-pay | Admitting: Cardiology

## 2023-08-14 ENCOUNTER — Encounter: Payer: Self-pay | Admitting: *Deleted

## 2023-08-16 ENCOUNTER — Encounter: Payer: Self-pay | Admitting: Cardiology

## 2023-08-16 ENCOUNTER — Ambulatory Visit: Payer: Medicare HMO | Attending: Cardiology | Admitting: Cardiology

## 2023-08-16 VITALS — BP 108/58 | HR 62 | Ht 69.0 in | Wt 203.8 lb

## 2023-08-16 DIAGNOSIS — E782 Mixed hyperlipidemia: Secondary | ICD-10-CM | POA: Diagnosis not present

## 2023-08-16 DIAGNOSIS — I5032 Chronic diastolic (congestive) heart failure: Secondary | ICD-10-CM | POA: Diagnosis not present

## 2023-08-16 DIAGNOSIS — I10 Essential (primary) hypertension: Secondary | ICD-10-CM

## 2023-08-16 DIAGNOSIS — R002 Palpitations: Secondary | ICD-10-CM

## 2023-08-16 MED ORDER — METOPROLOL SUCCINATE ER 25 MG PO TB24
25.0000 mg | ORAL_TABLET | Freq: Every day | ORAL | Status: DC
Start: 2023-08-16 — End: 2023-09-27

## 2023-08-16 NOTE — Progress Notes (Signed)
 Clinical Summary Thomas [name] is a 73 y.o.male seen today for follow up of the following medical problems.      1.Chronic HFimpEF - long history of HFpEF, however few years ago did develop systolic dysfunction 07/2020 echo: LVEF 60-65%, no WMAs, grade II DD  - 06/2021 echo: LVEF 30-35%, grade II dd, D septum, mild RV dysfunction, PASP 44 -07/2021 cath: ostial LM 50%, D1 70%, LCX osital 75%, RCA small vessel mild diffuse disease. LIMA-LAD patent, SVG sequential to LAD and Om1 patent LVEDP 22, mean PA 30   PCWP 21, CI 2.87  11/2021 echo: LVEF 40-45% - 01/2023 echo LVEF 55-60% -06/2023 cMRI: LVEF 44%, no LGE - toprol dosing limited due to fatigue and bradycardia.     - breathing ok, no recent edema - compliant with meds - EP had increased toprol recently, he is tolerating dose.      2. Hyperlipidemia - lipitor caused muscle aches. Simvastatin caused side effects. Currently on crestor 10mg  daily, tolerating.  - he is on rosuvastatin 70m, zetia 10mg  daily - 06/2023 TC 129 TG 129 HDL 51 LDL 55    3. CAD - history of LIMA-LAD and SVG-OM2 CABG 01/2016. LVEF 50-55% by TEE at the time - 03/2017 echo LVEF 45-50%. Grade Ii diastolic dysfunction.     08/2017 nuclear stress: PVCs, short runs NSVT. Small mild intensity mid to basal inferolatearl defect fixed most consistent with scar. LVEF 44% 10/2017 echo LVEF 50-55%, no WMAs     - metoprolol dosing limited to fatigue and bradycardia, has only been on 12.5mg  daily. At Jan 2025 appt EP did increase to 25mg  for palpitations.    10/2019 Lexiscan nuclear stress:no ischemia.  07/2020 echo: LVEF 60-65%, grade II dd, normal RV function, mild MR    07/2021 cath: ostial LM 50%, D1 70%, LCX osital 75%, RCA small vessel mild diffuse disease. LIMA-LAD patent, SVG sequential to LAD and Om1 patent LVEDP 22, mean PA 30   PCWP 21, CI 2.87   - no recent chest pains.   4. Irregular heart beat/PVCs/NSVT - PVCs noted on stress test - higher beta blocker dose  causes fatigue, feels good on toprol 12.5mg  daily   - some recent palpitations, can have sharp pain associated.    01/2023 monitor: occasional PACs, 4 runs SVT longest 6 beats. Occasional PVCs 4% burden, 24 runs of NSVT longest 18.4 seconds.  - symptoms improved on toprol 25mg  daily    5. HTN - he is compliant with meds  6. Aortic dilatation - 01/2023 echo: ascedning aorta 43 mm Past Medical History:  Diagnosis Date   Acid reflux    Adenomatous colon polyp 08/2010   Anxiety    Arthritis    Chronic back pain    Coronary artery disease    Diabetes mellitus without complication (HCC)    GERD (gastroesophageal reflux disease)    Gout    Hiatal hernia    High cholesterol    HTN (hypertension)    Hyperlipidemia    Renal cyst    Large Left   Restless legs    Sleep apnea    uses cpap     Allergies  Allergen Reactions   Ambien [Zolpidem Tartrate] Other (See Comments)    Caused memory problems   Simvastatin     REACTION: arm numbness Patient currently on this medication (03/2012).     Current Outpatient Medications  Medication Sig Dispense Refill   alfuzosin (UROXATRAL) 10 MG 24 hr tablet TAKE  1 TABLET BY MOUTH EVERYDAY AT BEDTIME 90 tablet 3   amLODipine (NORVASC) 5 MG tablet TAKE 1 TABLET (5 MG TOTAL) BY MOUTH DAILY. 90 tablet 3   aspirin EC 81 MG tablet Take 1 tablet (81 mg total) by mouth daily. Swallow whole.     clotrimazole-betamethasone (LOTRISONE) cream Apply 1 application. topically daily as needed (irritation).     Empagliflozin-metFORMIN HCl ER 25-1000 MG TB24 Take 1 tablet by mouth daily.     ENTRESTO 97-103 MG TAKE 1 TABLET BY MOUTH TWICE A DAY 180 tablet 2   ezetimibe (ZETIA) 10 MG tablet Take 10 mg by mouth daily.     furosemide (LASIX) 40 MG tablet TAKE 1.5 TABLETS (60 MG TOTAL) BY MOUTH DAILY AS NEEDED (FOR SWELLING, SHORTNESS OF BREATH). 135 tablet 1   gabapentin (NEURONTIN) 300 MG capsule Take 300 mg by mouth at bedtime. May take additional 300 mg if  needed during the day     Iron-FA-B Cmp-C-Biot-Probiotic (FUSION PLUS) CAPS Take 1 capsule by mouth daily.     isosorbide mononitrate (IMDUR) 30 MG 24 hr tablet TAKE 1.5 TABLETS BY MOUTH DAILY. 135 tablet 1   levocetirizine (XYZAL) 5 MG tablet Take 5 mg by mouth daily.     meloxicam (MOBIC) 7.5 MG tablet Take 7.5 mg by mouth 2 (two) times daily.     metoprolol succinate (TOPROL-XL) 25 MG 24 hr tablet TAKE 1/2 TABLET BY MOUTH DAILY 45 tablet 2   mometasone (NASONEX) 50 MCG/ACT nasal spray Place 2 sprays into the nose at bedtime as needed (for nasal congestion).     nitroGLYCERIN (NITROSTAT) 0.4 MG SL tablet Place 1 tablet (0.4 mg total) under the tongue every 5 (five) minutes x 3 doses as needed for chest pain (If no relief after 3rd dose proceed to ED or call 911). 25 tablet 3   pantoprazole (PROTONIX) 40 MG tablet TAKE 1 TABLET BY MOUTH EVERY DAY 90 tablet 0   rosuvastatin (CRESTOR) 40 MG tablet TAKE 1 TABLET BY MOUTH EVERY DAY 90 tablet 1   spironolactone (ALDACTONE) 25 MG tablet Take 1 tablet (25 mg total) by mouth daily. 90 tablet 3   Current Facility-Administered Medications  Medication Dose Route Frequency Provider Last Rate Last Admin   sodium chloride flush (NS) 0.9 % injection 3 mL  3 mL Intravenous Q12H Laurann Pollock, MD         Past Surgical History:  Procedure Laterality Date   APPENDECTOMY  1967   CARDIAC CATHETERIZATION N/A 01/31/2016   Procedure: Left Heart Cath and Coronary Angiography;  Surgeon: Arty Binning, MD;  Location: [facility] INVASIVE CV LAB;  Service: Cardiovascular;  Laterality: N/A;   CARDIOVASCULAR STRESS TEST  2004   Treadmill Stress Test   CARDIOVASCULAR STRESS TEST  2010   CHOLECYSTECTOMY  07/2006   COLONOSCOPY  08/2010   RMR: Cecal tubular adenoma removed, otherwise normal exam. next tcs 08/2015   CORONARY ARTERY BYPASS GRAFT N/A 02/28/2016   Procedure: CORONARY ARTERY BYPASS GRAFTING x two using left internal mammary artery and right leg greater saphenous  vein harvested endoscopically;  Surgeon: Bartley Lightning, MD;  Location: [facility] OR;  Service: Open Heart Surgery;  Laterality: N/A;   KNEE SURGERY  1995   Micro   RIGHT/LEFT HEART CATH AND CORONARY/GRAFT ANGIOGRAPHY N/A 08/09/2021   Procedure: RIGHT/LEFT HEART CATH AND CORONARY/GRAFT ANGIOGRAPHY;  Surgeon: [place], Peter M, MD;  Location: [facility] INVASIVE CV LAB;  Service: Cardiovascular;  Laterality: N/A;   TEE WITHOUT CARDIOVERSION  N/A 02/28/2016   Procedure: TRANSESOPHAGEAL ECHOCARDIOGRAM (TEE);  Surgeon: Bartley Lightning, MD;  Location: [facility] OR;  Service: Open Heart Surgery;  Laterality: N/A;   UPPER GASTROINTESTINAL ENDOSCOPY  08/2010   RMR: GERD benign biopsy   VASECTOMY       Allergies  Allergen Reactions   Ambien [Zolpidem Tartrate] Other (See Comments)    Caused memory problems   Simvastatin     REACTION: arm numbness Patient currently on this medication (03/2012).      Family History  Problem Relation Age of Onset   Lymphoma Mother    Cancer Mother    Ulcers Father    Cancer Father    Ulcers Paternal Grandfather        Stomach ulcer, resection   Heart attack Other    Diabetes Other      Social History Mr. Antigua reports that he has never smoked. He has never been exposed to tobacco smoke. He has never used smokeless tobacco. Mr. Degraffenreid reports current alcohol use of about 2.0 standard drinks of alcohol per week.     Physical Examination Today's Vitals   08/16/23 0954  BP: (!) 108/58  Pulse: 62  SpO2: 96%  Weight: 203 lb 12.8 oz (92.4 kg)  Height: 5\' 9"  (1.753 m)   Body mass index is 30.1 kg/m.  Gen: resting comfortably, no acute distress HEENT: no scleral icterus, pupils equal round and reactive, no palptable cervical adenopathy,  CV: RRR, no m/rg, no jvd Resp: Clear to auscultation bilaterally GI: abdomen is soft, non-tender, non-distended, normal bowel sounds, no hepatosplenomegaly MSK: extremities are warm, no edema.  Skin: warm, no rash Neuro:  no focal  deficits Psych: appropriate affect   Diagnostic Studies 10/2019 nuclear stress There was no ST segment deviation noted during stress. Findings consistent with prior inferior myocardial infarction, there is no current ischemia. This is an intermediate risk study. Risk based on decreased LVEF, there is no current myocardium at jeopardy. Consider correlating LVEF with echo. The left ventricular ejection fraction is mildly decreased (44%).     07/2020 echo 1. Left ventricular ejection fraction, by estimation, is 60 to 65%. The  left ventricle has normal function. The left ventricle has no regional  wall motion abnormalities. Left ventricular diastolic parameters are  consistent with Grade II diastolic  dysfunction (pseudonormalization).   2. Right ventricular systolic function is normal. The right ventricular  size is normal. There is mildly elevated pulmonary artery systolic  pressure.   3. Left atrial size was mild to moderately dilated.   4. The mitral valve is normal in structure. Mild mitral valve  regurgitation.   5. The aortic valve is normal in structure. Aortic valve regurgitation is  not visualized.   6. The inferior vena cava is dilated in size with >50% respiratory  variability, suggesting right atrial pressure of 8 mmHg.    07/2021 cath Ost LM to Mid LM lesion is 50% stenosed.   1st Diag lesion is 70% stenosed.   Ost Cx lesion is 75% stenosed.   Prox Graft to Mid Graft lesion between Ost LAD and 1st Mrg  is 35% stenosed.   LIMA graft was visualized by angiography and is normal in caliber.   SVG graft was visualized by angiography and is normal in caliber.   The graft exhibits no disease.   LV end diastolic pressure is moderately elevated.   Hemodynamic findings consistent with mild pulmonary hypertension.   Left main and ostial LCx obstructive disease. Moderate mid  diagonal stenosis Patent LIMA to the LAD Patent SVG to OM 1 Moderately elevated LV filling pressures.  LVEDP 22 mm Hg. PCWP mean 21 mm Hg Mild pulmonary HTN mean PAP 30 mm Hg Preserved cardiac output 6 L/min   Plan: optimize medical therapy for CHF. Decrease in LV function cannot be explained by ischemia.      11/2021 echo 1. Left ventricular ejection fraction, by estimation, is 40 to 45%. The  left ventricle has mildly decreased function. The left ventricle  demonstrates global hypokinesis. There is mild left ventricular  hypertrophy. Left ventricular diastolic parameters  are indeterminate. The average left ventricular global longitudinal strain  is -17.5 %. The global longitudinal strain is normal.   2. Probable LV apical trabeculation noted, consider limited study with  Definity contrast to better exclude mural thrombus.   3. Right ventricular systolic function is normal. The right ventricular  size is normal. There is normal pulmonary artery systolic pressure. The  estimated right ventricular systolic pressure is 26.2 mmHg.   4. Left atrial size was upper normal.   5. Right atrial size was mildly dilated.   6. The mitral valve is grossly normal. Mild mitral valve regurgitation.   7. The aortic valve is tricuspid. Aortic valve regurgitation is trivial.   8. Aortic dilatation noted. There is mild dilatation of the ascending  aorta, measuring 40 mm.   9. The inferior vena cava is normal in size with greater than 50%  respiratory variability, suggesting right atrial pressure of 3 mmHg.   01/2023 echo 1. Left ventricular ejection fraction, by estimation, is 55 to 60%. The  left ventricle has normal function. The left ventricle has no regional  wall motion abnormalities. There is mild left ventricular hypertrophy.  Left ventricular diastolic parameters  were normal.   2. Right ventricular systolic function is normal. The right ventricular  size is normal.   3. The mitral valve is normal in structure. Trivial mitral valve  regurgitation. No evidence of mitral stenosis.   4. The  tricuspid valve is abnormal.   5. The aortic valve is tricuspid. Aortic valve regurgitation is not  visualized. No aortic stenosis is present.   6. There is mild dilatation of the ascending aorta, measuring 43 mm.   01/2023 monitor 14 day monitor   Occasional supraventricular ectopy in the form of isolated PACs. Rare couplets, triplets. 4 runs of SVT longest 6 beats.   Occasional ventricular ectopy in the form of isolated PVCs (4%). Rare couplets, triplets. 24 runs of NSVT longest 18.4 seconds.   Triggered patient events but no diary of symptoms included. Triggered events correlated with sinus rhythm, PACs, PVCs   Rare episodes of mobitz I Aurora Mask) second degree block   Assessment and Plan   1.Chronic HFimpEF - no recent symptoms, he is euvolemic today - continue current meds - beta blocker dosing has been limtied by fatigue   2. HTN -he is at goal, continue current meds   3. Hyperlipidemia - lipids well controlled, continue current meds   4. CAD - denies any recent symptoms, continue current meds  5. Palpitations/PVCs/NSVT - symptoms improved with higher toprol dosing. Historically fatigue on higher beta blocker dosing but he is currently tolerating well. He has upcoming f/u with EP - continue current meds       Antoine Poche, M.D.

## 2023-08-16 NOTE — Patient Instructions (Signed)
 Medication Instructions:  Continue all current medications.   Labwork: none  Testing/Procedures: none  Follow-Up: 6 months   Any Other Special Instructions Will Be Listed Below (If Applicable).   If you need a refill on your cardiac medications before your next appointment, please call your pharmacy.

## 2023-08-29 ENCOUNTER — Encounter: Payer: Self-pay | Admitting: Cardiovascular Disease

## 2023-08-29 ENCOUNTER — Ambulatory Visit: Payer: Medicare HMO | Attending: Cardiovascular Disease | Admitting: Cardiovascular Disease

## 2023-08-29 VITALS — BP 108/60 | HR 57 | Ht 69.0 in | Wt 202.0 lb

## 2023-08-29 DIAGNOSIS — R002 Palpitations: Secondary | ICD-10-CM

## 2023-08-29 NOTE — Progress Notes (Signed)
 Electrophysiology Office Note:    Date:  08/29/2023   ID:  Thomas, [name] Jun 17, 1950, MRN [id number]  PCP:  Omie Bickers, MD   [facility] Providers Cardiologist:  Armida Lander, MD     Referring MD: Omie Bickers, MD   History of Present Illness:    Thomas [name] is a 73 y.o. male with a medical history significant for CAD status post CABG, heart failure with reduced EF that is recovered, hypertension, pulmonary hypertension, referred for arrhythmia management.     He has a history of coronary disease and underwent CABG x 2. His EF was severely depressed for a while but has recovered. He was seen by Dr. Amanda Jungling in September 2024 at which time he noted nonspecific sharp chest pain associate with palpitations.  A Zio patch showed frequent PVCs.  He does note that over time, the sharp pain has resolved.  He is continued to have intermittent palpitations unrelated to chest pain.  He had been metoprolol  XL 25 mg for some time, but this was decreased due to heart rates in the 50s associated with "fuzzy headedness."  He was referred to EP for evaluation of ventricular ectopy..  At our prior visit, cardiac MRI was ordered that showed slightly reduced LV function but no LGE.  Metoprolol  dose was increased, and he has been tolerating it well.      Today, he reports that he is doing well.  He has no complaints currently.  He feels slightly better off metoprolol  25.  Continues to have intermittent episodes of palpitations.  They do not particularly bothersome for him  EKGs/Labs/Other Studies Reviewed Today:     Echocardiogram:  TTE October 2024 EF 55 to 60%.  Mild LVH.  No significant valvular disease.   Monitors:  13 day monitor October 2024-- my interpretation Sinus rhythm 45 to 123 bpm, average 63 bpm 4% PACs, 4% PVCs Patient triggered episodes correlated with isolated PVCs. There was 1 wide-complex run lasting 18.4 seconds labeled with ventricular  tachycardia.  Stress testing:  Multiple stress tests over the years, reviewed in epic   Advanced imaging:  Cardiac MRI March 2025 LVEF 44%.  No myocardial LGE.  Normal extracellular volume percentage.  Cardiac catherization  Right and left heart cath August 09, 2021   Ost LM to Mid LM lesion is 50% stenosed.   1st Diag lesion is 70% stenosed.   Ost Cx lesion is 75% stenosed.   Prox Graft to Mid Graft lesion between Ost LAD and 1st Mrg  is 35% stenosed.   LIMA graft was visualized by angiography and is normal in caliber.   SVG graft was visualized by angiography and is normal in caliber.   The graft exhibits no disease.   LV end diastolic pressure is moderately elevated.   Hemodynamic findings consistent with mild pulmonary hypertension.  EKG:   EKG Interpretation Date/Time:  Wednesday August 29 2023 08:54:07 EDT Ventricular Rate:  57 PR Interval:  246 QRS Duration:  104 QT Interval:  428 QTC Calculation: 416 R Axis:   -72  Text Interpretation: Sinus bradycardia with 1st degree A-V block Left axis deviation Anterolateral infarct (cited on or before 28-May-2023) When compared with ECG of 28-May-2023 14:10, No significant change was found Confirmed by Marlane Silver [phone]) on 08/29/2023 9:36:37 AM     Physical Exam:    VS:  BP 108/60   Pulse (!) 57   Ht 5\' 9"  (1.753 m)   Wt 202 lb (91.6  kg)   SpO2 96%   BMI 29.83 kg/m     Wt Readings from Last 3 Encounters:  08/29/23 202 lb (91.6 kg)  08/16/23 203 lb 12.8 oz (92.4 kg)  05/28/23 207 lb 3.2 oz (94 kg)     GEN: Well nourished, well developed in no acute distress CARDIAC: RRR, no murmurs, rubs, gallops RESPIRATORY:  Normal work of breathing MUSCULOSKELETAL: no edema    ASSESSMENT & PLAN:     Frequent PVCs, NSVT Noted on monitor Associated with sensation of skipped beats No syncope, presyncope, chest pain associated with PVCs PVC burden is just 4% and symptoms have improved after increasing metoprolol  to  25 mg daily Longest run of wide-complex tachycardia lasted 18 seconds --there were no associated symptoms; no scar on CMR -- no indication for further intervention Continue metoprolol  25 mg daily  Coronary artery disease Continue rosuvastatin  40, aspirin  81 mg No recent symptoms to suggest active ischemia  CHF with recovered EF Continue spironolactone  25, metoprolol  XL 25, Entresto  97-103, empagliflozin 25  I will be happy to see Mr. Hagin again if the need arises.  As is, his PVC burden was low while wearing a monitor, and he has symptomatically improved since then with increased dose of metoprolol .  Cardiac MRI did not show substrate for reentrant VT.    Signed, Efraim Grange, MD  08/29/2023 9:38 AM    [facility]

## 2023-08-29 NOTE — Patient Instructions (Signed)
 Medication Instructions:  Your physician recommends that you continue on your current medications as directed. Please refer to the Current Medication list given to you today. *If you need a refill on your cardiac medications before your next appointment, please call your pharmacy*   Follow-Up: At [facility], you and your health needs are our priority.  As part of our continuing mission to provide you with exceptional heart care, our providers are all part of one team.  This team includes your primary Cardiologist (physician) and Advanced Practice Providers or APPs (Physician Assistants and Nurse Practitioners) who all work together to provide you with the care you need, when you need it.  Your next appointment:   As Needed   Provider:   Marlane Silver, MD    Other Instructions Follow up with Dr Amanda Jungling as already instructed

## 2023-09-21 ENCOUNTER — Other Ambulatory Visit: Payer: Self-pay | Admitting: Cardiology

## 2023-09-22 ENCOUNTER — Other Ambulatory Visit: Payer: Self-pay | Admitting: Cardiology

## 2023-09-25 ENCOUNTER — Other Ambulatory Visit: Payer: Self-pay | Admitting: Cardiology

## 2023-09-27 ENCOUNTER — Other Ambulatory Visit: Payer: Self-pay | Admitting: Cardiology

## 2023-10-10 DIAGNOSIS — E1122 Type 2 diabetes mellitus with diabetic chronic kidney disease: Secondary | ICD-10-CM | POA: Diagnosis not present

## 2023-10-10 DIAGNOSIS — E782 Mixed hyperlipidemia: Secondary | ICD-10-CM | POA: Diagnosis not present

## 2023-10-10 DIAGNOSIS — D509 Iron deficiency anemia, unspecified: Secondary | ICD-10-CM | POA: Diagnosis not present

## 2023-10-17 DIAGNOSIS — I10 Essential (primary) hypertension: Secondary | ICD-10-CM | POA: Diagnosis not present

## 2023-10-17 DIAGNOSIS — G4733 Obstructive sleep apnea (adult) (pediatric): Secondary | ICD-10-CM | POA: Diagnosis not present

## 2023-10-17 DIAGNOSIS — E1122 Type 2 diabetes mellitus with diabetic chronic kidney disease: Secondary | ICD-10-CM | POA: Diagnosis not present

## 2023-10-17 DIAGNOSIS — I2510 Atherosclerotic heart disease of native coronary artery without angina pectoris: Secondary | ICD-10-CM | POA: Diagnosis not present

## 2023-10-17 DIAGNOSIS — N401 Enlarged prostate with lower urinary tract symptoms: Secondary | ICD-10-CM | POA: Diagnosis not present

## 2023-10-17 DIAGNOSIS — I471 Supraventricular tachycardia, unspecified: Secondary | ICD-10-CM | POA: Diagnosis not present

## 2023-10-17 DIAGNOSIS — K219 Gastro-esophageal reflux disease without esophagitis: Secondary | ICD-10-CM | POA: Diagnosis not present

## 2023-10-17 DIAGNOSIS — E1140 Type 2 diabetes mellitus with diabetic neuropathy, unspecified: Secondary | ICD-10-CM | POA: Diagnosis not present

## 2023-10-17 DIAGNOSIS — E782 Mixed hyperlipidemia: Secondary | ICD-10-CM | POA: Diagnosis not present

## 2023-10-17 DIAGNOSIS — I77810 Thoracic aortic ectasia: Secondary | ICD-10-CM | POA: Diagnosis not present

## 2023-10-17 DIAGNOSIS — R945 Abnormal results of liver function studies: Secondary | ICD-10-CM | POA: Diagnosis not present

## 2023-10-17 DIAGNOSIS — I5022 Chronic systolic (congestive) heart failure: Secondary | ICD-10-CM | POA: Diagnosis not present

## 2023-10-25 DIAGNOSIS — I77819 Aortic ectasia, unspecified site: Secondary | ICD-10-CM | POA: Diagnosis not present

## 2023-10-25 DIAGNOSIS — E1142 Type 2 diabetes mellitus with diabetic polyneuropathy: Secondary | ICD-10-CM | POA: Diagnosis not present

## 2023-10-25 DIAGNOSIS — Z008 Encounter for other general examination: Secondary | ICD-10-CM | POA: Diagnosis not present

## 2023-10-25 DIAGNOSIS — I130 Hypertensive heart and chronic kidney disease with heart failure and stage 1 through stage 4 chronic kidney disease, or unspecified chronic kidney disease: Secondary | ICD-10-CM | POA: Diagnosis not present

## 2023-10-25 DIAGNOSIS — I4891 Unspecified atrial fibrillation: Secondary | ICD-10-CM | POA: Diagnosis not present

## 2023-10-25 DIAGNOSIS — I25119 Atherosclerotic heart disease of native coronary artery with unspecified angina pectoris: Secondary | ICD-10-CM | POA: Diagnosis not present

## 2023-10-25 DIAGNOSIS — I509 Heart failure, unspecified: Secondary | ICD-10-CM | POA: Diagnosis not present

## 2023-10-25 DIAGNOSIS — E785 Hyperlipidemia, unspecified: Secondary | ICD-10-CM | POA: Diagnosis not present

## 2023-10-25 DIAGNOSIS — I472 Ventricular tachycardia, unspecified: Secondary | ICD-10-CM | POA: Diagnosis not present

## 2023-10-25 DIAGNOSIS — I471 Supraventricular tachycardia, unspecified: Secondary | ICD-10-CM | POA: Diagnosis not present

## 2023-10-25 DIAGNOSIS — E1122 Type 2 diabetes mellitus with diabetic chronic kidney disease: Secondary | ICD-10-CM | POA: Diagnosis not present

## 2023-10-25 DIAGNOSIS — I272 Pulmonary hypertension, unspecified: Secondary | ICD-10-CM | POA: Diagnosis not present

## 2023-10-25 DIAGNOSIS — D6869 Other thrombophilia: Secondary | ICD-10-CM | POA: Diagnosis not present

## 2023-11-13 DIAGNOSIS — G4733 Obstructive sleep apnea (adult) (pediatric): Secondary | ICD-10-CM | POA: Diagnosis not present

## 2023-12-08 ENCOUNTER — Other Ambulatory Visit: Payer: Self-pay | Admitting: Cardiology

## 2023-12-13 ENCOUNTER — Other Ambulatory Visit (HOSPITAL_COMMUNITY): Payer: Self-pay

## 2023-12-13 ENCOUNTER — Telehealth: Payer: Self-pay | Admitting: Pharmacy Technician

## 2023-12-13 NOTE — Telephone Encounter (Signed)
    Pa was for the generic. Brand was filled at the pharmacy

## 2023-12-24 ENCOUNTER — Other Ambulatory Visit: Payer: Self-pay | Admitting: Cardiology

## 2023-12-24 DIAGNOSIS — I5022 Chronic systolic (congestive) heart failure: Secondary | ICD-10-CM | POA: Diagnosis not present

## 2023-12-24 DIAGNOSIS — Z1211 Encounter for screening for malignant neoplasm of colon: Secondary | ICD-10-CM | POA: Diagnosis not present

## 2023-12-24 DIAGNOSIS — G629 Polyneuropathy, unspecified: Secondary | ICD-10-CM | POA: Diagnosis not present

## 2023-12-24 DIAGNOSIS — K219 Gastro-esophageal reflux disease without esophagitis: Secondary | ICD-10-CM | POA: Diagnosis not present

## 2023-12-24 DIAGNOSIS — E782 Mixed hyperlipidemia: Secondary | ICD-10-CM | POA: Diagnosis not present

## 2023-12-24 DIAGNOSIS — I110 Hypertensive heart disease with heart failure: Secondary | ICD-10-CM | POA: Diagnosis not present

## 2023-12-24 DIAGNOSIS — U071 COVID-19: Secondary | ICD-10-CM | POA: Diagnosis not present

## 2023-12-24 DIAGNOSIS — I2510 Atherosclerotic heart disease of native coronary artery without angina pectoris: Secondary | ICD-10-CM | POA: Diagnosis not present

## 2023-12-24 DIAGNOSIS — G4733 Obstructive sleep apnea (adult) (pediatric): Secondary | ICD-10-CM | POA: Diagnosis not present

## 2023-12-24 DIAGNOSIS — R945 Abnormal results of liver function studies: Secondary | ICD-10-CM | POA: Diagnosis not present

## 2023-12-24 DIAGNOSIS — I471 Supraventricular tachycardia, unspecified: Secondary | ICD-10-CM | POA: Diagnosis not present

## 2023-12-24 DIAGNOSIS — I77810 Thoracic aortic ectasia: Secondary | ICD-10-CM | POA: Diagnosis not present

## 2023-12-24 DIAGNOSIS — E1122 Type 2 diabetes mellitus with diabetic chronic kidney disease: Secondary | ICD-10-CM | POA: Diagnosis not present

## 2023-12-28 LAB — COLOGUARD: COLOGUARD: NEGATIVE

## 2024-01-30 DIAGNOSIS — E782 Mixed hyperlipidemia: Secondary | ICD-10-CM | POA: Diagnosis not present

## 2024-01-30 DIAGNOSIS — E1122 Type 2 diabetes mellitus with diabetic chronic kidney disease: Secondary | ICD-10-CM | POA: Diagnosis not present

## 2024-02-05 ENCOUNTER — Other Ambulatory Visit (HOSPITAL_COMMUNITY): Payer: Self-pay | Admitting: Internal Medicine

## 2024-02-05 DIAGNOSIS — I2510 Atherosclerotic heart disease of native coronary artery without angina pectoris: Secondary | ICD-10-CM | POA: Diagnosis not present

## 2024-02-05 DIAGNOSIS — E782 Mixed hyperlipidemia: Secondary | ICD-10-CM | POA: Diagnosis not present

## 2024-02-05 DIAGNOSIS — I471 Supraventricular tachycardia, unspecified: Secondary | ICD-10-CM | POA: Diagnosis not present

## 2024-02-05 DIAGNOSIS — I5022 Chronic systolic (congestive) heart failure: Secondary | ICD-10-CM | POA: Diagnosis not present

## 2024-02-05 DIAGNOSIS — I77810 Thoracic aortic ectasia: Secondary | ICD-10-CM | POA: Diagnosis not present

## 2024-02-05 DIAGNOSIS — R0789 Other chest pain: Secondary | ICD-10-CM

## 2024-02-05 DIAGNOSIS — Z0001 Encounter for general adult medical examination with abnormal findings: Secondary | ICD-10-CM | POA: Diagnosis not present

## 2024-02-05 DIAGNOSIS — I10 Essential (primary) hypertension: Secondary | ICD-10-CM | POA: Diagnosis not present

## 2024-02-05 DIAGNOSIS — R945 Abnormal results of liver function studies: Secondary | ICD-10-CM | POA: Diagnosis not present

## 2024-02-05 DIAGNOSIS — E1122 Type 2 diabetes mellitus with diabetic chronic kidney disease: Secondary | ICD-10-CM | POA: Diagnosis not present

## 2024-02-05 DIAGNOSIS — Z0000 Encounter for general adult medical examination without abnormal findings: Secondary | ICD-10-CM | POA: Diagnosis not present

## 2024-02-05 DIAGNOSIS — E1142 Type 2 diabetes mellitus with diabetic polyneuropathy: Secondary | ICD-10-CM | POA: Diagnosis not present

## 2024-02-13 ENCOUNTER — Ambulatory Visit (HOSPITAL_COMMUNITY)
Admission: RE | Admit: 2024-02-13 | Discharge: 2024-02-13 | Disposition: A | Discharge status: Home / Self Care | Source: Ambulatory Visit | Attending: Internal Medicine | Admitting: Internal Medicine

## 2024-02-13 DIAGNOSIS — R0789 Other chest pain: Secondary | ICD-10-CM | POA: Diagnosis not present

## 2024-02-13 DIAGNOSIS — R071 Chest pain on breathing: Secondary | ICD-10-CM | POA: Diagnosis not present

## 2024-02-15 ENCOUNTER — Telehealth: Payer: Self-pay | Admitting: Cardiology

## 2024-02-15 NOTE — Telephone Encounter (Signed)
 Advised patient Thomas [name] did not previously order a echo to be done before next OV. His upcoming visit is a 6 month f/u and if provider decides to do one he will order it at that time. Patient informed and verbalized understanding of plan.

## 2024-02-15 NOTE — Telephone Encounter (Signed)
 Pt calling to ask if he should have an echocardiogram before, or after his appt with J. Branch on 11/13. Please advise.

## 2024-03-13 ENCOUNTER — Ambulatory Visit: Attending: Cardiology | Admitting: Cardiology

## 2024-03-13 ENCOUNTER — Encounter: Payer: Self-pay | Admitting: Cardiology

## 2024-03-13 VITALS — BP 118/72 | HR 64 | Ht 69.0 in | Wt 206.2 lb

## 2024-03-13 DIAGNOSIS — I10 Essential (primary) hypertension: Secondary | ICD-10-CM | POA: Diagnosis not present

## 2024-03-13 DIAGNOSIS — R002 Palpitations: Secondary | ICD-10-CM

## 2024-03-13 DIAGNOSIS — I493 Ventricular premature depolarization: Secondary | ICD-10-CM

## 2024-03-13 DIAGNOSIS — R1013 Epigastric pain: Secondary | ICD-10-CM | POA: Diagnosis not present

## 2024-03-13 DIAGNOSIS — E782 Mixed hyperlipidemia: Secondary | ICD-10-CM | POA: Diagnosis not present

## 2024-03-13 DIAGNOSIS — I5020 Unspecified systolic (congestive) heart failure: Secondary | ICD-10-CM

## 2024-03-13 NOTE — Progress Notes (Signed)
 Clinical Summary Thomas [name] is a 73 y.o.male seen today for follow up of the following medical problems.      1.Chronic HFimpEF - long history of HFpEF, however few years ago did develop systolic dysfunction 07/2020 echo: LVEF 60-65%, no WMAs, grade II DD  - 06/2021 echo: LVEF 30-35%, grade II dd, D septum, mild RV dysfunction, PASP 44 -07/2021 cath: ostial LM 50%, D1 70%, LCX osital 75%, RCA small vessel mild diffuse disease. LIMA-LAD patent, SVG sequential to LAD and Om1 patent LVEDP 22, mean PA 30   PCWP 21, CI 2.87  11/2021 echo: LVEF 40-45% - 01/2023 echo LVEF 55-60% -06/2023 cMRI: LVEF 44%, no LGE - toprol  dosing limited due to fatigue and bradycardia.    - EP had increased toprol  recently, he is tolerating dose.  - no SOB/DOE, no recent edema - compliant with meds     2. Hyperlipidemia - lipitor caused muscle aches. Simvastatin caused side effects. Currently on crestor  daily, tolerating.  - he is on rosuvastatin  79m, zetia 10mg  daily - 06/2023 TC 129 TG 129 HDL 51 LDL 55     3. CAD - history of LIMA-LAD and SVG-OM2 CABG 01/2016. LVEF 50-55% by TEE at the time - 03/2017 echo LVEF 45-50%. Grade Ii diastolic dysfunction.     08/2017 nuclear stress: PVCs, short runs NSVT. Small mild intensity mid to basal inferolatearl defect fixed most consistent with scar. LVEF 44% 10/2017 echo LVEF 50-55%, no WMAs     - metoprolol  dosing limited to fatigue and bradycardia, has only been on 12.5mg  daily. At Jan 2025 appt EP did increase to 25mg  for palpitations.    10/2019 Lexiscan  nuclear stress:no ischemia.  07/2020 echo: LVEF 60-65%, grade II dd, normal RV function, mild MR    07/2021 cath: ostial LM 50%, D1 70%, LCX osital 75%, RCA small vessel mild diffuse disease. LIMA-LAD patent, SVG sequential to LAD and Om1 patent LVEDP 22, mean PA 30   PCWP 21, CI 2.87   - some chest/epigastric heaviness at times. Can last all day long constant for hours.    4. Irregular heart  beat/PVCs/NSVT - PVCs noted on stress test  01/2023 monitor: occasional PACs, 4 runs SVT longest 6 beats. Occasional PVCs 4% burden, 24 runs of NSVT longest 18.4 seconds.  - 06/2023 cMRI: LVEF 44%, normal RV, no scar  -taking toprol  12.5mg  bid. Symptoms have improved     5. HTN - he is compliant with meds   6. Aortic dilatation - 01/2023 echo: ascedning aorta 43 mm Past Medical History:  Diagnosis Date   Acid reflux    Adenomatous colon polyp 08/2010   Anxiety    Arthritis    Chronic back pain    Coronary artery disease    Diabetes mellitus without complication (HCC)    GERD (gastroesophageal reflux disease)    Gout    Hiatal hernia    High cholesterol    HTN (hypertension)    Hyperlipidemia    Renal cyst    Large Left   Restless legs    Sleep apnea    uses cpap     Allergies  Allergen Reactions   Ambien  [Zolpidem  Tartrate] Other (See Comments)    Caused memory problems   Simvastatin     REACTION: arm numbness Patient currently on this medication (03/2012).     Current Outpatient Medications  Medication Sig Dispense Refill   alfuzosin  (UROXATRAL ) 10 MG 24 hr tablet TAKE 1 TABLET BY MOUTH EVERYDAY  AT BEDTIME 90 tablet 3   amLODipine  (NORVASC ) 5 MG tablet TAKE 1 TABLET (5 MG TOTAL) BY MOUTH DAILY. 90 tablet 2   aspirin  EC 81 MG tablet Take 1 tablet (81 mg total) by mouth daily. Swallow whole.     clotrimazole -betamethasone  (LOTRISONE ) cream Apply 1 application. topically daily as needed (irritation).     Empagliflozin-metFORMIN  HCl ER 25-1000 MG TB24 Take 1 tablet by mouth daily.     ENTRESTO  97-103 MG TAKE 1 TABLET BY MOUTH TWICE A DAY 180 tablet 2   ezetimibe (ZETIA) 10 MG tablet Take 10 mg by mouth daily.     furosemide  (LASIX ) 40 MG tablet TAKE 1.5 TABLETS (60 MG TOTAL) BY MOUTH DAILY AS NEEDED (FOR SWELLING, SHORTNESS OF BREATH). 135 tablet 1   gabapentin (NEURONTIN) 300 MG capsule Take 300 mg by mouth at bedtime. May take additional 300 mg if needed during  the day     Iron-FA-B Cmp-C-Biot-Probiotic (FUSION PLUS) CAPS Take 1 capsule by mouth daily.     isosorbide  mononitrate (IMDUR ) 30 MG 24 hr tablet TAKE 1.5 TABLETS BY MOUTH DAILY. 135 tablet 1   levocetirizine (XYZAL) 5 MG tablet Take 5 mg by mouth daily.     meloxicam (MOBIC) 7.5 MG tablet Take 7.5 mg by mouth daily as needed.     metoprolol  succinate (TOPROL -XL) 25 MG 24 hr tablet TAKE 1/2 TABLET BY MOUTH DAILY 45 tablet 1   mometasone (NASONEX) 50 MCG/ACT nasal spray Place 2 sprays into the nose at bedtime as needed (for nasal congestion).     nitroGLYCERIN  (NITROSTAT ) 0.4 MG SL tablet Place 1 tablet (0.4 mg total) under the tongue every 5 (five) minutes x 3 doses as needed for chest pain (If no relief after 3rd dose proceed to ED or call 911). 25 tablet 3   pantoprazole  (PROTONIX ) 40 MG tablet TAKE 1 TABLET BY MOUTH EVERY DAY 90 tablet 1   rosuvastatin  (CRESTOR ) 40 MG tablet TAKE 1 TABLET BY MOUTH EVERY DAY 90 tablet 1   RYBELSUS 7 MG TABS Take 7 mg by mouth daily.     spironolactone  (ALDACTONE ) 25 MG tablet TAKE 1 TABLET (25 MG TOTAL) BY MOUTH DAILY. 90 tablet 3   Current Facility-Administered Medications  Medication Dose Route Frequency Provider Last Rate Last Admin   sodium chloride  flush (NS) 0.9 % injection 3 mL  3 mL Intravenous Q12H Alvan Dorn FALCON, MD         Past Surgical History:  Procedure Laterality Date   APPENDECTOMY  1967   CARDIAC CATHETERIZATION N/A 01/31/2016   Procedure: Left Heart Cath and Coronary Angiography;  Surgeon: Victory LELON Sharps, MD;  Location: [facility] INVASIVE CV LAB;  Service: Cardiovascular;  Laterality: N/A;   CARDIOVASCULAR STRESS TEST  2004   Treadmill Stress Test   CARDIOVASCULAR STRESS TEST  2010   CHOLECYSTECTOMY  07/2006   COLONOSCOPY  08/2010   RMR: Cecal tubular adenoma removed, otherwise normal exam. next tcs 08/2015   CORONARY ARTERY BYPASS GRAFT N/A 02/28/2016   Procedure: CORONARY ARTERY BYPASS GRAFTING x two using left internal mammary artery  and right leg greater saphenous vein harvested endoscopically;  Surgeon: Dorise MARLA Fellers, MD;  Location: [facility] OR;  Service: Open Heart Surgery;  Laterality: N/A;   KNEE SURGERY  1995   Micro   RIGHT/LEFT HEART CATH AND CORONARY/GRAFT ANGIOGRAPHY N/A 08/09/2021   Procedure: RIGHT/LEFT HEART CATH AND CORONARY/GRAFT ANGIOGRAPHY;  Surgeon: Jordan, Peter M, MD;  Location: [facility] INVASIVE CV LAB;  Service: Cardiovascular;  Laterality: N/A;   TEE WITHOUT CARDIOVERSION N/A 02/28/2016   Procedure: TRANSESOPHAGEAL ECHOCARDIOGRAM (TEE);  Surgeon: Dorise MARLA Fellers, MD;  Location: [facility] OR;  Service: Open Heart Surgery;  Laterality: N/A;   UPPER GASTROINTESTINAL ENDOSCOPY  08/2010   RMR: GERD benign biopsy   VASECTOMY       Allergies  Allergen Reactions   Ambien  [Zolpidem  Tartrate] Other (See Comments)    Caused memory problems   Simvastatin     REACTION: arm numbness Patient currently on this medication (03/2012).      Family History  Problem Relation Age of Onset   Lymphoma Mother    Cancer Mother    Ulcers Father    Cancer Father    Ulcers Paternal Grandfather        Stomach ulcer, resection   Heart attack Other    Diabetes Other      Social History Mr. Brumbaugh reports that he has never smoked. He has never been exposed to tobacco smoke. He has never used smokeless tobacco. Mr. Dingley reports current alcohol use of about 2.0 standard drinks of alcohol per week.    Physical Examination Today's Vitals   03/13/24 1144  BP: 118/72  Pulse: 64  SpO2: 94%  Weight: 206 lb 3.2 oz (93.5 kg)  Height: 5' 9 (1.753 m)  PainSc: 2   PainLoc: Back   Body mass index is 30.45 kg/m.  Gen: resting comfortably, no acute distress HEENT: no scleral icterus, pupils equal round and reactive, no palptable cervical adenopathy,  CV: RRR, no m/r,g no jvd Resp: Clear to auscultation bilaterally GI: abdomen is soft, non-tender, non-distended, normal bowel sounds, no hepatosplenomegaly MSK: extremities are warm,  no edema.  Skin: warm, no rash Neuro:  no focal deficits Psych: appropriate affect   Diagnostic Studies  10/2019 nuclear stress There was no ST segment deviation noted during stress. Findings consistent with prior inferior myocardial infarction, there is no current ischemia. This is an intermediate risk study. Risk based on decreased LVEF, there is no current myocardium at jeopardy. Consider correlating LVEF with echo. The left ventricular ejection fraction is mildly decreased (44%).     07/2020 echo 1. Left ventricular ejection fraction, by estimation, is 60 to 65%. The  left ventricle has normal function. The left ventricle has no regional  wall motion abnormalities. Left ventricular diastolic parameters are  consistent with Grade II diastolic  dysfunction (pseudonormalization).   2. Right ventricular systolic function is normal. The right ventricular  size is normal. There is mildly elevated pulmonary artery systolic  pressure.   3. Left atrial size was mild to moderately dilated.   4. The mitral valve is normal in structure. Mild mitral valve  regurgitation.   5. The aortic valve is normal in structure. Aortic valve regurgitation is  not visualized.   6. The inferior vena cava is dilated in size with >50% respiratory  variability, suggesting right atrial pressure of 8 mmHg.    07/2021 cath Ost LM to Mid LM lesion is 50% stenosed.   1st Diag lesion is 70% stenosed.   Ost Cx lesion is 75% stenosed.   Prox Graft to Mid Graft lesion between Ost LAD and 1st Mrg  is 35% stenosed.   LIMA graft was visualized by angiography and is normal in caliber.   SVG graft was visualized by angiography and is normal in caliber.   The graft exhibits no disease.   LV end diastolic pressure is moderately elevated.   Hemodynamic findings consistent with mild  pulmonary hypertension.   Left main and ostial LCx obstructive disease. Moderate mid diagonal stenosis Patent LIMA to the LAD Patent SVG  to OM 1 Moderately elevated LV filling pressures. LVEDP 22 mm Hg. PCWP mean 21 mm Hg Mild pulmonary HTN mean PAP 30 mm Hg Preserved cardiac output 6 L/min   Plan: optimize medical therapy for CHF. Decrease in LV function cannot be explained by ischemia.      11/2021 echo 1. Left ventricular ejection fraction, by estimation, is 40 to 45%. The  left ventricle has mildly decreased function. The left ventricle  demonstrates global hypokinesis. There is mild left ventricular  hypertrophy. Left ventricular diastolic parameters  are indeterminate. The average left ventricular global longitudinal strain  is -17.5 %. The global longitudinal strain is normal.   2. Probable LV apical trabeculation noted, consider limited study with  Definity contrast to better exclude mural thrombus.   3. Right ventricular systolic function is normal. The right ventricular  size is normal. There is normal pulmonary artery systolic pressure. The  estimated right ventricular systolic pressure is 26.2 mmHg.   4. Left atrial size was upper normal.   5. Right atrial size was mildly dilated.   6. The mitral valve is grossly normal. Mild mitral valve regurgitation.   7. The aortic valve is tricuspid. Aortic valve regurgitation is trivial.   8. Aortic dilatation noted. There is mild dilatation of the ascending  aorta, measuring 40 mm.   9. The inferior vena cava is normal in size with greater than 50%  respiratory variability, suggesting right atrial pressure of 3 mmHg.    01/2023 echo 1. Left ventricular ejection fraction, by estimation, is 55 to 60%. The  left ventricle has normal function. The left ventricle has no regional  wall motion abnormalities. There is mild left ventricular hypertrophy.  Left ventricular diastolic parameters  were normal.   2. Right ventricular systolic function is normal. The right ventricular  size is normal.   3. The mitral valve is normal in structure. Trivial mitral valve   regurgitation. No evidence of mitral stenosis.   4. The tricuspid valve is abnormal.   5. The aortic valve is tricuspid. Aortic valve regurgitation is not  visualized. No aortic stenosis is present.   6. There is mild dilatation of the ascending aorta, measuring 43 mm.    01/2023 monitor 14 day monitor   Occasional supraventricular ectopy in the form of isolated PACs. Rare couplets, triplets. 4 runs of SVT longest 6 beats.   Occasional ventricular ectopy in the form of isolated PVCs (4%). Rare couplets, triplets. 24 runs of NSVT longest 18.4 seconds.   Triggered patient events but no diary of symptoms included. Triggered events correlated with sinus rhythm, PACs, PVCs   Rare episodes of mobitz I Ronnell) second degree block   Assessment and Plan   1.Chronic HFimpEF -- no symptoms, cotinue current meds   2. HTN -bp is at goal, continue current therapy   3. Hyperlipidemia - request pcp labs   4. CAD - no cardiac chest pains, recent cath without patent grafts - epigastric pain that can last all day long, will refer to GI   5. Palpitations/PVCs/NSVT - symptoms improved with higher toprol  dosing. Historically fatigue on higher beta blocker dosing but he is currently tolerating well.  - continue current therapy, doing well  F/u 6 months       Dorn PHEBE Ross, M.D.,

## 2024-03-13 NOTE — Patient Instructions (Signed)
Medication Instructions:  Continue all current medications.   Labwork: none  Testing/Procedures: none  Follow-Up: 6 months   Any Other Special Instructions Will Be Listed Below (If Applicable). You have been referred to GI    If you need a refill on your cardiac medications before your next appointment, please call your pharmacy.  

## 2024-03-24 ENCOUNTER — Other Ambulatory Visit: Payer: Self-pay | Admitting: Cardiology

## 2024-03-26 ENCOUNTER — Encounter: Payer: Self-pay | Admitting: Internal Medicine

## 2024-04-11 DIAGNOSIS — G4733 Obstructive sleep apnea (adult) (pediatric): Secondary | ICD-10-CM | POA: Diagnosis not present

## 2024-04-14 ENCOUNTER — Encounter: Payer: Self-pay | Admitting: Internal Medicine

## 2024-05-13 ENCOUNTER — Encounter: Payer: Self-pay | Admitting: *Deleted

## 2024-05-13 NOTE — Progress Notes (Signed)
 Thomas [name]                                          MRN: [id number]   05/13/2024   The [facility] Quality Team Specialist reviewed this patient medical record for the purposes of chart review for care gap closure. The following were reviewed: abstraction for care gap closure-controlling blood pressure.    [facility] Quality Team

## 2024-05-19 ENCOUNTER — Encounter: Payer: Self-pay | Admitting: Internal Medicine

## 2024-05-19 ENCOUNTER — Telehealth (INDEPENDENT_AMBULATORY_CARE_PROVIDER_SITE_OTHER): Payer: Self-pay

## 2024-05-19 ENCOUNTER — Ambulatory Visit: Admitting: Internal Medicine

## 2024-05-19 VITALS — BP 103/67 | HR 86 | Temp 97.5°F | Ht 69.0 in | Wt 203.3 lb

## 2024-05-19 DIAGNOSIS — K219 Gastro-esophageal reflux disease without esophagitis: Secondary | ICD-10-CM | POA: Diagnosis not present

## 2024-05-19 DIAGNOSIS — Z860100 Personal history of colon polyps, unspecified: Secondary | ICD-10-CM

## 2024-05-19 DIAGNOSIS — Z860101 Personal history of adenomatous and serrated colon polyps: Secondary | ICD-10-CM

## 2024-05-19 DIAGNOSIS — R140 Abdominal distension (gaseous): Secondary | ICD-10-CM

## 2024-05-19 DIAGNOSIS — G8929 Other chronic pain: Secondary | ICD-10-CM

## 2024-05-19 DIAGNOSIS — R1013 Epigastric pain: Secondary | ICD-10-CM | POA: Diagnosis not present

## 2024-05-19 MED ORDER — PEG 3350-KCL-NA BICARB-NACL 420 G PO SOLR: 4000.0000 mL | Freq: Once | ORAL | 0 refills | Status: DC

## 2024-05-19 NOTE — Telephone Encounter (Signed)
 Prior shara is not required

## 2024-05-19 NOTE — Progress Notes (Signed)
 "   Primary Care Physician:  Shona Norleen PEDLAR, MD Primary Gastroenterologist:  Dr. Cindie  Chief Complaint  Patient presents with   New Patient (Initial Visit)    Patient here today due to having epigastric pain on going off and on for a while. Patient also has some issues with bloating and gas. Patient is taking pantoprazole  40 mg once per day, before breakfast.     HPI:   Thomas [name] is a 74 y.o. male who presents to the clinic today from his cardiologist Dr. Alvan for evaluation.  Epigastric pain: Progressively worsening, mild to moderate in severity.  Worse after meals especially spicy foods.  Denies any dysphagia odynophagia.  Taking pantoprazole  40 mg daily which is helping some.  Continues to have breakthrough symptoms however.  No chronic NSAIDs.  Takes meloxicam on occasion.  Does note working outdoors frequently.  Unsure about tick exposure.  Reports associated abdominal bloating especially after meals.  States he can eat a small amount and feels full and bloated.  History of adenomatous colon polyps: Last colonoscopy May 2012 with 1 small tubular adenoma removed.  Had Cologuard testing in 2021 and 2025 which were both negative.  Denies any family history of colorectal malignancy.  No melena hematochezia.  No unintentional weight loss.  Past Medical History:  Diagnosis Date   Acid reflux    Adenomatous colon polyp 08/2010   Anxiety    Arthritis    Chronic back pain    Coronary artery disease    Diabetes mellitus without complication (HCC)    GERD (gastroesophageal reflux disease)    Gout    Hiatal hernia    High cholesterol    HTN (hypertension)    Hyperlipidemia    Renal cyst    Large Left   Restless legs    Sleep apnea    uses cpap    Past Surgical History:  Procedure Laterality Date   APPENDECTOMY  1967   CARDIAC CATHETERIZATION N/A 01/31/2016   Procedure: Left Heart Cath and Coronary Angiography;  Surgeon: Victory LELON Sharps, MD;  Location: [facility] INVASIVE CV LAB;   Service: Cardiovascular;  Laterality: N/A;   CARDIOVASCULAR STRESS TEST  2004   Treadmill Stress Test   CARDIOVASCULAR STRESS TEST  2010   CHOLECYSTECTOMY  07/2006   COLONOSCOPY  08/2010   RMR: Cecal tubular adenoma removed, otherwise normal exam. next tcs 08/2015   CORONARY ARTERY BYPASS GRAFT N/A 02/28/2016   Procedure: CORONARY ARTERY BYPASS GRAFTING x two using left internal mammary artery and right leg greater saphenous vein harvested endoscopically;  Surgeon: Dorise MARLA Fellers, MD;  Location: [facility] OR;  Service: Open Heart Surgery;  Laterality: N/A;   KNEE SURGERY  1995   Micro   RIGHT/LEFT HEART CATH AND CORONARY/GRAFT ANGIOGRAPHY N/A 08/09/2021   Procedure: RIGHT/LEFT HEART CATH AND CORONARY/GRAFT ANGIOGRAPHY;  Surgeon: Jordan, Peter M, MD;  Location: [facility] INVASIVE CV LAB;  Service: Cardiovascular;  Laterality: N/A;   TEE WITHOUT CARDIOVERSION N/A 02/28/2016   Procedure: TRANSESOPHAGEAL ECHOCARDIOGRAM (TEE);  Surgeon: Dorise MARLA Fellers, MD;  Location: [facility] OR;  Service: Open Heart Surgery;  Laterality: N/A;   UPPER GASTROINTESTINAL ENDOSCOPY  08/2010   RMR: GERD benign biopsy   VASECTOMY      Current Outpatient Medications  Medication Sig Dispense Refill   alfuzosin  (UROXATRAL ) 10 MG 24 hr tablet TAKE 1 TABLET BY MOUTH EVERYDAY AT BEDTIME 90 tablet 3   amLODipine  (NORVASC ) 5 MG tablet TAKE 1 TABLET (5 MG TOTAL) BY MOUTH  DAILY. 90 tablet 2   aspirin  EC 81 MG tablet Take 1 tablet (81 mg total) by mouth daily. Swallow whole.     clotrimazole -betamethasone  (LOTRISONE ) cream Apply 1 application. topically daily as needed (irritation).     Empagliflozin-metFORMIN  HCl ER 25-1000 MG TB24 Take 1 tablet by mouth daily.     ENTRESTO  97-103 MG TAKE 1 TABLET BY MOUTH TWICE A DAY 180 tablet 2   ezetimibe (ZETIA) 10 MG tablet Take 10 mg by mouth daily.     gabapentin (NEURONTIN) 300 MG capsule Take 300 mg by mouth at bedtime. May take additional 300 mg if needed during the day     isosorbide  mononitrate (IMDUR )  30 MG 24 hr tablet TAKE 1.5 TABLETS BY MOUTH DAILY. 135 tablet 1   levocetirizine (XYZAL) 5 MG tablet Take 5 mg by mouth daily. (Patient taking differently: Take 5 mg by mouth as needed.)     meloxicam (MOBIC) 7.5 MG tablet Take 7.5 mg by mouth daily as needed.     metoprolol  succinate (TOPROL -XL) 25 MG 24 hr tablet TAKE 1/2 TABLET BY MOUTH DAILY 45 tablet 3   mometasone (NASONEX) 50 MCG/ACT nasal spray Place 2 sprays into the nose at bedtime as needed (for nasal congestion).     nitroGLYCERIN  (NITROSTAT ) 0.4 MG SL tablet Place 1 tablet (0.4 mg total) under the tongue every 5 (five) minutes x 3 doses as needed for chest pain (If no relief after 3rd dose proceed to ED or call 911). 25 tablet 3   pantoprazole  (PROTONIX ) 40 MG tablet TAKE 1 TABLET BY MOUTH EVERY DAY 90 tablet 1   rosuvastatin  (CRESTOR ) 40 MG tablet TAKE 1 TABLET BY MOUTH EVERY DAY 90 tablet 3   RYBELSUS 7 MG TABS Take 7 mg by mouth daily.     spironolactone  (ALDACTONE ) 25 MG tablet TAKE 1 TABLET (25 MG TOTAL) BY MOUTH DAILY. 90 tablet 3   Iron-FA-B Cmp-C-Biot-Probiotic (FUSION PLUS) CAPS Take 1 capsule by mouth daily. (Patient not taking: Reported on 05/19/2024)     Current Facility-Administered Medications  Medication Dose Route Frequency Provider Last Rate Last Admin   sodium chloride  flush (NS) 0.9 % injection 3 mL  3 mL Intravenous Q12H Alvan Dorn FALCON, MD        Allergies as of 05/19/2024 - Review Complete 05/19/2024  Allergen Reaction Noted   Ambien  [zolpidem  tartrate] Other (See Comments)    Simvastatin  12/03/2009    Family History  Problem Relation Age of Onset   Lymphoma Mother    Cancer Mother    Ulcers Father    Cancer Father    Ulcers Paternal Grandfather        Stomach ulcer, resection   Heart attack Other    Diabetes Other     Social History   Socioeconomic History   Marital status: Divorced    Spouse name: Not on file   Number of children: 3   Years of education: Not on file   Highest  education level: Not on file  Occupational History   Occupation: retired; Architect: RETIRED  Tobacco Use   Smoking status: Never    Passive exposure: Never   Smokeless tobacco: Never  Vaping Use   Vaping status: Never Used  Substance and Sexual Activity   Alcohol use: Yes    Alcohol/week: 2.0 standard drinks of alcohol    Types: 2 Standard drinks or equivalent per week    Comment: social   Drug use: No  Sexual activity: Not on file  Other Topics Concern   Not on file  Social History Narrative   14 years education   Married to [employer]   Has 3 children   Drinks caffeine   Lives in [place]   No regular exercise   Social Drivers of Health   Tobacco Use: Low Risk (05/19/2024)   Patient History    Smoking Tobacco Use: Never    Smokeless Tobacco Use: Never    Passive Exposure: Never  Financial Resource Strain: Not on file  Food Insecurity: Not on file  Transportation Needs: Not on file  Physical Activity: Not on file  Stress: Not on file  Social Connections: Unknown (09/12/2021)   Received from [facility]   Social Network    Social Network: Not on file  Intimate Partner Violence: Unknown (08/04/2021)   Received from [facility]   HITS    Physically Hurt: Not on file    Insult or Talk Down To: Not on file    Threaten Physical Harm: Not on file    Scream or Curse: Not on file  Depression (EYV7-0): Not on file  Alcohol Screen: Not on file  Housing: Not on file  Utilities: Not on file  Health Literacy: Not on file    Subjective: Review of Systems  Constitutional:  Negative for chills and fever.  HENT:  Negative for congestion and hearing loss.   Eyes:  Negative for blurred vision and double vision.  Respiratory:  Negative for cough and shortness of breath.   Cardiovascular:  Negative for chest pain and palpitations.  Gastrointestinal:  Positive for abdominal pain and heartburn. Negative for blood in stool, constipation, diarrhea, melena and  vomiting.  Genitourinary:  Negative for dysuria and urgency.  Musculoskeletal:  Negative for joint pain and myalgias.  Skin:  Negative for itching and rash.  Neurological:  Negative for dizziness and headaches.  Psychiatric/Behavioral:  Negative for depression. The patient is not nervous/anxious.        Objective: BP 103/67 (BP Location: Left Arm, Patient Position: Sitting, Cuff Size: Large)   Pulse 86   Temp (!) 97.5 F (36.4 C) (Temporal)   Ht 5' 9 (1.753 m)   Wt 203 lb 4.8 oz (92.2 kg)   BMI 30.02 kg/m  Physical Exam Constitutional:      Appearance: Normal appearance.  HENT:     Head: Normocephalic and atraumatic.  Eyes:     Extraocular Movements: Extraocular movements intact.     Conjunctiva/sclera: Conjunctivae normal.  Cardiovascular:     Rate and Rhythm: Normal rate and regular rhythm.  Pulmonary:     Effort: Pulmonary effort is normal.     Breath sounds: Normal breath sounds.  Abdominal:     General: Bowel sounds are normal.     Palpations: Abdomen is soft.  Musculoskeletal:        General: Normal range of motion.     Cervical back: Normal range of motion and neck supple.  Skin:    General: Skin is warm.  Neurological:     General: No focal deficit present.     Mental Status: He is alert and oriented to person, place, and time.  Psychiatric:        Mood and Affect: Mood normal.        Behavior: Behavior normal.      Assessment/Plan:  1.  Epigastric pain, GERD, abdominal bloating- Will schedule for EGD to evaluate for peptic ulcer disease, esophagitis, gastritis, H. Pylori, duodenitis,  or other. Will also evaluate for esophageal stricture, Schatzki's ring, esophageal web or other.   The risks including infection, bleed, or perforation as well as benefits, limitations, alternatives and imponderables have been reviewed with the patient. Potential for esophageal dilation, biopsy, etc. have also been reviewed.  Questions have been answered. All parties  agreeable.  Blood work to screen for celiac disease as well as alpha gal syndrome.  Continue pantoprazole  daily, may make adjustments pending endoscopic findings.  2.  History of adenomatous colon polyps-last colonoscopy 2012 with 1 small tubular adenoma removed.  Has completed Cologuard x2 2021 and 2025 both of which were negative.  Discussed with patient today that given his history of adenomatous colon polyp prior, he is not candidate for Cologuard testing.  Discussed with him that the likelihood that he has colorectal cancer is low given his negative testing though would still recommend surveillance colonoscopies.  He is concerned about insurance coverage.  He would like to hold off for now.  I discussed with him by not performing surveillance colonoscopy we could be missing polyps or colon malignancy (though unlikely) and he understands.  Follow-up after EGD.  05/19/2024 3:33 PM    "

## 2024-05-19 NOTE — Telephone Encounter (Signed)
 Spoke with patient in the office, scheudled EGD for 06/03/2024 at 11am. Instructions given to patient.

## 2024-05-19 NOTE — Patient Instructions (Signed)
 I am going to order blood work at [facility] to screen you for celiac disease as well as alpha gal syndrome.  Will schedule you for upper endoscopy to further evaluate your symptoms.  Continue on pantoprazole  daily.  It was very nice meeting you today.  Dr. Cindie

## 2024-05-21 LAB — ALPHA-GAL PANEL
Allergen Lamb IgE: 1.41 kU/L — AB
Beef IgE: 2.21 kU/L — AB
IgE (Immunoglobulin E), Serum: 739 [IU]/mL — AB (ref 6–495)
O215-IgE Alpha-Gal: 6.33 kU/L — AB
Pork IgE: 0.77 kU/L — AB

## 2024-05-21 LAB — CELIAC DISEASE PANEL
Immunoglobulin A, (IgA) QN, Serum: 216 mg/dL (ref 61–437)
t-Transglutaminase (tTG) IgA: <2 U/mL (ref 0–3)

## 2024-05-23 ENCOUNTER — Ambulatory Visit: Payer: Self-pay | Admitting: Internal Medicine

## 2024-05-25 ENCOUNTER — Other Ambulatory Visit: Payer: Self-pay | Admitting: Cardiology

## 2024-05-28 ENCOUNTER — Other Ambulatory Visit: Payer: Self-pay | Admitting: Cardiology

## 2024-05-30 ENCOUNTER — Telehealth: Payer: Self-pay | Admitting: *Deleted

## 2024-05-30 ENCOUNTER — Encounter (HOSPITAL_COMMUNITY): Admission: RE | Admit: 2024-05-30 | Source: Ambulatory Visit

## 2024-05-30 NOTE — Pre-Procedure Instructions (Signed)
 Attempted pre-op phone call. No VM set up.

## 2024-05-30 NOTE — Telephone Encounter (Signed)
 Spoke with pt and procedure rescheduled with Dr. Cindie to 2/17. Aware will send new instructions to his mychart.

## 2024-06-12 ENCOUNTER — Other Ambulatory Visit (HOSPITAL_COMMUNITY)

## 2024-06-17 ENCOUNTER — Ambulatory Visit (HOSPITAL_COMMUNITY): Admission: RE | Admit: 2024-06-17 | Source: Home / Self Care | Admitting: Internal Medicine

## 2024-06-17 ENCOUNTER — Encounter (HOSPITAL_COMMUNITY): Admission: RE | Payer: Self-pay | Source: Home / Self Care
